# Patient Record
Sex: Female | Born: 1950 | Race: White | Hispanic: No | Marital: Married | Smoking: Never smoker
Health system: Southern US, Community
[De-identification: ages and names within clinical notes are randomized; demographics above are authoritative.]

## PROBLEM LIST (undated history)

## (undated) DIAGNOSIS — E785 Hyperlipidemia, unspecified: Secondary | ICD-10-CM

## (undated) DIAGNOSIS — F419 Anxiety disorder, unspecified: Secondary | ICD-10-CM

## (undated) DIAGNOSIS — S93409A Sprain of unspecified ligament of unspecified ankle, initial encounter: Secondary | ICD-10-CM

## (undated) DIAGNOSIS — F329 Major depressive disorder, single episode, unspecified: Secondary | ICD-10-CM

## (undated) DIAGNOSIS — S52509A Unspecified fracture of the lower end of unspecified radius, initial encounter for closed fracture: Secondary | ICD-10-CM

## (undated) DIAGNOSIS — R7303 Prediabetes: Secondary | ICD-10-CM

## (undated) DIAGNOSIS — G4733 Obstructive sleep apnea (adult) (pediatric): Secondary | ICD-10-CM

## (undated) DIAGNOSIS — Z923 Personal history of irradiation: Secondary | ICD-10-CM

## (undated) DIAGNOSIS — J189 Pneumonia, unspecified organism: Secondary | ICD-10-CM

## (undated) DIAGNOSIS — E119 Type 2 diabetes mellitus without complications: Secondary | ICD-10-CM

## (undated) DIAGNOSIS — Z1379 Encounter for other screening for genetic and chromosomal anomalies: Secondary | ICD-10-CM

## (undated) DIAGNOSIS — R51 Headache: Secondary | ICD-10-CM

## (undated) DIAGNOSIS — F32A Depression, unspecified: Secondary | ICD-10-CM

## (undated) DIAGNOSIS — Z972 Presence of dental prosthetic device (complete) (partial): Secondary | ICD-10-CM

## (undated) DIAGNOSIS — M1990 Unspecified osteoarthritis, unspecified site: Secondary | ICD-10-CM

## (undated) DIAGNOSIS — K8689 Other specified diseases of pancreas: Secondary | ICD-10-CM

## (undated) DIAGNOSIS — E538 Deficiency of other specified B group vitamins: Secondary | ICD-10-CM

## (undated) DIAGNOSIS — R5382 Chronic fatigue, unspecified: Secondary | ICD-10-CM

## (undated) DIAGNOSIS — G4730 Sleep apnea, unspecified: Secondary | ICD-10-CM

## (undated) DIAGNOSIS — E039 Hypothyroidism, unspecified: Secondary | ICD-10-CM

## (undated) DIAGNOSIS — K219 Gastro-esophageal reflux disease without esophagitis: Secondary | ICD-10-CM

## (undated) DIAGNOSIS — Z8719 Personal history of other diseases of the digestive system: Secondary | ICD-10-CM

## (undated) DIAGNOSIS — R519 Headache, unspecified: Secondary | ICD-10-CM

## (undated) DIAGNOSIS — F909 Attention-deficit hyperactivity disorder, unspecified type: Secondary | ICD-10-CM

## (undated) DIAGNOSIS — C50919 Malignant neoplasm of unspecified site of unspecified female breast: Secondary | ICD-10-CM

## (undated) DIAGNOSIS — Z79899 Other long term (current) drug therapy: Secondary | ICD-10-CM

## (undated) DIAGNOSIS — D649 Anemia, unspecified: Secondary | ICD-10-CM

## (undated) DIAGNOSIS — K529 Noninfective gastroenteritis and colitis, unspecified: Secondary | ICD-10-CM

## (undated) HISTORY — DX: Malignant neoplasm of unspecified site of unspecified female breast: C50.919

## (undated) HISTORY — PX: DILATION AND CURETTAGE OF UTERUS: SHX78

## (undated) HISTORY — PX: SINUS SURGERY WITH INSTATRAK: SHX5215

## (undated) HISTORY — PX: COLONOSCOPY: SHX174

## (undated) HISTORY — DX: Anemia, unspecified: D64.9

## (undated) HISTORY — DX: Encounter for other screening for genetic and chromosomal anomalies: Z13.79

## (undated) HISTORY — DX: Attention-deficit hyperactivity disorder, unspecified type: F90.9

## (undated) HISTORY — DX: Type 2 diabetes mellitus without complications: E11.9

---

## 1975-05-02 HISTORY — PX: APPENDECTOMY: SHX54

## 1981-05-01 HISTORY — PX: ABDOMINAL HYSTERECTOMY: SHX81

## 1989-05-01 HISTORY — PX: PATELLA FRACTURE SURGERY: SHX735

## 2004-04-04 ENCOUNTER — Emergency Department: Payer: Self-pay | Admitting: Emergency Medicine

## 2004-09-15 ENCOUNTER — Ambulatory Visit: Payer: Self-pay | Admitting: Family Medicine

## 2004-10-11 ENCOUNTER — Ambulatory Visit: Payer: Self-pay | Admitting: Otolaryngology

## 2005-06-06 ENCOUNTER — Other Ambulatory Visit: Payer: Self-pay

## 2005-06-07 ENCOUNTER — Inpatient Hospital Stay: Payer: Self-pay | Admitting: Internal Medicine

## 2005-06-09 ENCOUNTER — Other Ambulatory Visit: Payer: Self-pay

## 2005-06-27 ENCOUNTER — Encounter: Admission: RE | Admit: 2005-06-27 | Discharge: 2005-06-27 | Payer: Self-pay | Admitting: Neurology

## 2005-09-13 ENCOUNTER — Ambulatory Visit: Payer: Self-pay | Admitting: Family Medicine

## 2006-09-18 ENCOUNTER — Ambulatory Visit: Payer: Self-pay | Admitting: Family Medicine

## 2007-08-22 ENCOUNTER — Emergency Department: Payer: Self-pay | Admitting: Unknown Physician Specialty

## 2008-02-27 ENCOUNTER — Ambulatory Visit: Payer: Self-pay | Admitting: Family Medicine

## 2009-10-14 ENCOUNTER — Ambulatory Visit: Payer: Self-pay | Admitting: Family Medicine

## 2010-03-15 ENCOUNTER — Ambulatory Visit: Payer: Self-pay | Admitting: Gastroenterology

## 2011-01-07 ENCOUNTER — Emergency Department: Payer: Self-pay | Admitting: Emergency Medicine

## 2012-04-11 ENCOUNTER — Emergency Department: Payer: Self-pay | Admitting: Emergency Medicine

## 2013-10-06 DIAGNOSIS — R413 Other amnesia: Secondary | ICD-10-CM | POA: Insufficient documentation

## 2013-10-06 DIAGNOSIS — R269 Unspecified abnormalities of gait and mobility: Secondary | ICD-10-CM | POA: Insufficient documentation

## 2013-10-06 DIAGNOSIS — R4701 Aphasia: Secondary | ICD-10-CM | POA: Insufficient documentation

## 2013-10-20 ENCOUNTER — Ambulatory Visit: Payer: Self-pay | Admitting: Neurology

## 2014-04-06 ENCOUNTER — Encounter: Payer: Self-pay | Admitting: Neurology

## 2014-05-01 ENCOUNTER — Encounter: Payer: Self-pay | Admitting: Neurology

## 2014-06-01 ENCOUNTER — Encounter: Payer: Self-pay | Admitting: Neurology

## 2014-06-30 ENCOUNTER — Encounter
Admit: 2014-06-30 | Disposition: A | Discharge status: Home / Self Care | Payer: Self-pay | Attending: Neurology | Admitting: Neurology

## 2014-07-17 DIAGNOSIS — M25512 Pain in left shoulder: Secondary | ICD-10-CM | POA: Insufficient documentation

## 2014-08-25 DIAGNOSIS — E538 Deficiency of other specified B group vitamins: Secondary | ICD-10-CM | POA: Insufficient documentation

## 2014-08-26 DIAGNOSIS — M7502 Adhesive capsulitis of left shoulder: Secondary | ICD-10-CM | POA: Insufficient documentation

## 2014-08-26 DIAGNOSIS — M7581 Other shoulder lesions, right shoulder: Secondary | ICD-10-CM | POA: Insufficient documentation

## 2014-08-28 ENCOUNTER — Other Ambulatory Visit: Payer: Self-pay | Admitting: Surgery

## 2014-08-28 DIAGNOSIS — M7582 Other shoulder lesions, left shoulder: Principal | ICD-10-CM

## 2014-08-28 DIAGNOSIS — M75122 Complete rotator cuff tear or rupture of left shoulder, not specified as traumatic: Secondary | ICD-10-CM

## 2014-08-28 DIAGNOSIS — M7502 Adhesive capsulitis of left shoulder: Secondary | ICD-10-CM

## 2014-08-28 DIAGNOSIS — M778 Other enthesopathies, not elsewhere classified: Secondary | ICD-10-CM

## 2014-09-03 ENCOUNTER — Ambulatory Visit
Admission: RE | Admit: 2014-09-03 | Discharge: 2014-09-03 | Disposition: A | Discharge status: Home / Self Care | Payer: BLUE CROSS/BLUE SHIELD | Source: Ambulatory Visit | Attending: Surgery | Admitting: Surgery

## 2014-09-03 DIAGNOSIS — M778 Other enthesopathies, not elsewhere classified: Secondary | ICD-10-CM

## 2014-09-03 DIAGNOSIS — M7582 Other shoulder lesions, left shoulder: Secondary | ICD-10-CM | POA: Diagnosis present

## 2014-09-03 DIAGNOSIS — M75122 Complete rotator cuff tear or rupture of left shoulder, not specified as traumatic: Secondary | ICD-10-CM

## 2014-09-03 DIAGNOSIS — M7552 Bursitis of left shoulder: Secondary | ICD-10-CM | POA: Diagnosis not present

## 2014-09-03 DIAGNOSIS — M7502 Adhesive capsulitis of left shoulder: Secondary | ICD-10-CM

## 2015-01-01 ENCOUNTER — Other Ambulatory Visit: Payer: Self-pay | Admitting: Family Medicine

## 2015-01-01 DIAGNOSIS — Z780 Asymptomatic menopausal state: Secondary | ICD-10-CM

## 2015-01-05 ENCOUNTER — Other Ambulatory Visit: Payer: Self-pay | Admitting: Family Medicine

## 2015-01-05 DIAGNOSIS — Z1231 Encounter for screening mammogram for malignant neoplasm of breast: Secondary | ICD-10-CM

## 2015-01-13 ENCOUNTER — Ambulatory Visit
Admission: RE | Admit: 2015-01-13 | Discharge: 2015-01-13 | Disposition: A | Discharge status: Home / Self Care | Payer: BLUE CROSS/BLUE SHIELD | Source: Ambulatory Visit | Attending: Family Medicine | Admitting: Family Medicine

## 2015-01-13 DIAGNOSIS — Z13820 Encounter for screening for osteoporosis: Secondary | ICD-10-CM | POA: Diagnosis present

## 2015-01-13 DIAGNOSIS — M8580 Other specified disorders of bone density and structure, unspecified site: Secondary | ICD-10-CM | POA: Insufficient documentation

## 2015-01-13 DIAGNOSIS — Z1231 Encounter for screening mammogram for malignant neoplasm of breast: Secondary | ICD-10-CM | POA: Insufficient documentation

## 2015-01-13 DIAGNOSIS — Z780 Asymptomatic menopausal state: Secondary | ICD-10-CM | POA: Diagnosis present

## 2015-02-05 ENCOUNTER — Ambulatory Visit
Admission: RE | Admit: 2015-02-05 | Discharge: 2015-02-05 | Disposition: A | Discharge status: Home / Self Care | Payer: BLUE CROSS/BLUE SHIELD | Source: Ambulatory Visit | Attending: Gastroenterology | Admitting: Gastroenterology

## 2015-02-05 ENCOUNTER — Encounter: Payer: Self-pay | Admitting: Anesthesiology

## 2015-02-05 ENCOUNTER — Encounter
Admission: RE | Disposition: A | Discharge status: Home / Self Care | Payer: Self-pay | Source: Ambulatory Visit | Attending: Gastroenterology

## 2015-02-05 ENCOUNTER — Ambulatory Visit: Payer: BLUE CROSS/BLUE SHIELD | Admitting: Anesthesiology

## 2015-02-05 DIAGNOSIS — E785 Hyperlipidemia, unspecified: Secondary | ICD-10-CM | POA: Diagnosis not present

## 2015-02-05 DIAGNOSIS — Z803 Family history of malignant neoplasm of breast: Secondary | ICD-10-CM | POA: Diagnosis not present

## 2015-02-05 DIAGNOSIS — F329 Major depressive disorder, single episode, unspecified: Secondary | ICD-10-CM | POA: Insufficient documentation

## 2015-02-05 DIAGNOSIS — E538 Deficiency of other specified B group vitamins: Secondary | ICD-10-CM | POA: Diagnosis not present

## 2015-02-05 DIAGNOSIS — Z91041 Radiographic dye allergy status: Secondary | ICD-10-CM | POA: Insufficient documentation

## 2015-02-05 DIAGNOSIS — Z881 Allergy status to other antibiotic agents status: Secondary | ICD-10-CM | POA: Diagnosis not present

## 2015-02-05 DIAGNOSIS — K210 Gastro-esophageal reflux disease with esophagitis: Secondary | ICD-10-CM | POA: Insufficient documentation

## 2015-02-05 DIAGNOSIS — Z882 Allergy status to sulfonamides status: Secondary | ICD-10-CM | POA: Insufficient documentation

## 2015-02-05 DIAGNOSIS — G4730 Sleep apnea, unspecified: Secondary | ICD-10-CM | POA: Diagnosis not present

## 2015-02-05 DIAGNOSIS — K2970 Gastritis, unspecified, without bleeding: Secondary | ICD-10-CM | POA: Insufficient documentation

## 2015-02-05 DIAGNOSIS — Z90710 Acquired absence of both cervix and uterus: Secondary | ICD-10-CM | POA: Insufficient documentation

## 2015-02-05 DIAGNOSIS — Z885 Allergy status to narcotic agent status: Secondary | ICD-10-CM | POA: Diagnosis not present

## 2015-02-05 DIAGNOSIS — R1310 Dysphagia, unspecified: Secondary | ICD-10-CM | POA: Diagnosis present

## 2015-02-05 DIAGNOSIS — E039 Hypothyroidism, unspecified: Secondary | ICD-10-CM | POA: Insufficient documentation

## 2015-02-05 DIAGNOSIS — K449 Diaphragmatic hernia without obstruction or gangrene: Secondary | ICD-10-CM | POA: Diagnosis not present

## 2015-02-05 HISTORY — DX: Sleep apnea, unspecified: G47.30

## 2015-02-05 HISTORY — PX: SAVORY DILATION: SHX5439

## 2015-02-05 HISTORY — DX: Headache, unspecified: R51.9

## 2015-02-05 HISTORY — DX: Hyperlipidemia, unspecified: E78.5

## 2015-02-05 HISTORY — PX: ESOPHAGOGASTRODUODENOSCOPY (EGD) WITH PROPOFOL: SHX5813

## 2015-02-05 HISTORY — DX: Deficiency of other specified B group vitamins: E53.8

## 2015-02-05 HISTORY — DX: Depression, unspecified: F32.A

## 2015-02-05 HISTORY — DX: Gastro-esophageal reflux disease without esophagitis: K21.9

## 2015-02-05 HISTORY — DX: Major depressive disorder, single episode, unspecified: F32.9

## 2015-02-05 HISTORY — DX: Headache: R51

## 2015-02-05 HISTORY — DX: Hypothyroidism, unspecified: E03.9

## 2015-02-05 SURGERY — ESOPHAGOGASTRODUODENOSCOPY (EGD) WITH PROPOFOL
Anesthesia: General

## 2015-02-05 MED ORDER — MIDAZOLAM HCL 2 MG/2ML IJ SOLN
INTRAMUSCULAR | Status: DC | PRN
Start: 2015-02-05 — End: 2015-02-05
  Administered 2015-02-05: 1 mg via INTRAVENOUS

## 2015-02-05 MED ORDER — GLYCOPYRROLATE 0.2 MG/ML IJ SOLN
INTRAMUSCULAR | Status: DC | PRN
  Administered 2015-02-05: 0.1 mg via INTRAVENOUS

## 2015-02-05 MED ORDER — FENTANYL CITRATE (PF) 100 MCG/2ML IJ SOLN
INTRAMUSCULAR | Status: DC | PRN
  Administered 2015-02-05: 50 ug via INTRAVENOUS

## 2015-02-05 MED ORDER — SODIUM CHLORIDE 0.9 % IV SOLN
INTRAVENOUS | Status: DC
Start: 2015-02-05 — End: 2015-02-05
  Administered 2015-02-05: 11:00:00 via INTRAVENOUS

## 2015-02-05 MED ORDER — LIDOCAINE HCL (CARDIAC) 20 MG/ML IV SOLN
INTRAVENOUS | Status: DC | PRN
  Administered 2015-02-05: 50 mg via INTRAVENOUS

## 2015-02-05 MED ORDER — PROPOFOL 500 MG/50ML IV EMUL
INTRAVENOUS | Status: DC | PRN
  Administered 2015-02-05: 120 ug/kg/min via INTRAVENOUS

## 2015-02-05 MED ORDER — EPHEDRINE SULFATE 50 MG/ML IJ SOLN
INTRAMUSCULAR | Status: DC | PRN
  Administered 2015-02-05: 5 mg via INTRAVENOUS

## 2015-02-05 NOTE — Op Note (Signed)
[facility] Patient Name: April Leblanc Procedure Date: 02/05/2015 11:17 AM MRN: [id number] Account #: [id number] Date of Birth: Aug 15, 1950 Admit Type: Outpatient Age: 64 Room: [facility] ENDO ROOM 2 Gender: Female Note Status: Finalized Procedure:         Upper GI endoscopy Indications:       Dysphagia, Heartburn, Suspected esophageal reflux Patient Profile:   This is a 64 year old female. Providers:         Gerrit Heck. Rayann Heman, MD Referring MD:      Dr Celesta Gentile Medicines:         Propofol per Anesthesia Complications:     No immediate complications. Procedure:         Pre-Anesthesia Assessment:                    - Prior to the procedure, a History and Physical was                     performed, and patient medications, allergies and                     sensitivities were reviewed. The patient's tolerance of                     previous anesthesia was reviewed.                    After obtaining informed consent, the endoscope was passed                     under direct vision. Throughout the procedure, the                     patient's blood pressure, pulse, and oxygen saturations                     were monitored continuously. The Endoscope was introduced                     through the mouth, and advanced to the second part of                     duodenum. The upper GI endoscopy was accomplished without                     difficulty. The patient tolerated the procedure well. Findings:      A medium-sized hiatus hernia was present.      LA Grade A (one or more mucosal breaks less than 5 mm, not extending       between tops of 2 mucosal folds) esophagitis was found at the       gastroesophageal junction. Biopsies were taken with a cold forceps for       histology.      Localized mild mucosal abnormality characterized by discoloration and       granularity was found in the upper third of the esophagus. Biopsies were       taken with a cold  forceps for histology.      The stomach was normal.      The examined duodenum was normal. Impression:        - Medium-sized hiatus hernia.                    - LA Grade A reflux esophagitis. Biopsied.                    -  Discolored, granular mucosa in the esophagus. Biopsied.                    - Normal stomach.                    - Normal examined duodenum. Recommendation:    - Observe patient in GI recovery unit.                    - Resume regular diet.                    - Continue present medications.                    - Await pathology results.                    - Return to GI clinic.                    - Start zantac 150 mg at bedtime                    - Obtain esophageal manometry and 24 hour pH if surgical                     repair of hiatal hernia is being considered.                    - The findings and recommendations were discussed with the                     patient.                    - The findings and recommendations were discussed with the                     patient's family. Procedure Code(s): --- Professional ---                    [phone], Esophagogastroduodenoscopy, flexible, transoral;                     with biopsy, single or multiple CPT copyright 2014 American Medical Association. All rights reserved. The codes documented in this report are preliminary and upon coder review may  be revised to meet current compliance requirements. Mellody Life, MD 02/05/2015 11:37:14 AM This report has been signed electronically. Number of Addenda: 0 Note Initiated On: 02/05/2015 11:17 AM      [facility]

## 2015-02-05 NOTE — Anesthesia Postprocedure Evaluation (Signed)
  Anesthesia Post-op Note  Patient: April Leblanc  Procedure(s) Performed: Procedure(s): ESOPHAGOGASTRODUODENOSCOPY (EGD) WITH PROPOFOL (N/A) SAVORY DILATION (N/A)  Anesthesia type:General  Patient location: PACU  Post pain: Pain level controlled  Post assessment: Post-op Vital signs reviewed, Patient's Cardiovascular Status Stable, Respiratory Function Stable, Patent Airway and No signs of Nausea or vomiting  Post vital signs: Reviewed and stable  Last Vitals:  Filed Vitals:   02/05/15 1210  BP: 119/79  Pulse: 94  Temp:   Resp: 16    Level of consciousness: awake, alert  and patient cooperative  Complications: No apparent anesthesia complications

## 2015-02-05 NOTE — Anesthesia Procedure Notes (Signed)
Performed by: COOK-MARTIN, Lonell Stamos Pre-anesthesia Checklist: Patient identified, Emergency Drugs available, Suction available, Patient being monitored and Timeout performed Patient Re-evaluated:Patient Re-evaluated prior to inductionOxygen Delivery Method: Nasal cannula Preoxygenation: Pre-oxygenation with 100% oxygen Intubation Type: IV induction Airway Equipment and Method: Bite block Placement Confirmation: CO2 detector and positive ETCO2     

## 2015-02-05 NOTE — H&P (Signed)
  Primary Care Physician:  Chrisandra Carota, MD  Pre-Procedure History & Physical: HPI:  April Leblanc is a 64 y.o. female is here for an endoscopy.   Past Medical History  Diagnosis Date  . Hypothyroidism   . Sleep apnea   . GERD (gastroesophageal reflux disease)   . Headache   . Depression   . Vitamin B 12 deficiency   . Hyperlipidemia     Past Surgical History  Procedure Laterality Date  . Abdominal hysterectomy    . Colonoscopy    . Sinus surgery with instatrak    . Cesarean section      Prior to Admission medications   Medication Sig Start Date End Date Taking? Authorizing Provider  atorvastatin (LIPITOR) 40 MG tablet Take 40 mg by mouth daily.   Yes Historical Provider, MD  esomeprazole (NEXIUM) 20 MG capsule Take 20 mg by mouth daily at 12 noon.   Yes Historical Provider, MD  Eszopiclone 3 MG TABS Take 3 mg by mouth at bedtime. Take immediately before bedtime   Yes Historical Provider, MD  fluticasone (FLONASE) 50 MCG/ACT nasal spray Place 2 sprays into both nostrils daily.   Yes Historical Provider, MD  levothyroxine (SYNTHROID, LEVOTHROID) 137 MCG tablet Take 137 mcg by mouth daily before breakfast.   Yes Historical Provider, MD  montelukast (SINGULAIR) 10 MG tablet Take 10 mg by mouth at bedtime.   Yes Historical Provider, MD  oxybutynin (DITROPAN-XL) 10 MG 24 hr tablet Take 10 mg by mouth at bedtime.   Yes Historical Provider, MD  Vortioxetine HBr (TRINTELLIX) 20 MG TABS Take 20 mg by mouth daily.   Yes Historical Provider, MD    Allergies as of 01/06/2015  . (Not on File)    Family History  Problem Relation Age of Onset  . Breast cancer Maternal Aunt   . Breast cancer Cousin   . Breast cancer Cousin     Social History   Social History  . Marital Status: Married    Spouse Name: N/A  . Number of Children: N/A  . Years of Education: N/A   Occupational History  . Not on file.   Social History Main Topics  . Smoking status: Never Smoker   .  Smokeless tobacco: Not on file  . Alcohol Use: Not on file  . Drug Use: Not on file  . Sexual Activity: Not on file   Other Topics Concern  . Not on file   Social History Narrative     Physical Exam: BP 134/88 mmHg  Pulse 77  Temp(Src) 98.1 F (36.7 C) (Oral)  Resp 17  Ht 5\' 7"  (1.702 m)  Wt 79.833 kg (176 lb)  BMI 27.56 kg/m2  SpO2 99% General:   Alert,  pleasant and cooperative in NAD Head:  Normocephalic and atraumatic. Neck:  Supple; no masses or thyromegaly. Lungs:  Clear throughout to auscultation.    Heart:  Regular rate and rhythm. Abdomen:  Soft, nontender and nondistended. Normal bowel sounds, without guarding, and without rebound.   Neurologic:  Alert and  oriented x4;  grossly normal neurologically.  Impression/Plan: April Leblanc is here for an endoscopy to be performed for GERD, dysphagia  Risks, benefits, limitations, and alternatives regarding  endoscopy have been reviewed with the patient.  Questions have been answered.  All parties agreeable.   Josefine Class, MD  02/05/2015, 11:16 AM

## 2015-02-05 NOTE — Discharge Instructions (Signed)

## 2015-02-05 NOTE — Anesthesia Preprocedure Evaluation (Signed)
Anesthesia Evaluation  Patient identified by MRN, date of birth, ID band Patient awake    Reviewed: Allergy & Precautions, H&P , NPO status , Patient's Chart, lab work & pertinent test results  History of Anesthesia Complications Negative for: history of anesthetic complications  Airway Mallampati: III  TM Distance: >3 FB Neck ROM: limited    Dental  (+) Poor Dentition, Chipped, Missing, Partial Lower   Pulmonary neg shortness of breath, sleep apnea ,    Pulmonary exam normal breath sounds clear to auscultation       Cardiovascular Exercise Tolerance: Good (-) Past MI negative cardio ROS Normal cardiovascular exam Rhythm:regular Rate:Normal     Neuro/Psych  Headaches, PSYCHIATRIC DISORDERS Depression    GI/Hepatic Neg liver ROS, GERD  ,  Endo/Other  Hypothyroidism   Renal/GU negative Renal ROS  negative genitourinary   Musculoskeletal   Abdominal   Peds  Hematology negative hematology ROS (+)   Anesthesia Other Findings Past Medical History:   Hypothyroidism                                               Sleep apnea                                                  GERD (gastroesophageal reflux disease)                       Headache                                                     Depression                                                   Vitamin B 12 deficiency                                      Hyperlipidemia                                              BMI    Body Mass Index   27.55 kg/m 2      Reproductive/Obstetrics negative OB ROS                             Anesthesia Physical Anesthesia Plan  ASA: III  Anesthesia Plan: General   Post-op Pain Management:    Induction:   Airway Management Planned:   Additional Equipment:   Intra-op Plan:   Post-operative Plan:   Informed Consent: I have reviewed the patients History and Physical, chart, labs and  discussed the procedure including the risks, benefits and alternatives for the proposed anesthesia with  the patient or authorized representative who has indicated his/her understanding and acceptance.   Dental Advisory Given  Plan Discussed with: Anesthesiologist, CRNA and Surgeon  Anesthesia Plan Comments:         Anesthesia Quick Evaluation

## 2015-02-05 NOTE — Transfer of Care (Signed)
Immediate Anesthesia Transfer of Care Note  Patient: April Leblanc  Procedure(s) Performed: Procedure(s): ESOPHAGOGASTRODUODENOSCOPY (EGD) WITH PROPOFOL (N/A) SAVORY DILATION (N/A)  Patient Location: PACU  Anesthesia Type:General  Level of Consciousness: awake, alert , oriented and sedated  Airway & Oxygen Therapy: Patient Spontanous Breathing and Patient connected to nasal cannula oxygen  Post-op Assessment: Report given to RN and Post -op Vital signs reviewed and stable  Post vital signs: Reviewed and stable  Last Vitals:  Filed Vitals:   02/05/15 1139  BP: 101/56  Pulse:   Temp: 35.9 C  Resp: 13    Complications: No apparent anesthesia complications

## 2015-02-08 ENCOUNTER — Encounter: Payer: Self-pay | Admitting: Gastroenterology

## 2015-02-08 LAB — SURGICAL PATHOLOGY

## 2015-06-30 DIAGNOSIS — R531 Weakness: Secondary | ICD-10-CM | POA: Insufficient documentation

## 2016-02-05 DIAGNOSIS — N3946 Mixed incontinence: Secondary | ICD-10-CM | POA: Insufficient documentation

## 2016-05-01 DIAGNOSIS — C50919 Malignant neoplasm of unspecified site of unspecified female breast: Secondary | ICD-10-CM

## 2016-05-01 HISTORY — DX: Malignant neoplasm of unspecified site of unspecified female breast: C50.919

## 2017-02-06 ENCOUNTER — Other Ambulatory Visit: Payer: Self-pay | Admitting: Family Medicine

## 2017-02-06 DIAGNOSIS — Z1239 Encounter for other screening for malignant neoplasm of breast: Secondary | ICD-10-CM

## 2017-02-21 ENCOUNTER — Ambulatory Visit
Admission: RE | Admit: 2017-02-21 | Discharge: 2017-02-21 | Disposition: A | Discharge status: Home / Self Care | Payer: Medicare Other | Source: Ambulatory Visit | Attending: Family Medicine | Admitting: Family Medicine

## 2017-02-21 DIAGNOSIS — R928 Other abnormal and inconclusive findings on diagnostic imaging of breast: Secondary | ICD-10-CM | POA: Diagnosis not present

## 2017-02-21 DIAGNOSIS — Z1239 Encounter for other screening for malignant neoplasm of breast: Secondary | ICD-10-CM

## 2017-02-21 DIAGNOSIS — Z1231 Encounter for screening mammogram for malignant neoplasm of breast: Secondary | ICD-10-CM | POA: Insufficient documentation

## 2017-02-28 ENCOUNTER — Other Ambulatory Visit: Payer: Self-pay | Admitting: Family Medicine

## 2017-02-28 DIAGNOSIS — N6320 Unspecified lump in the left breast, unspecified quadrant: Secondary | ICD-10-CM

## 2017-02-28 DIAGNOSIS — N6489 Other specified disorders of breast: Secondary | ICD-10-CM

## 2017-02-28 DIAGNOSIS — R928 Other abnormal and inconclusive findings on diagnostic imaging of breast: Secondary | ICD-10-CM

## 2017-03-01 DIAGNOSIS — S52509A Unspecified fracture of the lower end of unspecified radius, initial encounter for closed fracture: Secondary | ICD-10-CM

## 2017-03-01 HISTORY — DX: Unspecified fracture of the lower end of unspecified radius, initial encounter for closed fracture: S52.509A

## 2017-03-14 ENCOUNTER — Ambulatory Visit
Admission: RE | Admit: 2017-03-14 | Discharge: 2017-03-14 | Disposition: A | Discharge status: Home / Self Care | Payer: Medicare Other | Source: Ambulatory Visit | Attending: Family Medicine | Admitting: Family Medicine

## 2017-03-14 DIAGNOSIS — N6489 Other specified disorders of breast: Secondary | ICD-10-CM | POA: Insufficient documentation

## 2017-03-14 DIAGNOSIS — R928 Other abnormal and inconclusive findings on diagnostic imaging of breast: Secondary | ICD-10-CM

## 2017-03-14 DIAGNOSIS — N6322 Unspecified lump in the left breast, upper inner quadrant: Secondary | ICD-10-CM | POA: Diagnosis not present

## 2017-03-14 DIAGNOSIS — N6320 Unspecified lump in the left breast, unspecified quadrant: Secondary | ICD-10-CM

## 2017-03-19 ENCOUNTER — Other Ambulatory Visit: Payer: Self-pay | Admitting: Family Medicine

## 2017-03-19 DIAGNOSIS — N6320 Unspecified lump in the left breast, unspecified quadrant: Secondary | ICD-10-CM

## 2017-03-19 DIAGNOSIS — R928 Other abnormal and inconclusive findings on diagnostic imaging of breast: Secondary | ICD-10-CM

## 2017-03-20 ENCOUNTER — Encounter: Payer: Self-pay | Admitting: Emergency Medicine

## 2017-03-20 ENCOUNTER — Other Ambulatory Visit: Payer: Self-pay

## 2017-03-20 DIAGNOSIS — Y93K1 Activity, walking an animal: Secondary | ICD-10-CM | POA: Insufficient documentation

## 2017-03-20 DIAGNOSIS — W010XXA Fall on same level from slipping, tripping and stumbling without subsequent striking against object, initial encounter: Secondary | ICD-10-CM | POA: Insufficient documentation

## 2017-03-20 DIAGNOSIS — E039 Hypothyroidism, unspecified: Secondary | ICD-10-CM | POA: Insufficient documentation

## 2017-03-20 DIAGNOSIS — Y929 Unspecified place or not applicable: Secondary | ICD-10-CM | POA: Insufficient documentation

## 2017-03-20 DIAGNOSIS — S99912A Unspecified injury of left ankle, initial encounter: Secondary | ICD-10-CM | POA: Insufficient documentation

## 2017-03-20 DIAGNOSIS — Y999 Unspecified external cause status: Secondary | ICD-10-CM | POA: Insufficient documentation

## 2017-03-20 DIAGNOSIS — Z79899 Other long term (current) drug therapy: Secondary | ICD-10-CM | POA: Insufficient documentation

## 2017-03-20 NOTE — ED Notes (Signed)
Pt tripped and fell earlier today and saw ortho for injured left wrist and left foot. All xrays were negative, has splint to wrist and boot to left foot but was not discharged home with pain meds. Pt here for persistent pain to left foot.

## 2017-03-20 NOTE — ED Triage Notes (Signed)
Pt to triage via w/c with no distress noted; pt reports falling today after walking dog; c/o pain left ankle and wrist; seen at urgent care today and xrays were normal but pain persists to left foot; ace wrap in place to left FA and boot in place to left foot

## 2017-03-21 ENCOUNTER — Emergency Department
Admission: EM | Admit: 2017-03-21 | Discharge: 2017-03-21 | Disposition: A | Discharge status: Home / Self Care | Payer: Medicare Other | Attending: Emergency Medicine | Admitting: Emergency Medicine

## 2017-03-21 DIAGNOSIS — S99912A Unspecified injury of left ankle, initial encounter: Secondary | ICD-10-CM

## 2017-03-21 MED ORDER — OXYCODONE-ACETAMINOPHEN 5-325 MG PO TABS: 1.0000 | ORAL_TABLET | ORAL | 0 refills | Status: DC | PRN

## 2017-03-21 MED ORDER — ONDANSETRON 4 MG PO TBDP
ORAL_TABLET | ORAL | Status: AC
  Filled 2017-03-21: qty 1

## 2017-03-21 MED ORDER — LIDOCAINE 5 % EX PTCH: 1.0000 | MEDICATED_PATCH | CUTANEOUS | 0 refills | Status: DC

## 2017-03-21 MED ORDER — OXYCODONE-ACETAMINOPHEN 5-325 MG PO TABS
1.0000 | ORAL_TABLET | Freq: Once | ORAL | Status: AC
Start: 2017-03-21 — End: 2017-03-21
  Administered 2017-03-21: 1 via ORAL
  Filled 2017-03-21: qty 1

## 2017-03-21 MED ORDER — ONDANSETRON 4 MG PO TBDP
4.0000 mg | ORAL_TABLET | Freq: Once | ORAL | Status: AC
  Administered 2017-03-21: 4 mg via ORAL

## 2017-03-21 NOTE — ED Provider Notes (Signed)
[facility] Emergency Department Provider Note    First MD Initiated Contact with Patient 03/21/17 0115     (approximate)  I have reviewed the triage vital signs and the nursing notes.   HISTORY  Chief Complaint Fall   HPI April Leblanc is a 66 y.o. female with the below list of chronic medical conditions presents to the emergency department with history of accidental fall today while walking her dog.  Patient states that [facility] where x-[facility] were performed of the left ankle and left arm with no fracture noted.  Patient states however that she has had continued and progressive pain of the left foot/ankle which prompted her visit to the emergency department tonight.  Patient states her current pain score is 9 out of 10.   Past Medical History:  Diagnosis Date  . Depression   . GERD (gastroesophageal reflux disease)   . Headache   . Hyperlipidemia   . Hypothyroidism   . Sleep apnea   . Vitamin B 12 deficiency     There are no active problems to display for this patient.   Past Surgical History:  Procedure Laterality Date  . ABDOMINAL HYSTERECTOMY    . CESAREAN SECTION    . COLONOSCOPY    . ESOPHAGOGASTRODUODENOSCOPY (EGD) WITH PROPOFOL N/A 02/05/2015   Procedure: ESOPHAGOGASTRODUODENOSCOPY (EGD) WITH PROPOFOL;  Surgeon: Josefine Class, MD;  Location: [facility] ENDOSCOPY;  Service: Endoscopy;  Laterality: N/A;  . SAVORY DILATION N/A 02/05/2015   Procedure: SAVORY DILATION;  Surgeon: Josefine Class, MD;  Location: [facility] ENDOSCOPY;  Service: Endoscopy;  Laterality: N/A;  . SINUS SURGERY WITH INSTATRAK      Prior to Admission medications   Medication Sig Start Date End Date Taking? Authorizing Provider  atorvastatin (LIPITOR) 40 MG tablet Take 40 mg by mouth daily.    [provider]  esomeprazole (NEXIUM) 20 MG capsule Take 20 mg by mouth daily at 12 noon.    [provider]  Eszopiclone 3 MG TABS Take  3 mg by mouth at bedtime. Take immediately before bedtime    [provider]  fluticasone (FLONASE) 50 MCG/ACT nasal spray Place 2 sprays into both nostrils daily.    [provider]  levothyroxine (SYNTHROID, LEVOTHROID) 137 MCG tablet Take 137 mcg by mouth daily before breakfast.    [provider]  montelukast (SINGULAIR) 10 MG tablet Take 10 mg by mouth at bedtime.    [provider]  oxybutynin (DITROPAN-XL) 10 MG 24 hr tablet Take 10 mg by mouth at bedtime.    [provider]  Vortioxetine HBr (TRINTELLIX) 20 MG TABS Take 20 mg by mouth daily.    [provider]    Allergies Amoxicillin; Erythromycin; Iodinated diagnostic agents; Morphine and related; and Sulfa antibiotics  Family History  Problem Relation Age of Onset  . Breast cancer Maternal Aunt   . Breast cancer Cousin   . Breast cancer Cousin     Social History Social History   Tobacco Use  . Smoking status: Never Smoker  . Smokeless tobacco: Never Used  Substance Use Topics  . Alcohol use: Not on file  . Drug use: Not on file    Review of Systems Constitutional: No fever/chills Eyes: No visual changes. ENT: No sore throat. Cardiovascular: Denies chest pain. Respiratory: Denies shortness of breath. Gastrointestinal: No abdominal pain.  No nausea, no vomiting.  No diarrhea.  No constipation. Genitourinary: Negative for dysuria. Musculoskeletal: Negative  for neck pain.  Negative for back pain. Integumentary: Negative for rash. Neurological: Negative for headaches, focal weakness or numbness.  ____________________________________________   PHYSICAL EXAM:  VITAL SIGNS: ED Triage Vitals [03/20/17 2218]  Enc Vitals Group     BP (!) 97/38     Pulse Rate 88     Resp 18     Temp 98.5 F (36.9 C)     Temp Source Oral     SpO2 97 %     Weight 72.6 kg (160 lb)     Height 1.702 m (5\' 7" )     Head Circumference      Peak Flow      Pain Score 8     Pain  Loc      Pain Edu?      Excl. in [place]?     Constitutional: Alert and oriented. Well appearing and in no acute distress. Eyes: Conjunctivae are normal.  Head: Atraumatic. Mouth/Throat: Mucous membranes are moist. Neck: No stridor.  Musculoskeletal: Left ankle pain with passive and active range of motion diffusely.  Pain to very gentle palpation lateral and medial malleoli Neurologic:  Normal speech and language. No gross focal neurologic deficits are appreciated.  Skin:  Skin is warm, dry and intact. No rash noted. Psychiatric: Mood and affect are normal. Speech and behavior are normal.      Procedures   ____________________________________________   INITIAL IMPRESSION / ASSESSMENT AND PLAN / ED COURSE  As part of my medical decision making, I reviewed the following data within the electronic MEDICAL RECORD [id number] year old female presenting with above-stated history and physical exam of left ankle pain status post fall.  Patient stated she did not want x-rays to be.  Patient given Percocet for pain and ice pack applied.  We will prescribed short course of Percocet and Lidoderm patch.  I advised the patient and I would not be able to tell if there is a fracture there as I do not have the capability of seen the x-rays from [facility] or so. ____________________________________________  FINAL CLINICAL IMPRESSION(S) / ED DIAGNOSES  Final diagnoses:  Left ankle injury, initial encounter     MEDICATIONS GIVEN DURING THIS VISIT:  Medications  ondansetron (ZOFRAN-ODT) 4 MG disintegrating tablet (not administered)  oxyCODONE-acetaminophen (PERCOCET/ROXICET) 5-325 MG per tablet 1 tablet (1 tablet Oral Given 03/21/17 0133)  ondansetron (ZOFRAN-ODT) disintegrating tablet 4 mg (4 mg Oral Given 03/21/17 0133)     ED Discharge Orders    None       Note:  This document was prepared using Dragon voice recognition software and may include unintentional dictation errors.    Gregor Hams, MD 03/21/17 [phone]

## 2017-03-27 ENCOUNTER — Ambulatory Visit
Admission: RE | Admit: 2017-03-27 | Discharge: 2017-03-27 | Disposition: A | Discharge status: Home / Self Care | Payer: Medicare Other | Source: Ambulatory Visit | Attending: Family Medicine | Admitting: Family Medicine

## 2017-03-27 DIAGNOSIS — C50912 Malignant neoplasm of unspecified site of left female breast: Secondary | ICD-10-CM | POA: Insufficient documentation

## 2017-03-27 DIAGNOSIS — N6320 Unspecified lump in the left breast, unspecified quadrant: Secondary | ICD-10-CM | POA: Diagnosis present

## 2017-03-27 DIAGNOSIS — R928 Other abnormal and inconclusive findings on diagnostic imaging of breast: Secondary | ICD-10-CM | POA: Insufficient documentation

## 2017-03-27 DIAGNOSIS — N6321 Unspecified lump in the left breast, upper outer quadrant: Secondary | ICD-10-CM | POA: Insufficient documentation

## 2017-03-27 HISTORY — PX: BREAST BIOPSY: SHX20

## 2017-03-29 ENCOUNTER — Other Ambulatory Visit: Payer: Self-pay | Admitting: Pathology

## 2017-03-30 ENCOUNTER — Encounter: Payer: Self-pay | Admitting: *Deleted

## 2017-03-30 NOTE — Progress Notes (Signed)
  Oncology Nurse Navigator Documentation  Navigator Location: [facility] (03/30/17 1100)   )Navigator Encounter Type: Introductory phone call (03/30/17 1100)   Abnormal Finding Date: 03/14/17 (03/30/17 1100) Confirmed Diagnosis Date: 03/29/17 (03/30/17 1100)                 Treatment Phase: Pre-Tx/Tx Discussion (03/30/17 1100) Barriers/Navigation Needs: Coordination of Care (03/30/17 1100)   Interventions: Coordination of Care (03/30/17 1100)   Coordination of Care: Appts (03/30/17 1100)                  Time Spent with Patient: 45 (03/30/17 1100)   Called patient to establish navigation services.  She is newly diagnosed with invasive breast cancer.  I have scheduled her to see Dr. Tamala Julian for surgical consultation on 04/04/17 @ 3:30.  Dianna in scheduling will call and schedule the patient for medical oncology consult since she states she has many medical appointments already.  She  currently has guardianship of 2 of her grandchildren ages 52 and 35.  Will take educational literature to Dr. Darliss Ridgel office for her to pick up.  She is to call if she has any questions or needs.

## 2017-03-31 DIAGNOSIS — S93409A Sprain of unspecified ligament of unspecified ankle, initial encounter: Secondary | ICD-10-CM

## 2017-03-31 DIAGNOSIS — R7303 Prediabetes: Secondary | ICD-10-CM

## 2017-03-31 HISTORY — DX: Sprain of unspecified ligament of unspecified ankle, initial encounter: S93.409A

## 2017-03-31 HISTORY — DX: Prediabetes: R73.03

## 2017-04-02 ENCOUNTER — Encounter: Payer: Self-pay | Admitting: Oncology

## 2017-04-02 ENCOUNTER — Inpatient Hospital Stay: Payer: Medicare Other | Attending: Oncology | Admitting: Oncology

## 2017-04-02 VITALS — BP 144/86 | HR 85 | Temp 97.2°F | Resp 18 | Ht 67.0 in | Wt 173.5 lb

## 2017-04-02 DIAGNOSIS — K219 Gastro-esophageal reflux disease without esophagitis: Secondary | ICD-10-CM | POA: Diagnosis not present

## 2017-04-02 DIAGNOSIS — M8588 Other specified disorders of bone density and structure, other site: Secondary | ICD-10-CM | POA: Insufficient documentation

## 2017-04-02 DIAGNOSIS — Z79899 Other long term (current) drug therapy: Secondary | ICD-10-CM

## 2017-04-02 DIAGNOSIS — C50412 Malignant neoplasm of upper-outer quadrant of left female breast: Secondary | ICD-10-CM | POA: Diagnosis present

## 2017-04-02 DIAGNOSIS — Z170 Estrogen receptor positive status [ER+]: Secondary | ICD-10-CM | POA: Diagnosis not present

## 2017-04-02 DIAGNOSIS — N6489 Other specified disorders of breast: Secondary | ICD-10-CM | POA: Diagnosis not present

## 2017-04-02 DIAGNOSIS — E785 Hyperlipidemia, unspecified: Secondary | ICD-10-CM | POA: Diagnosis not present

## 2017-04-02 DIAGNOSIS — Z880 Allergy status to penicillin: Secondary | ICD-10-CM | POA: Diagnosis not present

## 2017-04-02 DIAGNOSIS — R7303 Prediabetes: Secondary | ICD-10-CM | POA: Insufficient documentation

## 2017-04-02 DIAGNOSIS — Z803 Family history of malignant neoplasm of breast: Secondary | ICD-10-CM | POA: Diagnosis not present

## 2017-04-02 DIAGNOSIS — Z780 Asymptomatic menopausal state: Secondary | ICD-10-CM | POA: Insufficient documentation

## 2017-04-02 DIAGNOSIS — E039 Hypothyroidism, unspecified: Secondary | ICD-10-CM

## 2017-04-02 DIAGNOSIS — E538 Deficiency of other specified B group vitamins: Secondary | ICD-10-CM

## 2017-04-02 DIAGNOSIS — Z79811 Long term (current) use of aromatase inhibitors: Secondary | ICD-10-CM | POA: Insufficient documentation

## 2017-04-02 DIAGNOSIS — G4730 Sleep apnea, unspecified: Secondary | ICD-10-CM

## 2017-04-02 DIAGNOSIS — Z90710 Acquired absence of both cervix and uterus: Secondary | ICD-10-CM | POA: Diagnosis not present

## 2017-04-02 DIAGNOSIS — Z7189 Other specified counseling: Secondary | ICD-10-CM | POA: Insufficient documentation

## 2017-04-02 NOTE — Progress Notes (Signed)
Hematology/Oncology Consult note [facility] Telephone:([phone] [phone]  Patient Care Team: Clarisse Gouge, MD as PCP - General (Family Medicine)   Name of the patient: April Leblanc  [id number]  [birth year]    Reason for referral- breast cancer   Referring physician- Dr. Kirkland Hun  Date of visit: 04/02/17   History of presenting illness- Patient is a 66 year old female who underwent bilateral screening mammogram on 02/21/2017.  Mammogram showed possible asymmetry in the right breast and possible mass and distortion in the left breast.  Diagnostic bilateral mammogram showed suspicious mass in the left breast at 10 o'clock position 6 x 4 x 5 mm in size.  No evidence of left axillary adenopathy.  Asymmetry seen in the right breast resolved on Thoma centesis images and consistent with overlapping fibroglandular tissue.  Patient underwent ultrasound-guided core biopsy of the left breast lesion which showed invasive mammary carcinoma, grade 1.  Greater than 90% ER positive, 1% PR positive and HER-2 equivocal by IHC.  FISH testing for HER-2 is currently pending  She is G2 P2 L2.  Remote use of hormone contraception.  Menarche at the age of 55.  She had hysterectomy in her 7s.  No prior abnormal breast mammograms or breast biopsies.  Family history significant for prostate cancer in her brother.  Breast cancer in maternal aunt and 2 maternal first cousins.  She does not know if they have been tested for breast cancer   ECOG PS- 1  Pain scale- 0   Review of systems- Review of Systems  Constitutional: Negative for chills, fever, malaise/fatigue and weight loss.  HENT: Negative for congestion, ear discharge and nosebleeds.   Eyes: Negative for blurred vision.  Respiratory: Negative for cough, hemoptysis, sputum production, shortness of breath and wheezing.   Cardiovascular: Negative for chest pain, palpitations, orthopnea and claudication.    Gastrointestinal: Negative for abdominal pain, blood in stool, constipation, diarrhea, heartburn, melena, nausea and vomiting.  Genitourinary: Negative for dysuria, flank pain, frequency, hematuria and urgency.  Musculoskeletal: Negative for back pain, joint pain and myalgias.  Skin: Negative for rash.  Neurological: Negative for dizziness, tingling, focal weakness, seizures, weakness and headaches.  Endo/Heme/Allergies: Does not bruise/bleed easily.  Psychiatric/Behavioral: Negative for depression and suicidal ideas. The patient does not have insomnia.     Allergies  Allergen Reactions  . Amoxicillin   . Erythromycin   . Iodinated Diagnostic Agents   . Morphine And Related   . Sulfa Antibiotics     There are no active problems to display for this patient.    Past Medical History:  Diagnosis Date  . Depression   . GERD (gastroesophageal reflux disease)   . Headache   . Hyperlipidemia   . Hypothyroidism   . Sleep apnea   . Vitamin B 12 deficiency      Past Surgical History:  Procedure Laterality Date  . ABDOMINAL HYSTERECTOMY    . CESAREAN SECTION    . COLONOSCOPY    . ESOPHAGOGASTRODUODENOSCOPY (EGD) WITH PROPOFOL N/A 02/05/2015   Procedure: ESOPHAGOGASTRODUODENOSCOPY (EGD) WITH PROPOFOL;  Surgeon: Josefine Class, MD;  Location: [facility] ENDOSCOPY;  Service: Endoscopy;  Laterality: N/A;  . SAVORY DILATION N/A 02/05/2015   Procedure: SAVORY DILATION;  Surgeon: Josefine Class, MD;  Location: [facility] ENDOSCOPY;  Service: Endoscopy;  Laterality: N/A;  . SINUS SURGERY WITH INSTATRAK      Social History   Socioeconomic History  . Marital status: Married    Spouse name: Not on file  .  Number of children: Not on file  . Years of education: Not on file  . Highest education level: Not on file  Social Needs  . Financial resource strain: Not on file  . Food insecurity - worry: Not on file  . Food insecurity - inability: Not on file  . Transportation needs - medical:  Not on file  . Transportation needs - non-medical: Not on file  Occupational History  . Not on file  Tobacco Use  . Smoking status: Never Smoker  . Smokeless tobacco: Never Used  Substance and Sexual Activity  . Alcohol use: Not on file  . Drug use: Not on file  . Sexual activity: Not on file  Other Topics Concern  . Not on file  Social History Narrative  . Not on file     Family History  Problem Relation Age of Onset  . Breast cancer Maternal Aunt   . Breast cancer Cousin   . Breast cancer Cousin      Current Outpatient Medications:  .  atorvastatin (LIPITOR) 40 MG tablet, Take 40 mg by mouth daily., Disp: , Rfl:  .  esomeprazole (NEXIUM) 20 MG capsule, Take 20 mg by mouth daily at 12 noon., Disp: , Rfl:  .  Eszopiclone 3 MG TABS, Take 3 mg by mouth at bedtime. Take immediately before bedtime, Disp: , Rfl:  .  fluticasone (FLONASE) 50 MCG/ACT nasal spray, Place 2 sprays into both nostrils daily., Disp: , Rfl:  .  levothyroxine (SYNTHROID, LEVOTHROID) 137 MCG tablet, Take 137 mcg by mouth daily before breakfast., Disp: , Rfl:  .  lidocaine (LIDODERM) 5 %, Place 1 patch onto the skin daily. Remove & Discard patch within 12 hours or as directed by MD, Disp: 30 patch, Rfl: 0 .  montelukast (SINGULAIR) 10 MG tablet, Take 10 mg by mouth at bedtime., Disp: , Rfl:  .  oxybutynin (DITROPAN-XL) 10 MG 24 hr tablet, Take 10 mg by mouth at bedtime., Disp: , Rfl:  .  oxyCODONE-acetaminophen (ROXICET) 5-325 MG tablet, Take 1 tablet by mouth every 4 (four) hours as needed for severe pain., Disp: 20 tablet, Rfl: 0 .  Vortioxetine HBr (TRINTELLIX) 20 MG TABS, Take 20 mg by mouth daily., Disp: , Rfl:    Physical exam:  Vitals:   04/02/17 1435  BP: (!) 144/86  Pulse: 85  Resp: 18  Temp: (!) 97.2 F (36.2 C)  TempSrc: Tympanic  Weight: 173 lb 8 oz (78.7 kg)  Height: _0  (1.702 m)   Physical Exam  Constitutional: She is oriented to person, place, and time and well-developed,  well-nourished, and in no distress.  HENT:  Head: Normocephalic and atraumatic.  Eyes: EOM are normal. Pupils are equal, round, and reactive to light.  Neck: Normal range of motion.  Cardiovascular: Normal rate, regular rhythm and normal heart sounds.  Pulmonary/Chest: Effort normal and breath sounds normal.  Abdominal: Soft. Bowel sounds are normal.  Neurological: She is alert and oriented to person, place, and time.  Skin: Skin is warm and dry.  There is some induration and bruising seen at the site of left breast biopsy.  No palpable bilateral axillary adenopathy.  No palpable mass in the right breast.    No flowsheet data found. No flowsheet data found.  No images are attached to the encounter.  US Breast Ltd Uni Left Inc Axilla  Result Date: 03/14/2017 CLINICAL DATA:  66 year old female presenting for recall from screening in for a right breast asymmetry and a possible  left breast mass with distortion. EXAM: 2D DIGITAL DIAGNOSTIC BILATERAL MAMMOGRAM WITH CAD AND ADJUNCT TOMO LEFT BREAST ULTRASOUND COMPARISON:  Previous exam(s). ACR Breast Density Category c: The breast tissue is heterogeneously dense, which may obscure small masses. FINDINGS: The asymmetry seen in the right breast resolves on the tomosynthesis images and is consistent with overlapping fibroglandular tissue. In the upper inner far posterior depth of the left breast there is a tiny spiculated mass which does persist on today's images. Mammographic images were processed with CAD. Ultrasound targeted to the left breast at 10 o'clock, 6 cm from the nipple demonstrates a small area of shadowing measuring approximately 6 x 4 x 5 mm. No blood flow was identified within the mass. No evidence of left axillary lymphadenopathy. IMPRESSION: 1.  There is a suspicious mass in the left breast at 10 o'clock. 2.  No evidence of left axillary lymphadenopathy. 3. The asymmetry in the right breast resolves, consistent with overlapping  fibroglandular tissue. RECOMMENDATION: Ultrasound-guided biopsy is recommended for the left breast mass at 10 o'clock. I have discussed the findings and recommendations with the patient. Results were also provided in writing at the conclusion of the visit. If applicable, a reminder letter will be sent to the patient regarding the next appointment. BI-RADS CATEGORY  5: Highly suggestive of malignancy. Electronically Signed   By: Ammie Ferrier M.D.   On: 03/14/2017 15:41   Mm Diag Breast Tomo Bilateral  Result Date: 03/14/2017 CLINICAL DATA:  66 year old female presenting for recall from screening in for a right breast asymmetry and a possible left breast mass with distortion. EXAM: 2D DIGITAL DIAGNOSTIC BILATERAL MAMMOGRAM WITH CAD AND ADJUNCT TOMO LEFT BREAST ULTRASOUND COMPARISON:  Previous exam(s). ACR Breast Density Category c: The breast tissue is heterogeneously dense, which may obscure small masses. FINDINGS: The asymmetry seen in the right breast resolves on the tomosynthesis images and is consistent with overlapping fibroglandular tissue. In the upper inner far posterior depth of the left breast there is a tiny spiculated mass which does persist on today's images. Mammographic images were processed with CAD. Ultrasound targeted to the left breast at 10 o'clock, 6 cm from the nipple demonstrates a small area of shadowing measuring approximately 6 x 4 x 5 mm. No blood flow was identified within the mass. No evidence of left axillary lymphadenopathy. IMPRESSION: 1.  There is a suspicious mass in the left breast at 10 o'clock. 2.  No evidence of left axillary lymphadenopathy. 3. The asymmetry in the right breast resolves, consistent with overlapping fibroglandular tissue. RECOMMENDATION: Ultrasound-guided biopsy is recommended for the left breast mass at 10 o'clock. I have discussed the findings and recommendations with the patient. Results were also provided in writing at the conclusion of the visit.  If applicable, a reminder letter will be sent to the patient regarding the next appointment. BI-RADS CATEGORY  5: Highly suggestive of malignancy. Electronically Signed   By: Ammie Ferrier M.D.   On: 03/14/2017 15:41   Mm Clip Placement Left  Result Date: 03/27/2017 CLINICAL DATA:  Ultrasound-guided biopsy was performed of a left breast mass in the 10 o'clock position, posterior third, overlying the pectoralis muscle. EXAM: DIAGNOSTIC LEFT MAMMOGRAM POST ULTRASOUND BIOPSY COMPARISON:  Previous exam(s). FINDINGS: Mammographic images were obtained following ultrasound guided biopsy of a 6 mm spiculated left breast mass. A coil shaped biopsy clip is satisfactorily positioned, and seen to best advantage in the CC projection due to the posterior depth of the mass. IMPRESSION: Satisfactory position of coil shaped  biopsy clip in the left breast. Final Assessment: Post Procedure Mammograms for Marker Placement Electronically Signed   By: Curlene Dolphin M.D.   On: 03/27/2017 10:04   [place] Lt Breast Bx W Loc Dev 1st Lesion Img Bx Spec US Guide  Addendum Date: 03/29/2017   ADDENDUM REPORT: 03/29/2017 10:12 ADDENDUM: The pathology revealed invasive mammary carcinoma. This is found to be concordant with imaging findings. I discussed the results with patient over the phone and answered her questions. The patient states she is doing well post biopsy with pain at the biopsy site. The patient prefers Dr. Tamala Julian for surgical consultation. Recommend surgical consultation. Nurse navigator Sullivan will make the appointment for the patient and contact the patient. Electronically Signed   By: Abelardo Diesel M.D.   On: 03/29/2017 10:12   Result Date: 03/29/2017 CLINICAL DATA:  Ultrasound-guided biopsy was recommended of a small spiculated 0.6 cm mass overlying the pectoralis muscle in the 10 o'clock position left breast 6 cm from the nipple. EXAM: ULTRASOUND GUIDED LEFT BREAST CORE NEEDLE BIOPSY COMPARISON:  Previous exam(s).  FINDINGS: I met with the patient and we discussed the procedure of ultrasound-guided biopsy, including benefits and alternatives. We discussed the high likelihood of a successful procedure. We discussed the risks of the procedure, including infection, bleeding, tissue injury, clip migration, and inadequate sampling. Informed written consent was given. The usual time-out protocol was performed immediately prior to the procedure. Lesion quadrant: Upper inner quadrant Using sterile technique and 1% Lidocaine as local anesthetic, under direct ultrasound visualization, a 14 gauge spring-loaded device was used to perform biopsy of a 0.6 cm hypoechoic spiculated mass using a lateral to medial approach. At the conclusion of the procedure a coil shaped tissue marker clip was deployed into the biopsy cavity. Follow up 2 view mammogram was performed and dictated separately. IMPRESSION: Ultrasound guided biopsy of the left breast. No apparent complications. Electronically Signed: By: Curlene Dolphin M.D. On: 03/27/2017 10:06    Assessment and plan- Patient is a 66 y.o. female with newly diagnosed invasive mammary of the left breast stage I a cT1b cN0 cM0 ER greater than 90% positive, PR 1% positive and HER-2/neu equivocal on IHC FISH testing pending  Given that patient has a relatively small size of primary tumor with no suspicious left axillary adenopathy I would recommend that patient should proceed with upfront surgery regardless of HER-2 FISH testing results.  I will see the patient back after surgery and discuss adjuvant treatment options.    If the size of her tumor on final pathology is greater than 5 mm in size (and negative lymph nodes on final path) Oncotype testing would be indicated.  I discussed what Oncotype testing is and how the results are interpreted.  If the patient's Oncotype score is greater than or equal to 26 adjuvant chemotherapy would be indicated.  If her score is less than or equal to 25 adjuvant  chemotherapy would not be indicated.  Given that the grade of her tumor was grade 1 on initial biopsy, I would expect her HER-2 FISH testing to be negative.  Chemotherapy will likely be indicated if she has HER-2 positive breast cancer but again I would recommend that in the adjuvant not neoadjuvant setting. Treatment will be given with a curative intent.  Given that her tumor is ER positive adjuvant hormone therapy would be indicated.  I would discuss this in greater detail when I see her after surgery.  Patient will also need radiation post lumpectomy and  I will make the referral during next visit. I will see her tentively in 2 weeks time  Given her family history of breast cancer and prostate cancer we will touch base with genetic counseling to see if she would qualify for genetic testing   Thank you for this kind referral and the opportunity to participate in the care of this patient   Visit Diagnosis 1. Malignant neoplasm of upper-outer quadrant of left breast in female, estrogen receptor positive ([place])   2. Goals of care, counseling/discussion     Dr. Randa Evens, MD, MPH [facility] Pager- [phone] 04/02/2017

## 2017-04-02 NOTE — Progress Notes (Signed)
Pt new for breast cancer. Pt with no pain but still bruised

## 2017-04-03 ENCOUNTER — Encounter: Payer: Self-pay | Admitting: Oncology

## 2017-04-04 LAB — SURGICAL PATHOLOGY

## 2017-04-05 ENCOUNTER — Other Ambulatory Visit: Payer: Self-pay | Admitting: *Deleted

## 2017-04-05 ENCOUNTER — Encounter: Payer: Self-pay | Admitting: Diagnostic Radiology

## 2017-04-05 ENCOUNTER — Telehealth: Payer: Self-pay | Admitting: Genetic Counselor

## 2017-04-05 NOTE — Telephone Encounter (Signed)
April Leblanc was referred for genetic counseling by Dr. Janese Banks due to a personal and family history of cancer. I spoke with April Leblanc and she chose to schedule this telegenetics visit to be done by phone on Friday 04/06/17 at 2:30pm.   Steele Berg, [place], [facility] Genetic Counselor Phone: [phone]

## 2017-04-06 ENCOUNTER — Other Ambulatory Visit: Payer: Self-pay | Admitting: Surgery

## 2017-04-06 ENCOUNTER — Encounter: Payer: Self-pay | Admitting: Genetic Counselor

## 2017-04-06 ENCOUNTER — Telehealth: Payer: Self-pay | Admitting: Genetic Counselor

## 2017-04-06 ENCOUNTER — Other Ambulatory Visit: Payer: Self-pay

## 2017-04-06 ENCOUNTER — Telehealth: Payer: Self-pay | Admitting: *Deleted

## 2017-04-06 ENCOUNTER — Encounter
Admission: RE | Admit: 2017-04-06 | Discharge: 2017-04-06 | Disposition: A | Discharge status: Home / Self Care | Payer: Medicare Other | Source: Ambulatory Visit | Attending: Surgery | Admitting: Surgery

## 2017-04-06 DIAGNOSIS — C50212 Malignant neoplasm of upper-inner quadrant of left female breast: Secondary | ICD-10-CM

## 2017-04-06 DIAGNOSIS — Z01812 Encounter for preprocedural laboratory examination: Secondary | ICD-10-CM | POA: Diagnosis present

## 2017-04-06 DIAGNOSIS — Z170 Estrogen receptor positive status [ER+]: Principal | ICD-10-CM

## 2017-04-06 DIAGNOSIS — Z01810 Encounter for preprocedural cardiovascular examination: Secondary | ICD-10-CM | POA: Diagnosis present

## 2017-04-06 DIAGNOSIS — I10 Essential (primary) hypertension: Secondary | ICD-10-CM | POA: Diagnosis not present

## 2017-04-06 HISTORY — DX: Unspecified fracture of the lower end of unspecified radius, initial encounter for closed fracture: S52.509A

## 2017-04-06 HISTORY — DX: Unspecified osteoarthritis, unspecified site: M19.90

## 2017-04-06 HISTORY — DX: Sprain of unspecified ligament of unspecified ankle, initial encounter: S93.409A

## 2017-04-06 HISTORY — DX: Prediabetes: R73.03

## 2017-04-06 NOTE — Telephone Encounter (Signed)
-----   Message from Steele Berg, Counselor sent at 04/06/2017  9:59 AM EST ----- Regarding: Need blood draw coordination help Hi Judeen Hammans and Vita Barley,  Ms. Frane just had the genetic counseling session and needs to come to the [place] lab for a blood draw for genetic testing. Can you help coordinate it? It needs to be in [place] as [place] and Merrily Pew know how to coordinate the shipment.  She also mentioned she needs to reschedule her 12/17 appt with Dr. Janese Banks because her surgery date has been set to 12/14 and the appt with Dr. Janese Banks needs to be 2 weeks after that. She said it's just as easy for her to come to [place] than it is to [place].  Let me know if you need any other info and please let me know the date of the lab appt so I can keep track of the testing.  Thank you so much.  ----- Message ----- From: Luella Cook, RN Sent: 04/04/2017   4:53 PM To: Steele Berg, Counselor  Another possible genetic referral ..  Dr. Janese Banks not sure for testing on this patient if she would qualify She has stage I breast cancer  Family hx- brother -prostate cancer Maternal aunt with breast cancer 2 cousins on mat. Side with breast cancer Son- kidney cancer Brother-lung cancer  Let us know Thanks Judeen Hammans and Dr. Janese Banks

## 2017-04-06 NOTE — Telephone Encounter (Signed)
           Cancer Genetics            Telegenetics Initial Visit    Patient Name: April Leblanc Patient DOB: 06/23/1950 Patient Age: 66 y.o. Phone Call Date: 04/06/2017  Referring Provider: Archana Rao, MD  Reason for Visit: Evaluate for hereditary susceptibility to cancer    Assessment and Plan:  . April Leblanc's history is not suggestive of a hereditary predisposition to cancer. However, she meets NCCN criteria for genetic testing due to her personal history of breast cancer in combination with her family history of breast cancer in a maternal aunt and 2 maternal first cousins.  . Testing is recommended to determine whether she has a pathogenic mutation that will impact her screening and risk-reduction for cancer. A negative result will be reassuring.  . April Leblanc wished to pursue genetic testing and will schedule a lab visit for a blood sample. Analysis will include the 83 genes on [facility]'s Multi-Cancer panel (ALK, APC, ATM, AXIN2, BAP1, BARD1, BLM, BMPR1A, BRCA1, BRCA2, BRIP1, CASR, CDC73, CDH1, CDK4, CDKN1B, CDKN1C, CDKN2A, CEBPA, CHEK2, CTNNA1, DICER1, DIS3L2, EGFR, EPCAM, FH, FLCN, GATA2, GPC3, GREM1, HOXB13, HRAS, KIT, MAX, MEN1, MET, MITF, MLH1, MSH2, MSH3, MSH6, MUTYH, NBN, NF1, NF2, NTHL1, PALB2, PDGFRA, PHOX2B, PMS2, POLD1, POLE, POT1, PRKAR1A, PTCH1, PTEN, RAD50, RAD51C, RAD51D, RB1, RECQL4, RET, RUNX1, SDHA, SDHAF2, SDHB, SDHC, SDHD, SMAD4, SMARCA4, SMARCB1, SMARCE1, STK11, SUFU, TERC, TERT, TMEM127, TP53, TSC1, TSC2, VHL, WRN, WT1).   . Results should be available in approximately 2-3 weeks, at which point we will contact her and address implications for her as well as address genetic testing for at-risk family members, if needed.     Dr. Finnegan was available for questions concerning this case. Total time spent by counseling by phone was approximately 35 minutes.   _____________________________________________________________________   History of Present  Illness: April Leblanc, a 66 y.o. female, was referred for genetic counseling to discuss the possibility of a hereditary predisposition to cancer and discuss whether genetic testing is warranted. This was a telegenetics visit via phone.  April Leblanc was diagnosed with breast cancer at the age of 66. She is planning to proceed with a lumpectomy.   She had a hysterectomy in her 40s, but her ovaries remain in tact.   Past Medical History:  Diagnosis Date  . Anemia   . Breast cancer (HCC) 2018  . Depression   . GERD (gastroesophageal reflux disease)   . Headache   . Hyperlipidemia   . Hypothyroidism   . Sleep apnea   . Vitamin B 12 deficiency     Past Surgical History:  Procedure Laterality Date  . ABDOMINAL HYSTERECTOMY    . APPENDECTOMY     when she had hystercetomy  . CESAREAN SECTION    . COLONOSCOPY    . ESOPHAGOGASTRODUODENOSCOPY (EGD) WITH PROPOFOL N/A 02/05/2015   Procedure: ESOPHAGOGASTRODUODENOSCOPY (EGD) WITH PROPOFOL;  Surgeon: Matthew Gordon Rein, MD;  Location: [facility] ENDOSCOPY;  Service: Endoscopy;  Laterality: N/A;  . SAVORY DILATION N/A 02/05/2015   Procedure: SAVORY DILATION;  Surgeon: Matthew Gordon Rein, MD;  Location: [facility] ENDOSCOPY;  Service: Endoscopy;  Laterality: N/A;  . SINUS SURGERY WITH INSTATRAK      Family History: Significant diagnoses include the following:  Family History  Problem Relation Age of Onset  . Breast cancer Maternal Aunt 70       deceased 80s  . Breast cancer Cousin 55         daughter of mat aunt with breast cancer  . Breast cancer Cousin 34       daughter of mat aunt with breast cancer  . Deep vein thrombosis Mother   . Heart disease Mother   . Stroke Mother   . Heart disease Father   . Parkinson's disease Father   . Heart disease Sister   . Stroke Sister   . Skin cancer Brother   . Heart disease Brother   . Alzheimer's disease Brother   . Lung cancer Paternal Uncle        deceased 24s  . Heart disease Brother   .  Alzheimer's disease Brother   . Heart disease Brother   . Alzheimer's disease Brother   . Cancer Brother        unclear primary; currently 63s  . Cirrhosis Sister   . Alzheimer's disease Sister   . Kidney cancer Son 34       nephrectomy @ [facility]    Additionally, April Leblanc has a son (age 10) and a daughter (age 59s). She has a total of 4 brothers and 2 sisters. Her mother died in her 53s. She had 4 sisters and a brother. Her father died at 57. He had 2 sisters and 3 brothers. She has no information about her grandparents.  April Leblanc ancestry is Caucasian - NOS. There is no known Jewish ancestry and no consanguinity.  Discussion: We reviewed the characteristics, features and inheritance patterns of hereditary cancer syndromes. We discussed her risk of harboring a mutation in the context of her personal and family history. We discussed the process of genetic testing, insurance coverage and implications of results: positive, negative and variant of unknown significance (VUS).   April Leblanc questions were answered to her satisfaction today and she is welcome to call with any additional questions or concerns. Thank you for the referral and allowing [place] to share in the care of your patient.    Steele Berg, MS, [place] Certified Genetic Counselor phone: [phone]

## 2017-04-06 NOTE — Telephone Encounter (Signed)
I called the patient and she states that Dr. Janese Banks wanted to see her back after 2 weeks from surgery.  Her current appt is for 12/17 and she is having surgery 12/14.  I have put in request to change her appt to 12/28 at 2:15. Patient is agreeable. Also I was calling to see when she would like to come and get genetic test drawn.  She would like to wait til 12/28 appt to see Janese Banks. I have entered a note in comments that we will see pt and after we see her we will send her down to lab for the genetic test.

## 2017-04-06 NOTE — Patient Instructions (Addendum)
Your procedure is scheduled on: Friday, December 14th, 2018  Report to [facility] AM   Remember: Instructions that are not followed completely may result in serious medical risk, up to and including death, or upon the discretion of your surgeon and anesthesiologist your surgery may need to be rescheduled.     _X__ 1. Do not eat food after midnight the night before your procedure.                 No gum chewing or hard candies. You may drink clear liquids up to 2 hours                 before you are scheduled to arrive for your surgery- DO not drink clear                 liquids within 2 hours of the start of your surgery.                 Clear Liquids include:  water, apple juice without pulp, clear carbohydrate                 drink such as Clearfast of Gartorade, Black Coffee or Tea (Do not add                 anything to coffee or tea).     _X__ 2.  No Alcohol for 24 hours before or after surgery.   _X__ 3.  Do Not Smoke or use e-cigarettes For 24 Hours Prior to Your Surgery.                 Do not use any chewable tobacco products for at least 6 hours prior to                 surgery.  ____  4.  Bring all medications with you on the day of surgery if instructed.   __X__  5.  Notify your doctor if there is any change in your medical condition      (cold, fever, infections).     Do not wear jewelry, make-up, hairpins, clips or nail polish. Do not wear lotions, powders, or perfumes. You may wear deodorant. Do not shave 48 hours prior to surgery. Men may shave face and neck. Do not bring valuables to the hospital.    [facility] is not responsible for any belongings or valuables.  Contacts, dentures or bridgework may not be worn into surgery. Leave your suitcase in the car. After surgery it may be brought to your room. For patients admitted to the hospital, discharge time is determined by your treatment team.   Patients discharged the  day of surgery will not be allowed to drive home.   Please read over the following fact sheets that you were given:   [place]   ____ Take these medicines the morning of surgery with A SIP OF WATER:    1. [place]  2. FLONASE, IF YOU NEED IT  3. ATIVAN  4. SYNTHROID  5. SINGULAIR, IF YOU NEED IT  6. TRINTELLIX             7.  ENABLEX  ____ Fleet Enema (as directed)   __X__ Use CHG Soap as directed  ____ Use inhalers on the day of surgery  ____ Stop metformin 2 days prior to surgery  ____ Take 1/2 of usual insulin dose the night before surgery. No insulin the morning          of surgery.   ____ Stop Coumadin/Plavix/ ASPIRIN AS OF TODAY 04/06/2017  ____ Stop Anti-inflammatories AS OF TODAY 04/06/2017 .  THIS INCLUDES IBUPROFEN / MOTRIN / ADVIL / ALEVE   ____ Stop supplements until after surgery.    ____ Bring C-Pap to the hospital.    TAKE TYLENOL FOR ANY PAIN OR HEADACHES PRIOR TO SURGERY.  BRING A SPORTS BRA THAT OPENS IN THE FRONT.  CONTINUE TO TAKE ALL OTHER MEDICATIONS AS USUAL.  DO NOT TAKE ADDERALL ON THE MORNING OF SURGERY

## 2017-04-12 ENCOUNTER — Encounter
Admission: RE | Admit: 2017-04-12 | Discharge: 2017-04-12 | Disposition: A | Discharge status: Home / Self Care | Payer: Medicare Other | Source: Ambulatory Visit | Attending: Surgery | Admitting: Surgery

## 2017-04-12 DIAGNOSIS — Z882 Allergy status to sulfonamides status: Secondary | ICD-10-CM | POA: Diagnosis not present

## 2017-04-12 DIAGNOSIS — Z90710 Acquired absence of both cervix and uterus: Secondary | ICD-10-CM | POA: Diagnosis not present

## 2017-04-12 DIAGNOSIS — Z8042 Family history of malignant neoplasm of prostate: Secondary | ICD-10-CM | POA: Diagnosis not present

## 2017-04-12 DIAGNOSIS — G4730 Sleep apnea, unspecified: Secondary | ICD-10-CM | POA: Diagnosis not present

## 2017-04-12 DIAGNOSIS — K219 Gastro-esophageal reflux disease without esophagitis: Secondary | ICD-10-CM | POA: Diagnosis not present

## 2017-04-12 DIAGNOSIS — C50912 Malignant neoplasm of unspecified site of left female breast: Secondary | ICD-10-CM | POA: Diagnosis present

## 2017-04-12 DIAGNOSIS — E785 Hyperlipidemia, unspecified: Secondary | ICD-10-CM | POA: Diagnosis not present

## 2017-04-12 DIAGNOSIS — E039 Hypothyroidism, unspecified: Secondary | ICD-10-CM | POA: Diagnosis not present

## 2017-04-12 DIAGNOSIS — Z823 Family history of stroke: Secondary | ICD-10-CM | POA: Diagnosis not present

## 2017-04-12 DIAGNOSIS — Z91041 Radiographic dye allergy status: Secondary | ICD-10-CM | POA: Diagnosis not present

## 2017-04-12 DIAGNOSIS — Z885 Allergy status to narcotic agent status: Secondary | ICD-10-CM | POA: Diagnosis not present

## 2017-04-12 DIAGNOSIS — Z8249 Family history of ischemic heart disease and other diseases of the circulatory system: Secondary | ICD-10-CM | POA: Diagnosis not present

## 2017-04-12 DIAGNOSIS — Z881 Allergy status to other antibiotic agents status: Secondary | ICD-10-CM | POA: Diagnosis not present

## 2017-04-12 DIAGNOSIS — Z803 Family history of malignant neoplasm of breast: Secondary | ICD-10-CM | POA: Diagnosis not present

## 2017-04-12 DIAGNOSIS — G43909 Migraine, unspecified, not intractable, without status migrainosus: Secondary | ICD-10-CM | POA: Diagnosis not present

## 2017-04-12 DIAGNOSIS — F329 Major depressive disorder, single episode, unspecified: Secondary | ICD-10-CM | POA: Diagnosis not present

## 2017-04-12 DIAGNOSIS — Z79899 Other long term (current) drug therapy: Secondary | ICD-10-CM | POA: Diagnosis not present

## 2017-04-12 DIAGNOSIS — K449 Diaphragmatic hernia without obstruction or gangrene: Secondary | ICD-10-CM | POA: Diagnosis not present

## 2017-04-12 DIAGNOSIS — Z170 Estrogen receptor positive status [ER+]: Secondary | ICD-10-CM | POA: Diagnosis not present

## 2017-04-12 DIAGNOSIS — C50212 Malignant neoplasm of upper-inner quadrant of left female breast: Secondary | ICD-10-CM | POA: Diagnosis not present

## 2017-04-12 DIAGNOSIS — Z7951 Long term (current) use of inhaled steroids: Secondary | ICD-10-CM | POA: Diagnosis not present

## 2017-04-12 DIAGNOSIS — R7303 Prediabetes: Secondary | ICD-10-CM | POA: Diagnosis not present

## 2017-04-12 DIAGNOSIS — Z79890 Hormone replacement therapy (postmenopausal): Secondary | ICD-10-CM | POA: Diagnosis not present

## 2017-04-12 DIAGNOSIS — Z9889 Other specified postprocedural states: Secondary | ICD-10-CM | POA: Diagnosis not present

## 2017-04-12 DIAGNOSIS — E538 Deficiency of other specified B group vitamins: Secondary | ICD-10-CM | POA: Diagnosis not present

## 2017-04-12 LAB — CBC
HEMATOCRIT: 41.2 % (ref 35.0–47.0)
Hemoglobin: 13.8 g/dL (ref 12.0–16.0)
MCH: 28.8 pg (ref 26.0–34.0)
MCHC: 33.5 g/dL (ref 32.0–36.0)
MCV: 86 fL (ref 80.0–100.0)
PLATELETS: 239 10*3/uL (ref 150–440)
RBC: 4.79 MIL/uL (ref 3.80–5.20)
RDW: 13.8 % (ref 11.5–14.5)
WBC: 9.4 10*3/uL (ref 3.6–11.0)

## 2017-04-12 LAB — BASIC METABOLIC PANEL
Anion gap: 10 (ref 5–15)
BUN: 19 mg/dL (ref 6–20)
CHLORIDE: 102 mmol/L (ref 101–111)
CO2: 25 mmol/L (ref 22–32)
CREATININE: 0.65 mg/dL (ref 0.44–1.00)
Calcium: 9.3 mg/dL (ref 8.9–10.3)
GFR calc Af Amer: >60 mL/min (ref >60)
GLUCOSE: 98 mg/dL (ref 65–99)
Potassium: 4 mmol/L (ref 3.5–5.1)
SODIUM: 137 mmol/L (ref 135–145)

## 2017-04-13 ENCOUNTER — Encounter
Admission: RE | Admit: 2017-04-13 | Discharge: 2017-04-13 | Disposition: A | Discharge status: Home / Self Care | Payer: Medicare Other | Source: Ambulatory Visit | Attending: Surgery | Admitting: Surgery

## 2017-04-13 ENCOUNTER — Ambulatory Visit: Payer: Medicare Other | Admitting: Anesthesiology

## 2017-04-13 ENCOUNTER — Encounter
Admission: RE | Disposition: A | Discharge status: Home / Self Care | Payer: Self-pay | Source: Ambulatory Visit | Attending: Surgery

## 2017-04-13 ENCOUNTER — Ambulatory Visit
Admission: RE | Admit: 2017-04-13 | Discharge: 2017-04-13 | Disposition: A | Discharge status: Home / Self Care | Payer: Medicare Other | Source: Ambulatory Visit | Attending: Surgery | Admitting: Surgery

## 2017-04-13 ENCOUNTER — Encounter: Payer: Self-pay | Admitting: *Deleted

## 2017-04-13 ENCOUNTER — Other Ambulatory Visit: Payer: Self-pay

## 2017-04-13 DIAGNOSIS — E538 Deficiency of other specified B group vitamins: Secondary | ICD-10-CM | POA: Insufficient documentation

## 2017-04-13 DIAGNOSIS — Z9889 Other specified postprocedural states: Secondary | ICD-10-CM | POA: Insufficient documentation

## 2017-04-13 DIAGNOSIS — R7303 Prediabetes: Secondary | ICD-10-CM | POA: Insufficient documentation

## 2017-04-13 DIAGNOSIS — Z803 Family history of malignant neoplasm of breast: Secondary | ICD-10-CM | POA: Insufficient documentation

## 2017-04-13 DIAGNOSIS — E785 Hyperlipidemia, unspecified: Secondary | ICD-10-CM | POA: Insufficient documentation

## 2017-04-13 DIAGNOSIS — K219 Gastro-esophageal reflux disease without esophagitis: Secondary | ICD-10-CM | POA: Insufficient documentation

## 2017-04-13 DIAGNOSIS — G43909 Migraine, unspecified, not intractable, without status migrainosus: Secondary | ICD-10-CM | POA: Insufficient documentation

## 2017-04-13 DIAGNOSIS — Z8042 Family history of malignant neoplasm of prostate: Secondary | ICD-10-CM | POA: Insufficient documentation

## 2017-04-13 DIAGNOSIS — F329 Major depressive disorder, single episode, unspecified: Secondary | ICD-10-CM | POA: Insufficient documentation

## 2017-04-13 DIAGNOSIS — Z8249 Family history of ischemic heart disease and other diseases of the circulatory system: Secondary | ICD-10-CM | POA: Insufficient documentation

## 2017-04-13 DIAGNOSIS — E039 Hypothyroidism, unspecified: Secondary | ICD-10-CM | POA: Insufficient documentation

## 2017-04-13 DIAGNOSIS — G4730 Sleep apnea, unspecified: Secondary | ICD-10-CM | POA: Insufficient documentation

## 2017-04-13 DIAGNOSIS — Z882 Allergy status to sulfonamides status: Secondary | ICD-10-CM | POA: Insufficient documentation

## 2017-04-13 DIAGNOSIS — C50212 Malignant neoplasm of upper-inner quadrant of left female breast: Secondary | ICD-10-CM

## 2017-04-13 DIAGNOSIS — Z841 Family history of disorders of kidney and ureter: Secondary | ICD-10-CM | POA: Insufficient documentation

## 2017-04-13 DIAGNOSIS — Z885 Allergy status to narcotic agent status: Secondary | ICD-10-CM | POA: Insufficient documentation

## 2017-04-13 DIAGNOSIS — K449 Diaphragmatic hernia without obstruction or gangrene: Secondary | ICD-10-CM | POA: Insufficient documentation

## 2017-04-13 DIAGNOSIS — Z79899 Other long term (current) drug therapy: Secondary | ICD-10-CM | POA: Insufficient documentation

## 2017-04-13 DIAGNOSIS — Z91041 Radiographic dye allergy status: Secondary | ICD-10-CM | POA: Insufficient documentation

## 2017-04-13 DIAGNOSIS — Z170 Estrogen receptor positive status [ER+]: Secondary | ICD-10-CM | POA: Insufficient documentation

## 2017-04-13 DIAGNOSIS — Z820 Family history of epilepsy and other diseases of the nervous system: Secondary | ICD-10-CM | POA: Insufficient documentation

## 2017-04-13 DIAGNOSIS — Z7951 Long term (current) use of inhaled steroids: Secondary | ICD-10-CM | POA: Insufficient documentation

## 2017-04-13 DIAGNOSIS — Z90710 Acquired absence of both cervix and uterus: Secondary | ICD-10-CM | POA: Insufficient documentation

## 2017-04-13 DIAGNOSIS — Z79890 Hormone replacement therapy (postmenopausal): Secondary | ICD-10-CM | POA: Insufficient documentation

## 2017-04-13 DIAGNOSIS — Z881 Allergy status to other antibiotic agents status: Secondary | ICD-10-CM | POA: Insufficient documentation

## 2017-04-13 DIAGNOSIS — Z823 Family history of stroke: Secondary | ICD-10-CM | POA: Insufficient documentation

## 2017-04-13 HISTORY — PX: BREAST LUMPECTOMY: SHX2

## 2017-04-13 HISTORY — PX: PARTIAL MASTECTOMY WITH NEEDLE LOCALIZATION: SHX6008

## 2017-04-13 HISTORY — PX: SENTINEL NODE BIOPSY: SHX6608

## 2017-04-13 LAB — GLUCOSE, CAPILLARY: Glucose-Capillary: 93 mg/dL (ref 65–99)

## 2017-04-13 SURGERY — PARTIAL MASTECTOMY WITH NEEDLE LOCALIZATION
Anesthesia: General | Laterality: Left | Wound class: Clean

## 2017-04-13 MED ORDER — LACTATED RINGERS IV SOLN
INTRAVENOUS | Status: DC | PRN
  Administered 2017-04-13: 13:00:00 via INTRAVENOUS

## 2017-04-13 MED ORDER — TECHNETIUM TC 99M SULFUR COLLOID FILTERED
0.7730 | Freq: Once | INTRAVENOUS | Status: AC | PRN
  Administered 2017-04-13: 0.773 via INTRADERMAL

## 2017-04-13 MED ORDER — PROPOFOL 10 MG/ML IV BOLUS
INTRAVENOUS | Status: DC | PRN
  Administered 2017-04-13: 150 mg via INTRAVENOUS

## 2017-04-13 MED ORDER — MIDAZOLAM HCL 2 MG/2ML IJ SOLN
INTRAMUSCULAR | Status: AC
  Filled 2017-04-13: qty 2

## 2017-04-13 MED ORDER — PHENYLEPHRINE HCL 10 MG/ML IJ SOLN
INTRAMUSCULAR | Status: DC | PRN
  Administered 2017-04-13: 100 ug via INTRAVENOUS

## 2017-04-13 MED ORDER — FENTANYL CITRATE (PF) 100 MCG/2ML IJ SOLN
INTRAMUSCULAR | Status: AC
  Administered 2017-04-13: 25 ug via INTRAVENOUS
  Filled 2017-04-13: qty 2

## 2017-04-13 MED ORDER — FENTANYL CITRATE (PF) 250 MCG/5ML IJ SOLN
INTRAMUSCULAR | Status: AC
  Filled 2017-04-13: qty 5

## 2017-04-13 MED ORDER — BUPIVACAINE-EPINEPHRINE 0.5% -1:200000 IJ SOLN
INTRAMUSCULAR | Status: DC | PRN
  Administered 2017-04-13: 25 mL

## 2017-04-13 MED ORDER — MIDAZOLAM HCL 2 MG/2ML IJ SOLN
INTRAMUSCULAR | Status: DC | PRN
  Administered 2017-04-13: 2 mg via INTRAVENOUS

## 2017-04-13 MED ORDER — HYDROMORPHONE HCL 1 MG/ML IJ SOLN
INTRAMUSCULAR | Status: AC
  Filled 2017-04-13: qty 1

## 2017-04-13 MED ORDER — FENTANYL CITRATE (PF) 100 MCG/2ML IJ SOLN
25.0000 ug | INTRAMUSCULAR | Status: DC | PRN
  Administered 2017-04-13 (×4): 25 ug via INTRAVENOUS

## 2017-04-13 MED ORDER — MEPERIDINE HCL 50 MG/ML IJ SOLN: 6.2500 mg | INTRAMUSCULAR | Status: DC | PRN

## 2017-04-13 MED ORDER — PROPOFOL 10 MG/ML IV BOLUS
INTRAVENOUS | Status: AC
  Filled 2017-04-13: qty 20

## 2017-04-13 MED ORDER — LIDOCAINE HCL (CARDIAC) 20 MG/ML IV SOLN
INTRAVENOUS | Status: DC | PRN
  Administered 2017-04-13: 80 mg via INTRAVENOUS

## 2017-04-13 MED ORDER — HYDROMORPHONE HCL 1 MG/ML IJ SOLN
INTRAMUSCULAR | Status: DC | PRN
  Administered 2017-04-13: .2 mg via INTRAVENOUS

## 2017-04-13 MED ORDER — ONDANSETRON HCL 4 MG/2ML IJ SOLN
INTRAMUSCULAR | Status: DC | PRN
  Administered 2017-04-13: 4 mg via INTRAVENOUS

## 2017-04-13 MED ORDER — DEXAMETHASONE SODIUM PHOSPHATE 10 MG/ML IJ SOLN
INTRAMUSCULAR | Status: DC | PRN
  Administered 2017-04-13: 5 mg via INTRAVENOUS

## 2017-04-13 MED ORDER — OXYCODONE HCL 5 MG PO TABS: 5.0000 mg | ORAL_TABLET | Freq: Once | ORAL | Status: DC | PRN

## 2017-04-13 MED ORDER — PROMETHAZINE HCL 25 MG/ML IJ SOLN: 6.2500 mg | INTRAMUSCULAR | Status: DC | PRN

## 2017-04-13 MED ORDER — FENTANYL CITRATE (PF) 100 MCG/2ML IJ SOLN
INTRAMUSCULAR | Status: DC | PRN
  Administered 2017-04-13: 50 ug via INTRAVENOUS

## 2017-04-13 MED ORDER — BUPIVACAINE-EPINEPHRINE (PF) 0.5% -1:200000 IJ SOLN
INTRAMUSCULAR | Status: AC
  Filled 2017-04-13: qty 30

## 2017-04-13 MED ORDER — ONDANSETRON HCL 4 MG/2ML IJ SOLN
INTRAMUSCULAR | Status: AC
  Filled 2017-04-13: qty 2

## 2017-04-13 MED ORDER — OXYCODONE HCL 5 MG/5ML PO SOLN: 5.0000 mg | Freq: Once | ORAL | Status: DC | PRN

## 2017-04-13 MED ORDER — HYDROCODONE-ACETAMINOPHEN 5-325 MG PO TABS: 1.0000 | ORAL_TABLET | Freq: Four times a day (QID) | ORAL | 0 refills | Status: DC | PRN

## 2017-04-13 MED ORDER — SODIUM CHLORIDE 0.9 % IV SOLN
INTRAVENOUS | Status: DC
  Administered 2017-04-13: 10:00:00 via INTRAVENOUS

## 2017-04-13 SURGICAL SUPPLY — 34 items
BLADE SURG 15 STRL LF DISP TIS (BLADE) ×1 IMPLANT
BLADE SURG 15 STRL SS (BLADE) ×2
CANISTER SUCT 1200ML W/VALVE (MISCELLANEOUS) ×3 IMPLANT
CHLORAPREP W/TINT 26ML (MISCELLANEOUS) ×3 IMPLANT
CNTNR SPEC 2.5X3XGRAD LEK (MISCELLANEOUS) ×2
CONT SPEC 4OZ STER OR WHT (MISCELLANEOUS) ×4
CONTAINER SPEC 2.5X3XGRAD LEK (MISCELLANEOUS) ×2 IMPLANT
DERMABOND ADVANCED (GAUZE/BANDAGES/DRESSINGS) ×2
DERMABOND ADVANCED .7 DNX12 (GAUZE/BANDAGES/DRESSINGS) ×1 IMPLANT
DEVICE DUBIN SPECIMEN MAMMOGRA (MISCELLANEOUS) ×3 IMPLANT
DRAPE LAPAROTOMY 77X122 PED (DRAPES) IMPLANT
ELECT REM PT RETURN 9FT ADLT (ELECTROSURGICAL) ×3
ELECTRODE REM PT RTRN 9FT ADLT (ELECTROSURGICAL) ×1 IMPLANT
GLOVE BIO SURGEON STRL SZ7.5 (GLOVE) ×3 IMPLANT
GOWN STRL REUS W/ TWL LRG LVL3 (GOWN DISPOSABLE) ×2 IMPLANT
GOWN STRL REUS W/TWL LRG LVL3 (GOWN DISPOSABLE) ×4
KIT RM TURNOVER STRD PROC AR (KITS) ×3 IMPLANT
LABEL OR SOLS (LABEL) IMPLANT
MARGIN MAP 10MM (MISCELLANEOUS) ×3 IMPLANT
NDL SAFETY ECLIPSE 18X1.5 (NEEDLE) IMPLANT
NEEDLE HYPO 18GX1.5 SHARP (NEEDLE)
NEEDLE HYPO 22GX1.5 SAFETY (NEEDLE) ×3 IMPLANT
NEEDLE HYPO 25X1 1.5 SAFETY (NEEDLE) IMPLANT
PACK BASIN MINOR ARMC (MISCELLANEOUS) ×3 IMPLANT
SLEVE PROBE SENORX GAMMA FIND (MISCELLANEOUS) ×3 IMPLANT
SUT CHROMIC 3 0 SH 27 (SUTURE) ×3 IMPLANT
SUT CHROMIC 4 0 RB 1X27 (SUTURE) ×3 IMPLANT
SUT ETHILON 3-0 FS-10 30 BLK (SUTURE) ×3
SUT MNCRL 4-0 (SUTURE) ×2
SUT MNCRL 4-0 27XMFL (SUTURE) ×1
SUTURE EHLN 3-0 FS-10 30 BLK (SUTURE) ×1 IMPLANT
SUTURE MNCRL 4-0 27XMF (SUTURE) ×1 IMPLANT
SYR 10ML LL (SYRINGE) ×3 IMPLANT
WATER STERILE IRR 1000ML POUR (IV SOLUTION) ×3 IMPLANT

## 2017-04-13 NOTE — Anesthesia Preprocedure Evaluation (Signed)
Anesthesia Evaluation  Patient identified by MRN, date of birth, ID band Patient awake    Reviewed: Allergy & Precautions, NPO status , Patient's Chart, lab work & pertinent test results  History of Anesthesia Complications Negative for: history of anesthetic complications  Airway Mallampati: II  TM Distance: >3 FB Neck ROM: Full    Dental no notable dental hx.    Pulmonary neg COPD,    breath sounds clear to auscultation- rhonchi (-) wheezing      Cardiovascular Exercise Tolerance: Good (-) hypertension(-) CAD, (-) Past MI and (-) Cardiac Stents  Rhythm:Regular Rate:Normal - Systolic murmurs and - Diastolic murmurs    Neuro/Psych  Headaches, PSYCHIATRIC DISORDERS Depression    GI/Hepatic Neg liver ROS, GERD  ,  Endo/Other  neg diabetesHypothyroidism   Renal/GU negative Renal ROS     Musculoskeletal  (+) Arthritis ,   Abdominal (+) - obese,   Peds  Hematology  (+) anemia ,   Anesthesia Other Findings Past Medical History: No date: Anemia 03/2017: Ankle sprain No date: Arthritis 2018: Breast cancer ([place]) 03/2017: Breast cancer ([place]) No date: Depression 03/2017: Fracture of distal end of radius     Comment:  left No date: GERD (gastroesophageal reflux disease) No date: Headache No date: Hyperlipidemia No date: Hypothyroidism 03/2017: Pre-diabetes     Comment:  follows healthy diabetic diet No date: Sleep apnea     Comment:  has not used cpap in a long time No date: Vitamin B 12 deficiency   Reproductive/Obstetrics                             Anesthesia Physical Anesthesia Plan  ASA: II  Anesthesia Plan: General   Post-op Pain Management:    Induction: Intravenous  PONV Risk Score and Plan: 2 and Dexamethasone and Ondansetron  Airway Management Planned: LMA  Additional Equipment:   Intra-op Plan:   Post-operative Plan:   Informed Consent: I have reviewed the  patients History and Physical, chart, labs and discussed the procedure including the risks, benefits and alternatives for the proposed anesthesia with the patient or authorized representative who has indicated his/her understanding and acceptance.   Dental advisory given  Plan Discussed with: CRNA and Anesthesiologist  Anesthesia Plan Comments:         Anesthesia Quick Evaluation

## 2017-04-13 NOTE — Discharge Instructions (Addendum)
° ° ° ° ° °  AMBULATORY SURGERY  DISCHARGE INSTRUCTIONS   1) The drugs that you were given will stay in your system until tomorrow so for the next 24 hours you should not:  A) Drive an automobile B) Make any legal decisions C) Drink any alcoholic beverage   2) You may resume regular meals tomorrow.  Today it is better to start with liquids and gradually work up to solid foods.  You may eat anything you prefer, but it is better to start with liquids, then soup and crackers, and gradually work up to solid foods.   3) Please notify your doctor immediately if you have any unusual bleeding, trouble breathing, redness and pain at the surgery site, drainage, fever, or pain not relieved by medication.    4) Additional Instructions:        Please contact your physician with any problems or Same Day Surgery at [phone], Monday through Friday 6 am to 4 pm, or [place] at [facility] number at [phone].Take Tylenol or Norco if needed for pain.  Should not drive or do anything dangerous when taking Norco.  May shower and blot dry.  Wear bra as desired for comfort and support.    AMBULATORY SURGERY  DISCHARGE INSTRUCTIONS   5) The drugs that you were given will stay in your system until tomorrow so for the next 24 hours you should not:  D) Drive an automobile E) Make any legal decisions F) Drink any alcoholic beverage   6) You may resume regular meals tomorrow.  Today it is better to start with liquids and gradually work up to solid foods.  You may eat anything you prefer, but it is better to start with liquids, then soup and crackers, and gradually work up to solid foods.   7) Please notify your doctor immediately if you have any unusual bleeding, trouble breathing, redness and pain at the surgery site, drainage, fever, or pain not relieved by medication.    8) Additional Instructions:        Please contact your physician with any problems or Same Day  Surgery at [phone], Monday through Friday 6 am to 4 pm, or [place] at [facility] number at [phone].

## 2017-04-13 NOTE — Anesthesia Post-op Follow-up Note (Signed)
Anesthesia QCDR form completed.        

## 2017-04-13 NOTE — Op Note (Signed)
OPERATIVE REPORT  PREOPERATIVE  DIAGNOSIS: .  Left breast cancer  POSTOPERATIVE DIAGNOSIS: .  Left breast cancer  PROCEDURE: .  Left partial mastectomy with axillary sentinel lymph node biopsy  ANESTHESIA:  General  SURGEON: Rochel Brome  MD   INDICATIONS: .  She had recent mammogram findings of density in the upper inner quadrant of the left breast.  Ultrasound demonstrated a 6 mm mass.  Ultrasound-guided core biopsy demonstrated invasive mammary carcinoma.  Surgery was recommended for definitive treatment.  She had preoperative injection of radioactive technetium sulfur colloid.  She also had preoperative insertion of a Kopan's wire.  Mammogram images were reviewed.  With the patient on the operating table in the supine position under general anesthesia the left arm was placed on the lateral arm support.  The dressing was removed from the left breast.  The Kopan's wire was cut 2 cm from its entry point.  The breast was prepared with ChloraPrep and draped in a sterile manner  A curvilinear incision was made in the upper inner quadrant of the left breast 6 cm from the nipple carried down through subcutaneous tissues.  The Kopan's wire was encountered.  There was some firmness palpable at the site of the wire.  A mass of tissue surrounding the wire was dissected free from surrounding tissues..  During the course of dissection margin maps were sutured to the specimen marking the cranial caudal medial lateral superficial and deep margins.  After removing the specimen the wound was inspected and several small bleeding points were cauterized.  Hemostasis was subsequently intact.  The specimen was submitted for specimen mammogram which depicted the location of the biopsy marker in the specimen.  The specimen was carried to pathology.  The pathologist called and reported that the margins appeared to be satisfactory.  Attention was turned to the axilla which was probed with a gamma counter demonstrating  the location of radioactivity.  An incision was made in the inferior aspect of the axilla and dissected down through subcutaneous tissues.  Electrocautery was used for hemostasis.  A lymph node was found deep within the axilla adjacent to the rib cage which was dissected free from surrounding tissues.  One bleeding point was suture ligated with 3-0 chromic.  The lymph node was removed.  The ex vivo counts per second were in the range of 2400-2800.  The background count was minimal.  There was no remaining visible or palpable mass within the axilla.  Hemostasis was intact.  The partial mastectomy wound was further inspected and infiltrated subcuticular tissues with 0.5% Sensorcaine with epinephrine.  Also deeper tissues surrounding cautery artifact were infiltrated as well.  Hemostasis was intact.  Subcutaneous tissues were approximated with interrupted 3-0 chromic.  The skin was closed with running 4-0 Monocryl subcuticular suture.  The axillary wound was inspected and determined hemostasis was intact.  Subcuticular tissues were infiltrated with 0.5% Sensorcaine with epinephrine.  The wound was closed with interrupted 3-0 chromic subcutaneous sutures and running 4-0 Monocryl subcuticular suture.  Both wounds are treated with Dermabond  The patient tolerated the procedure satisfactorily and was prepared for transfer to the recovery room.  Darden Amber MD

## 2017-04-13 NOTE — Transfer of Care (Signed)
Immediate Anesthesia Transfer of Care Note  Patient: April Leblanc  Procedure(s) Performed: PARTIAL MASTECTOMY WITH NEEDLE LOCALIZATION (Left ) SENTINEL NODE BIOPSY (Left )  Patient Location: PACU  Anesthesia Type:General  Level of Consciousness: sedated  Airway & Oxygen Therapy: Patient Spontanous Breathing and Patient connected to face mask oxygen  Post-op Assessment: Report given to RN and Post -op Vital signs reviewed and stable  Post vital signs: Reviewed and stable  Last Vitals:  Vitals:   04/13/17 0959 04/13/17 1451  BP: (!) 139/94 (!) 145/82  Pulse: 70 86  Resp: 16 14  Temp: (!) 36 C (!) 36.3 C  SpO2: 97% 97%    Last Pain:  Vitals:   04/13/17 0959  TempSrc: Temporal  PainSc: 0-No pain         Complications: No apparent anesthesia complications

## 2017-04-13 NOTE — H&P (Signed)
  She comes in today for a left partial mastectomy with axillary sentinel lymph node biopsy.  She had recent findings of infiltrating mammary carcinoma of the upper outer quadrant of the left breast.  She has had x-ray needle localization.  I have reviewed the mammogram images.  She also had injection of radioactive technetium sulfur colloid.  She reports no change in overall condition since the recent office exam.  The left side was marked YES.  I discussed the plan for surgery

## 2017-04-13 NOTE — Anesthesia Procedure Notes (Signed)
Procedure Name: LMA Insertion Date/Time: 04/13/2017 12:55 PM Performed by: Justus Memory, CRNA Pre-anesthesia Checklist: Patient identified, Patient being monitored, Timeout performed, Emergency Drugs available and Suction available Patient Re-evaluated:Patient Re-evaluated prior to induction Oxygen Delivery Method: Circle system utilized Preoxygenation: Pre-oxygenation with 100% oxygen Induction Type: IV induction Ventilation: Mask ventilation without difficulty LMA: LMA inserted LMA Size: 4.5 Tube type: Oral Number of attempts: 1 Placement Confirmation: positive ETCO2 and breath sounds checked- equal and bilateral Tube secured with: Tape Dental Injury: Teeth and Oropharynx as per pre-operative assessment

## 2017-04-16 ENCOUNTER — Encounter: Payer: Self-pay | Admitting: Surgery

## 2017-04-16 ENCOUNTER — Ambulatory Visit: Payer: Medicare Other | Admitting: Oncology

## 2017-04-16 NOTE — Anesthesia Postprocedure Evaluation (Signed)
Anesthesia Post Note  Patient: April Leblanc  Procedure(s) Performed: PARTIAL MASTECTOMY WITH NEEDLE LOCALIZATION (Left ) SENTINEL NODE BIOPSY (Left )  Patient location during evaluation: PACU Anesthesia Type: General Level of consciousness: awake and alert Pain management: pain level controlled Vital Signs Assessment: post-procedure vital signs reviewed and stable Respiratory status: spontaneous breathing, nonlabored ventilation, respiratory function stable and patient connected to nasal cannula oxygen Cardiovascular status: blood pressure returned to baseline and stable Postop Assessment: no apparent nausea or vomiting Anesthetic complications: no     Last Vitals:  Vitals:   04/13/17 1555 04/13/17 1632  BP: (!) 158/90 128/78  Pulse: 84 80  Resp: 16 16  Temp: (!) 36.4 C   SpO2: 97% 97%    Last Pain:  Vitals:   04/13/17 1632  TempSrc:   PainSc: 1                  Molli Barrows

## 2017-04-17 LAB — SURGICAL PATHOLOGY

## 2017-04-27 ENCOUNTER — Encounter: Payer: Self-pay | Admitting: Oncology

## 2017-04-27 ENCOUNTER — Inpatient Hospital Stay (HOSPITAL_BASED_OUTPATIENT_CLINIC_OR_DEPARTMENT_OTHER): Payer: Medicare Other | Admitting: Oncology

## 2017-04-27 ENCOUNTER — Encounter: Payer: Self-pay | Admitting: *Deleted

## 2017-04-27 ENCOUNTER — Inpatient Hospital Stay: Payer: Medicare Other

## 2017-04-27 VITALS — BP 114/73 | HR 89 | Temp 98.3°F | Resp 16 | Wt 175.0 lb

## 2017-04-27 DIAGNOSIS — E538 Deficiency of other specified B group vitamins: Secondary | ICD-10-CM | POA: Diagnosis not present

## 2017-04-27 DIAGNOSIS — K219 Gastro-esophageal reflux disease without esophagitis: Secondary | ICD-10-CM

## 2017-04-27 DIAGNOSIS — R7303 Prediabetes: Secondary | ICD-10-CM

## 2017-04-27 DIAGNOSIS — Z170 Estrogen receptor positive status [ER+]: Secondary | ICD-10-CM

## 2017-04-27 DIAGNOSIS — G4730 Sleep apnea, unspecified: Secondary | ICD-10-CM

## 2017-04-27 DIAGNOSIS — Z803 Family history of malignant neoplasm of breast: Secondary | ICD-10-CM

## 2017-04-27 DIAGNOSIS — Z79811 Long term (current) use of aromatase inhibitors: Secondary | ICD-10-CM

## 2017-04-27 DIAGNOSIS — E785 Hyperlipidemia, unspecified: Secondary | ICD-10-CM | POA: Diagnosis not present

## 2017-04-27 DIAGNOSIS — Z7189 Other specified counseling: Secondary | ICD-10-CM

## 2017-04-27 DIAGNOSIS — E039 Hypothyroidism, unspecified: Secondary | ICD-10-CM | POA: Diagnosis not present

## 2017-04-27 DIAGNOSIS — Z780 Asymptomatic menopausal state: Secondary | ICD-10-CM

## 2017-04-27 DIAGNOSIS — N6489 Other specified disorders of breast: Secondary | ICD-10-CM

## 2017-04-27 DIAGNOSIS — M8588 Other specified disorders of bone density and structure, other site: Secondary | ICD-10-CM

## 2017-04-27 DIAGNOSIS — C50412 Malignant neoplasm of upper-outer quadrant of left female breast: Secondary | ICD-10-CM

## 2017-04-27 DIAGNOSIS — Z90710 Acquired absence of both cervix and uterus: Secondary | ICD-10-CM

## 2017-04-27 DIAGNOSIS — Z880 Allergy status to penicillin: Secondary | ICD-10-CM

## 2017-04-27 DIAGNOSIS — Z79899 Other long term (current) drug therapy: Secondary | ICD-10-CM

## 2017-04-27 NOTE — Progress Notes (Signed)
  Oncology Nurse Navigator Documentation  Navigator Location: [facility] (04/27/17 1500)   )                        Treatment Phase: Pre-Tx/Tx Discussion (04/27/17 1500) Barriers/Navigation Needs: No Needs (04/27/17 1500)                          Time Spent with Patient: 45 (04/27/17 1500)   Met patient and her husband during her post surgery follow up with Dr. Janese Banks.  Treatment plan for radiation and antihormonal therapy.  Patient is to call if she has any questions or needs.

## 2017-04-27 NOTE — Progress Notes (Signed)
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  Mammogram showed possible asymmetry in the right breast and possible mass and distortion in the left breast.  Diagnostic bilateral mammogram showed suspicious mass in the left breast at 10 o'clock position 6 x 4 x 5 mm in size.  No evidence of left axillary adenopathy.  Asymmetry seen in the right breast resolved on Tomosyntesis images and consistent with overlapping fibroglandular tissue.  Patient underwent ultrasound-guided core biopsy of the left breast lesion which showed invasive mammary carcinoma, grade 1.  Greater than 90% ER positive, 1% PR positive and HER-2 equivocal by IHC.  FISH testing for HER-2 is currently pending  She is G2 P2 L2.  Remote use of hormone contraception.  Menarche at the age of 27.  She had hysterectomy in her 75s.  No prior abnormal breast mammograms or breast biopsies.  Family history significant for prostate cancer in her brother.  Breast cancer in maternal aunt and 2 maternal first cousins.  She does not know if they have been tested for breast cancer  Patient underwent lumpectomy with sentinel lymph node biopsy on 04/13/2017.  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  Margins were negative for invasive and in situ carcinoma.  0 out of 1 sentinel lymph node  positive for malignancy.  ER greater than 90% positive PR 1% positive HER-2 FISH negative.  Margins were 2 mm for invasive carcinoma and DCIS 0.5 mm  Bone density scan in September 2016 showed osteopenia with a T score of -2.1 at the AP spine.  10-year probability of a major osteoporotic fracture was 16% and major hip fracture was 2% back then   Interval history- patient is doing well since her surgery. Denies any aches of pains anywhere  ECOG PS- 0 Pain scale- 0  Review of systems- Review of Systems  Constitutional: Negative for chills, fever, malaise/fatigue and weight loss.  HENT: Negative for congestion, ear discharge and nosebleeds.   Eyes: Negative for blurred vision.  Respiratory: Negative for cough, hemoptysis, sputum production, shortness of breath and wheezing.   Cardiovascular: Negative for chest pain, palpitations, orthopnea and claudication.  Gastrointestinal: Negative for abdominal pain, blood in stool, constipation, diarrhea, heartburn, melena, nausea and vomiting.  Genitourinary: Negative for dysuria, flank pain, frequency, hematuria and urgency.  Musculoskeletal: Negative for back pain, joint pain and myalgias.  Skin: Negative for rash.  Neurological: Negative for dizziness, tingling, focal weakness, seizures, weakness and headaches.  Endo/Heme/Allergies: Does not bruise/bleed easily.  Psychiatric/Behavioral: Negative for depression and suicidal ideas. The patient does not have insomnia.       Allergies  Allergen Reactions  . Amoxicillin Hives, Itching and Other (See Comments)    Has patient had a PCN reaction causing immediate rash, facial/tongue/throat swelling, SOB or lightheadedness with hypotension: No Has patient had a PCN reaction causing severe rash involving mucus membranes or skin necrosis: No  Has patient had a PCN reaction that required hospitalization: No Has patient had a PCN reaction occurring within the last 10 years: No If all of the above answers are  "NO", then may proceed with Cephalosporin use.   . Erythromycin Hives and Itching  . Sulfa Antibiotics Hives and Itching  . Iodinated Diagnostic Agents Rash    Red rash and itching. Topical iodine not a problem.  . Morphine Nausea And Vomiting and Other (See Comments)    Pretty severe vomiting. Can take hydrocodone and oxycodone       Past Medical History:  Diagnosis Date  . Anemia   . Ankle sprain 03/2017  . Arthritis   . Breast cancer ([place]) 2018  . Breast cancer ([place]) 03/2017  . Depression   . Fracture of distal end of radius 03/2017   left  . GERD (gastroesophageal reflux disease)   . Headache   . Hyperlipidemia   . Hypothyroidism   . Pre-diabetes 03/2017   follows healthy diabetic diet  . Sleep apnea    has not used cpap in a long time  . Vitamin B 12 deficiency      Past Surgical History:  Procedure Laterality Date  . ABDOMINAL HYSTERECTOMY  1983  . APPENDECTOMY  1977   when she had c-section  . BREAST BIOPSY Left    INVASIVE MAMMARY CARCINOMA Grade 1  . BREAST LUMPECTOMY Left 04/13/2017   INVASIVE MAMMARY CARCINOMA   . CESAREAN SECTION    . COLONOSCOPY    . ESOPHAGOGASTRODUODENOSCOPY (EGD) WITH PROPOFOL N/A 02/05/2015   Procedure: ESOPHAGOGASTRODUODENOSCOPY (EGD) WITH PROPOFOL;  Surgeon: Josefine Class, MD;  Location[facility]COPY;  Service: Endoscopy;  Laterality: N/A;  . PARTIAL MASTECTOMY WITH NEEDLE LOCALIZATION Left 04/13/2017   Procedure: PARTIAL MASTECTOMY WITH NEEDLE LOCALIZATION;  Surgeon: Leonie Green, MD;  Location: [facility] ORS;  Service: General;  Laterality: Left;  . PATELLA FRACTURE SURGERY Left 1991   screws in place  . SAVORY DILATION N/A 02/05/2015   Procedure: SAVORY DILATION;  Surgeon: Josefine Class, MD;  Location: [facility] ENDOSCOPY;  Service: Endoscopy;  Laterality: N/A;  . SENTINEL NODE BIOPSY Left 04/13/2017   Procedure: SENTINEL NODE BIOPSY;  Surgeon: Leonie Green, MD;  Location: [facility] ORS;  Service: General;   Laterality: Left;  . SINUS SURGERY WITH INSTATRAK      Social History   Socioeconomic History  . Marital status: Married    Spouse name: Not on file  . Number of children: Not on file  . Years of education: Not on file  . Highest education level: Not on file  Social Needs  . Financial resource strain: Not on file  . Food insecurity - worry: Not on file  . Food insecurity - inability: Not on file  . Transportation needs - medical: Not on file  . Transportation needs - non-medical: Not on file  Occupational History  . Not on file  Tobacco Use  . Smoking status: Never Smoker  . Smokeless tobacco: Never Used  Substance and Sexual Activity  . Alcohol use: No    Frequency: Never  . Drug use: No  . Sexual activity: Not on file  Other Topics Concern  . Not on file  Social History Narrative  . Not on file    Family History  Problem Relation Age of Onset  . Breast cancer Maternal Aunt 70       deceased 84s  . Breast cancer Cousin 10       daughter  of mat aunt with breast cancer  . Breast cancer Cousin 69       daughter of mat aunt with breast cancer  . Deep vein thrombosis Mother   . Heart disease Mother   . Stroke Mother   . Heart disease Father   . Parkinson's disease Father   . Heart disease Sister   . Stroke Sister   . Skin cancer Brother   . Heart disease Brother   . Alzheimer's disease Brother   . Lung cancer Paternal Uncle        deceased 35s  . Heart disease Brother   . Alzheimer's disease Brother   . Heart disease Brother   . Alzheimer's disease Brother   . Cancer Brother        unclear primary; currently 82s  . Cirrhosis Sister   . Alzheimer's disease Sister   . Kidney cancer Son 49       nephrectomy @ [facility]     Current Outpatient Medications:  .  acetaminophen (TYLENOL) 500 MG tablet, Take 1,000 mg by mouth every 6 (six) hours as needed for moderate pain or headache., Disp: , Rfl:  .  amphetamine-dextroamphetamine (ADDERALL XR) 10 MG 24 hr capsule,  Take 10 mg by mouth daily., Disp: , Rfl:  .  atorvastatin (LIPITOR) 40 MG tablet, Take 40 mg by mouth daily. , Disp: , Rfl:  .  darifenacin (ENABLEX) 15 MG 24 hr tablet, Take 15 mg by mouth daily., Disp: , Rfl:  .  esomeprazole (NEXIUM) 40 MG capsule, Take 40 mg by mouth daily. , Disp: , Rfl:  .  fluticasone (FLONASE) 50 MCG/ACT nasal spray, Place 2 sprays into both nostrils daily as needed for allergies. , Disp: , Rfl:  .  HYDROcodone-acetaminophen (NORCO) 5-325 MG tablet, Take 1-2 tablets by mouth every 6 (six) hours as needed for moderate pain., Disp: 12 tablet, Rfl: 0 .  levothyroxine (SYNTHROID, LEVOTHROID) 150 MCG tablet, Take 150 mcg by mouth daily before breakfast., Disp: , Rfl:  .  LORazepam (ATIVAN) 0.5 MG tablet, Take 0.5 mg by mouth 2 (two) times daily. May take up to three times a day if needed., Disp: , Rfl:  .  montelukast (SINGULAIR) 10 MG tablet, Take 10 mg by mouth daily. , Disp: , Rfl:  .  ranitidine (ZANTAC) 300 MG capsule, Take 300 mg by mouth at bedtime. , Disp: , Rfl:  .  traZODone (DESYREL) 100 MG tablet, Take 100 mg by mouth at bedtime., Disp: , Rfl:  .  vitamin B-12 (CYANOCOBALAMIN) 1000 MCG tablet, Take 1,000 mcg by mouth daily., Disp: , Rfl:  .  Vortioxetine HBr (TRINTELLIX) 20 MG TABS, Take 20 mg by mouth daily., Disp: , Rfl:   Physical exam:  Vitals:   04/27/17 1443  BP: 114/73  Pulse: 89  Resp: 16  Temp: 98.3 F (36.8 C)  TempSrc: Tympanic  Weight: 175 lb (79.4 kg)   Physical Exam  Constitutional: She is oriented to person, place, and time and well-developed, well-nourished, and in no distress.  HENT:  Head: Normocephalic and atraumatic.  Eyes: EOM are normal. Pupils are equal, round, and reactive to light.  Neck: Normal range of motion.  Cardiovascular: Normal rate, regular rhythm and normal heart sounds.  Pulmonary/Chest: Effort normal and breath sounds normal.  Abdominal: Soft. Bowel sounds are normal.  Neurological: She is alert and oriented to  person, place, and time.  Skin: Skin is warm and dry.   Breast exam was performed in seated and  lying down position. Patient is status post left lumpectomy with a well-healed surgical scar. No evidence of any palpable masses. No evidence of axillary adenopathy. No evidence of any palpable masses or lumps in the right breast. No evidence of right axillary adenopathy   CMP Latest Ref Rng & Units 04/12/2017  Glucose 65 - 99 mg/dL 98  BUN 6 - 20 mg/dL 19  Creatinine 0.44 - 1.00 mg/dL 0.65  Sodium 135 - 145 mmol/L 137  Potassium 3.5 - 5.1 mmol/L 4.0  Chloride 101 - 111 mmol/L 102  CO2 22 - 32 mmol/L 25  Calcium 8.9 - 10.3 mg/dL 9.3   CBC Latest Ref Rng & Units 04/12/2017  WBC 3.6 - 11.0 K/uL 9.4  Hemoglobin 12.0 - 16.0 g/dL 13.8  Hematocrit 35.0 - 47.0 % 41.2  Platelets 150 - 440 K/uL 239    No images are attached to the encounter.  Nm Sentinel Node Injection  Result Date: 04/13/2017 CLINICAL DATA:  Left breast cancer. EXAM: NUCLEAR MEDICINE BREAST LYMPHOSCINTIGRAPHY TECHNIQUE: Intradermal injection of radiopharmaceutical was performed at the 12 o'clock, 3 o'clock, 6 o'clock, and 9 o'clock positions around the left nipple. The patient was then sent to the operating room where the sentinel node(s) were identified and removed by the surgeon. RADIOPHARMACEUTICALS:  Total of 1 mCi Millipore-filtered Technetium-22msulfur colloid, injected in four aliquots of 0.25 mCi each. IMPRESSION: Uncomplicated intradermal injection of a total of 1 mCi Technetium-982mulfur colloid for purposes of sentinel node identification. Electronically Signed   By: GlAletta Edouard.D.   On: 04/13/2017 11:25   Mm Breast Surgical Specimen  Result Date: 04/13/2017 CLINICAL DATA:  Patient is post needle localization and subsequent surgical excision of biopsy-proven malignancy over the inner upper left breast. EXAM: SPECIMEN RADIOGRAPH OF THE LEFT BREAST COMPARISON:  Previous exam(s). FINDINGS: Status post excision of  the left breast. The wire tip and biopsy marker clip are present and are marked for pathology. Findings were communicated to the OR at the time dictation. IMPRESSION: Specimen radiograph of the left breast. Electronically Signed   By: DaMarin Olp.D.   On: 04/13/2017 13:56     Assessment and plan- Patient is a [age].o. female with pathologically prognostic stage Ia invasive mammary carcinoma of the right breast p T1b p N0 c M0 ER PR positive HER-2/neu negative status post lumpectomy  I discussed the results of the biopsy with the patient in detail.  Her margins were adequate for invasive carcinoma but close for DCIS as they were less than 2 mm.  I will touch base with Dr. SmTamala Juliano see if he would like to do a reexcision surgery for her DCIS.  If no reexcision surgery is planned she will need radiation boost at the close margin for DCIS  Based on TAILOR X trial patient does not meet criteria for Oncotype testing as she had a grade 1 tumor that was 6 mm and tailor Rx trial included only tumors more than 1 cm of grade 1.  I therefore do not think that patient needs adjuvant chemotherapy.  Given that her tumor was ER positive there would be a role for hormone therapy for at least 5 years.    Given that patient is postmenopausal I would recommend aromatase inhibitors over tamoxifen.  Discussed risks and benefits of AI including all but not limited to fatigue, hot flashes, arthralgias, hypercholesterolemia and worsening bone health.  Patient understands and agrees to proceed.  We will obtain a baseline bone density scan  at this time since last bone density scan in 2016 did show significant osteopenia.    Based on recent NEJM trial published a few days ago by Teddy Spike al which showed significant fracture risk reduction in women >66 yrs of age with osetopenia, I would favor using zometa 5 mg Q18 months for 6 years.  Discussed risks and benefits of Zometa including all but not limited to hypocalcemia and risk of  osteonecrosis of the jaw.  We will obtain dental clearance from Dr. Ronita Hipps who is her dentist and will be seeing her soon next month.  Patient understands and agrees to proceed  Treatment will be given with curative intent.  She continues to take hormone therapy either along with radiation or wait for radiation to be completed  I will refer her to radiation oncology for consideration of adjuvant radiation  I will see her back in 3 weeks time after her bone density scan is completed to discuss the results of the test.  Written information about Arimidex has been given to the patient  Cancer Staging Malignant neoplasm of upper-outer quadrant of left breast in female, estrogen receptor positive ([place]) Staging form: Breast, AJCC 8th Edition - Clinical stage from 04/02/2017: Stage IA (cT1b, cN0, cM0, G1, ER: Positive, PR: Positive, HER2: Equivocal) - Signed by Sindy Guadeloupe, MD on 04/02/2017 - Pathologic stage from 04/27/2017: Stage IA (pT1b, pN0, cM0, G1, ER: Positive, PR: Positive, HER2: Negative) - Signed by Sindy Guadeloupe, MD on 04/27/2017     Visit Diagnosis 1. Malignant neoplasm of upper-outer quadrant of left breast in female, estrogen receptor positive ([place])   2. Goals of care, counseling/discussion   3. Osteopenia of lumbar spine      Dr. Randa Evens, MD, MPH [facility] at [facility] Pager- [phone] 04/27/2017 11:47 AM

## 2017-04-30 ENCOUNTER — Telehealth: Payer: Self-pay | Admitting: *Deleted

## 2017-04-30 NOTE — Telephone Encounter (Signed)
Tried to call patient to make her aware of her  Bone Density Study. However, I was unable to leave a message due to  Patient vmail was not setup to leave messages. Will try to call back and also a  Letter will be mail out today.

## 2017-05-04 ENCOUNTER — Encounter: Payer: Self-pay | Admitting: Genetic Counselor

## 2017-05-04 ENCOUNTER — Telehealth: Payer: Self-pay | Admitting: Genetic Counselor

## 2017-05-04 DIAGNOSIS — Z1379 Encounter for other screening for genetic and chromosomal anomalies: Secondary | ICD-10-CM

## 2017-05-04 HISTORY — DX: Encounter for other screening for genetic and chromosomal anomalies: Z13.79

## 2017-05-04 NOTE — Telephone Encounter (Signed)
Cancer Genetics             Telegenetics Results Disclosure   Patient Name: April Leblanc Patient DOB: [birth year] Patient Age: 67 y.o. Phone Call Date: 05/04/2017  Referring Provider: Randa Evens, MD    April Leblanc genetic test results are now available. Please see the Genetics telephone note from 04/06/2017 for a detailed discussion of her personal and family histories and the recommendations provided.  Genetic Testing: At the time of April Leblanc's telegenetics visit, she decided to pursue genetic testing of multiple genes associated with hereditary susceptibility to cancer. Testing included sequencing and deletion/duplication analysis. Testing did not reveal any pathogenic mutation in any of these genes.  A copy of the genetic test report will be scanned into Epic under the Media tab.  The genes analyzed were the 83 genes on [facility]'s Multi-Cancer panel (ALK, APC, ATM, AXIN2, BAP1, BARD1, BLM, BMPR1A, BRCA1, BRCA2, BRIP1, CASR, CDC73, CDH1, CDK4, CDKN1B, CDKN1C, CDKN2A, CEBPA, CHEK2, CTNNA1, DICER1, DIS3L2, EGFR, EPCAM, FH, FLCN, GATA2, GPC3, GREM1, HOXB13, HRAS, KIT, MAX, MEN1, MET, MITF, MLH1, MSH2, MSH3, MSH6, MUTYH, NBN, NF1, NF2, NTHL1, PALB2, PDGFRA, PHOX2B, PMS2, POLD1, POLE, POT1, PRKAR1A, PTCH1, PTEN, RAD50, RAD51C, RAD51D, RB1, RECQL4, RET, RUNX1, SDHA, SDHAF2, SDHB, SDHC, SDHD, SMAD4, SMARCA4, SMARCB1, SMARCE1, STK11, SUFU, TERC, TERT, TMEM127, TP53, TSC1, TSC2, VHL, WRN, WT1).  Since the current test is not perfect, it is possible that there may be a gene mutation that current testing cannot detect, but that chance is small. It is possible that a different genetic factor, which has not yet been discovered or is not on this panel, is responsible for the cancer diagnoses in the family. Again, the likelihood of this is low. No additional testing is recommended at this time for April Leblanc.  Cancer Screening: These results suggest that April Leblanc's cancer was  most likely not due to an inherited predisposition. Most cancers happen by chance and this test, along with details of her family history, suggests that her cancer falls into this category. We discussed continuing to follow the cancer screening guidelines provided by her physician.   Family Members: Family members are at some increased risk of developing cancer, over the general population risk, simply due to the family history. Family members are recommended to speak with their own providers about appropriate cancer screenings.  Any relative who had cancer at a young age or had a particularly rare cancer may also wish to pursue genetic testing. This would be beneficial for her son who reportedly had kidney cancer at age 32. Genetic counselors can be located in other cities, by visiting the website of the [employer] ([employer]ist[employer]se) and Field seismologist for a Dietitian by zip code.   Lastly, cancer genetics is a rapidly advancing field and it is possible that new genetic tests will be appropriate for April Leblanc in the future. We encourage April Leblanc to remain in contact with Genetics on an annual basis so her personal and family histories can be updated.    Steele Berg, MS, [place] Certified Genetic Counselor phone: [phone]

## 2017-05-07 ENCOUNTER — Encounter: Payer: Self-pay | Admitting: Oncology

## 2017-05-09 ENCOUNTER — Other Ambulatory Visit: Payer: Self-pay

## 2017-05-09 ENCOUNTER — Encounter: Payer: Self-pay | Admitting: Radiation Oncology

## 2017-05-09 ENCOUNTER — Ambulatory Visit
Admission: RE | Admit: 2017-05-09 | Discharge: 2017-05-09 | Disposition: A | Discharge status: Home / Self Care | Payer: Medicare Other | Source: Ambulatory Visit | Attending: Radiation Oncology | Admitting: Radiation Oncology

## 2017-05-09 VITALS — BP 129/80 | HR 81 | Temp 97.6°F | Resp 20 | Wt 175.4 lb

## 2017-05-09 DIAGNOSIS — M129 Arthropathy, unspecified: Secondary | ICD-10-CM | POA: Insufficient documentation

## 2017-05-09 DIAGNOSIS — R51 Headache: Secondary | ICD-10-CM | POA: Insufficient documentation

## 2017-05-09 DIAGNOSIS — G4730 Sleep apnea, unspecified: Secondary | ICD-10-CM | POA: Insufficient documentation

## 2017-05-09 DIAGNOSIS — Z8781 Personal history of (healed) traumatic fracture: Secondary | ICD-10-CM | POA: Insufficient documentation

## 2017-05-09 DIAGNOSIS — Z170 Estrogen receptor positive status [ER+]: Secondary | ICD-10-CM | POA: Insufficient documentation

## 2017-05-09 DIAGNOSIS — F329 Major depressive disorder, single episode, unspecified: Secondary | ICD-10-CM | POA: Insufficient documentation

## 2017-05-09 DIAGNOSIS — Z9012 Acquired absence of left breast and nipple: Secondary | ICD-10-CM | POA: Insufficient documentation

## 2017-05-09 DIAGNOSIS — Z79899 Other long term (current) drug therapy: Secondary | ICD-10-CM | POA: Insufficient documentation

## 2017-05-09 DIAGNOSIS — Z8051 Family history of malignant neoplasm of kidney: Secondary | ICD-10-CM | POA: Insufficient documentation

## 2017-05-09 DIAGNOSIS — E785 Hyperlipidemia, unspecified: Secondary | ICD-10-CM | POA: Insufficient documentation

## 2017-05-09 DIAGNOSIS — Z510 Encounter for antineoplastic radiation therapy: Secondary | ICD-10-CM | POA: Insufficient documentation

## 2017-05-09 DIAGNOSIS — Z9049 Acquired absence of other specified parts of digestive tract: Secondary | ICD-10-CM | POA: Insufficient documentation

## 2017-05-09 DIAGNOSIS — K219 Gastro-esophageal reflux disease without esophagitis: Secondary | ICD-10-CM | POA: Insufficient documentation

## 2017-05-09 DIAGNOSIS — E559 Vitamin D deficiency, unspecified: Secondary | ICD-10-CM | POA: Insufficient documentation

## 2017-05-09 DIAGNOSIS — C50412 Malignant neoplasm of upper-outer quadrant of left female breast: Secondary | ICD-10-CM

## 2017-05-09 DIAGNOSIS — Z801 Family history of malignant neoplasm of trachea, bronchus and lung: Secondary | ICD-10-CM | POA: Insufficient documentation

## 2017-05-09 DIAGNOSIS — Z803 Family history of malignant neoplasm of breast: Secondary | ICD-10-CM | POA: Insufficient documentation

## 2017-05-09 DIAGNOSIS — E039 Hypothyroidism, unspecified: Secondary | ICD-10-CM | POA: Insufficient documentation

## 2017-05-09 NOTE — Consult Note (Signed)
NEW PATIENT EVALUATION  Name: April Leblanc  MRN: [id number]  Date:   05/09/2017     DOB: [birth year]   This 67 y.o. 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  REFERRING PHYSICIAN: Clarisse Gouge, MD  CHIEF COMPLAINT:  Chief Complaint  Patient presents with  . Breast Cancer    Pt is here for initial consultation of breast cancer    DIAGNOSIS: The encounter diagnosis was Malignant neoplasm of upper-outer quadrant of left breast in female, estrogen receptor positive ([place]).   PREVIOUS INVESTIGATIONS:  Mammogram and ultrasound reviewed Pathology reports reviewed Clinical notes reviewed  HPI: Patient is a 67 year old female who presented with an abnormal mammogram back in October 2018 showing asymmetry in the right breast and possible mild distortion the left breast. Bilateral diagnostic mammograms showed a suspicious lesion at the 10:00 position of the left breast 6 x 4 mm in size. No evidence of adenopathy was seen. The asymmetry the right breast had resolved. She underwent ultrasound-guided biopsy showing invasive mammary carcinoma grade 1. Tumor was ER positive PR borderline HER-2/neu negative by fish. 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 One sentinel lymph node was negative for metastatic disease. She's done well postoperatively. She's been seen by medical oncology and as discussed the risks and benefits of antiestrogen therapy. She is now referred to radiation oncology for consideration of treatment. Systemic chemotherapy is not indicated. Patient is seen today and doing well she specifically denies breast tenderness cough or bone pain.  PLANNED TREATMENT  REGIMEN: Left breast whole breast radiation  PAST MEDICAL HISTORY:  has a past medical history of Anemia, Ankle sprain (03/2017), Arthritis, Breast cancer ([place]) (2018), Breast cancer ([place]) (03/2017), Depression, Fracture of distal end of radius (03/2017), Genetic testing (05/04/2017), GERD (gastroesophageal reflux disease), Headache, Hyperlipidemia, Hypothyroidism, Pre-diabetes (03/2017), Sleep apnea, and Vitamin B 12 deficiency.    PAST SURGICAL HISTORY:  Past Surgical History:  Procedure Laterality Date  . ABDOMINAL HYSTERECTOMY  1983  . APPENDECTOMY  1977   when she had c-section  . BREAST BIOPSY Left    INVASIVE MAMMARY CARCINOMA Grade 1  . BREAST LUMPECTOMY Left 04/13/2017   INVASIVE MAMMARY CARCINOMA   . CESAREAN SECTION    . COLONOSCOPY    . ESOPHAGOGASTRODUODENOSCOPY (EGD) WITH PROPOFOL N/A 02/05/2015   Procedure: ESOPHAGOGASTRODUODENOSCOPY (EGD) WITH PROPOFOL;  Surgeon: Josefine Class, MD;  Location: [facility] ENDOSCOPY;  Service: Endoscopy;  Laterality: N/A;  . PARTIAL MASTECTOMY WITH NEEDLE LOCALIZATION Left 04/13/2017   Procedure: PARTIAL MASTECTOMY WITH NEEDLE LOCALIZATION;  Surgeon: Leonie Green, MD;  Location: [facility] ORS;  Service: General;  Laterality: Left;  . PATELLA FRACTURE SURGERY Left 1991   screws in place  . SAVORY DILATION N/A 02/05/2015   Procedure: SAVORY DILATION;  Surgeon: Josefine Class, MD;  Location: [facility] ENDOSCOPY;  Service: Endoscopy;  Laterality: N/A;  . SENTINEL NODE BIOPSY Left 04/13/2017   Procedure: SENTINEL NODE BIOPSY;  Surgeon: Leonie Green, MD;  Location: [facility] ORS;  Service: General;  Laterality: Left;  . SINUS SURGERY WITH INSTATRAK      FAMILY HISTORY: family history includes Alzheimer's disease in her brother, brother, brother, and sister; Breast cancer (age of onset:  59) in her cousin; Breast cancer (age of onset: 70) in her cousin; Breast cancer (age of onset: 64) in her maternal aunt; Cancer in her brother; Cirrhosis in  her sister; Deep vein thrombosis in her mother; Heart disease in her brother, brother, brother, father, mother, and sister; Kidney cancer (age of onset: 27) in her son; Lung cancer in her paternal uncle; Parkinson's disease in her father; Skin cancer in her brother; Stroke in her mother and sister.  SOCIAL HISTORY:  reports that  has never smoked. she has never used smokeless tobacco. She reports that she does not drink alcohol or use drugs.  ALLERGIES: Amoxicillin; Erythromycin; Sulfa antibiotics; Iodinated diagnostic agents; and Morphine  MEDICATIONS:  Current Outpatient Medications  Medication Sig Dispense Refill  . amphetamine-dextroamphetamine (ADDERALL XR) 10 MG 24 hr capsule Take 10 mg by mouth daily.    Marland Kitchen atorvastatin (LIPITOR) 40 MG tablet Take 40 mg by mouth daily.     Marland Kitchen darifenacin (ENABLEX) 15 MG 24 hr tablet Take 15 mg by mouth daily.    Marland Kitchen esomeprazole (NEXIUM) 40 MG capsule Take 40 mg by mouth daily.     . Estradiol 10 MCG TABS vaginal tablet Place vaginally.    . fluticasone (FLONASE) 50 MCG/ACT nasal spray Place 2 sprays into both nostrils daily as needed for allergies.     Marland Kitchen levothyroxine (SYNTHROID, LEVOTHROID) 150 MCG tablet Take 150 mcg by mouth daily before breakfast.    . LORazepam (ATIVAN) 0.5 MG tablet Take 0.5 mg by mouth 2 (two) times daily. May take up to three times a day if needed.    . montelukast (SINGULAIR) 10 MG tablet Take 10 mg by mouth daily.     . ranitidine (ZANTAC) 300 MG capsule Take 300 mg by mouth at bedtime.     . traZODone (DESYREL) 100 MG tablet Take 100 mg by mouth at bedtime.    . vitamin B-12 (CYANOCOBALAMIN) 1000 MCG tablet Take 1,000 mcg by mouth daily.    . Vitamin D, Ergocalciferol, (DRISDOL) 50000 units CAPS capsule Take 50,000 Units by mouth every 7 (seven) days.    . Vortioxetine HBr (TRINTELLIX) 20 MG TABS Take 20 mg by mouth daily.    Marland Kitchen acetaminophen (TYLENOL) 500 MG tablet Take 1,000 mg by mouth every 6 (six) hours as needed for  moderate pain or headache.     No current facility-administered medications for this encounter.     ECOG PERFORMANCE STATUS:  0 - Asymptomatic  REVIEW OF SYSTEMS:  Patient denies any weight loss, fatigue, weakness, fever, chills or night sweats. Patient denies any loss of vision, blurred vision. Patient denies any ringing  of the ears or hearing loss. No irregular heartbeat. Patient denies heart murmur or history of fainting. Patient denies any chest pain or pain radiating to her upper extremities. Patient denies any shortness of breath, difficulty breathing at night, cough or hemoptysis. Patient denies any swelling in the lower legs. Patient denies any nausea vomiting, vomiting of blood, or coffee ground material in the vomitus. Patient denies any stomach pain. Patient states has had normal bowel movements no significant constipation or diarrhea. Patient denies any dysuria, hematuria or significant nocturia. Patient denies any problems walking, swelling in the joints or loss of balance. Patient denies any skin changes, loss of hair or loss of weight. Patient denies any excessive worrying or anxiety or significant depression. Patient denies any problems with insomnia. Patient denies excessive thirst, polyuria, polydipsia. Patient denies any swollen glands, patient denies easy bruising  or easy bleeding. Patient denies any recent infections, allergies or URI. Patient "s visual fields have not changed significantly in recent time.    PHYSICAL EXAM: BP 129/80   Pulse 81   Temp 97.6 F (36.4 C)   Resp 20   Wt 175 lb 6 oz (79.5 kg)   BMI 27.06 kg/m  Left breast is wide local excision scar which is healing well. No dominant mass or nodularity is noted in either breast in 2 positions examined. No axillary or supraclavicular adenopathy is appreciated. Well-developed well-nourished patient in NAD. HEENT reveals PERLA, EOMI, discs not visualized.  Oral cavity is clear. No oral mucosal lesions are  identified. Neck is clear without evidence of cervical or supraclavicular adenopathy. Lungs are clear to A&P. Cardiac examination is essentially unremarkable with regular rate and rhythm without murmur rub or thrill. Abdomen is benign with no organomegaly or masses noted. Motor sensory and DTR levels are equal and symmetric in the upper and lower extremities. Cranial nerves II through XII are grossly intact. Proprioception is intact. No peripheral adenopathy or edema is identified. No motor or sensory levels are noted. Crude visual fields are within normal range.  LABORATORY DATA: Pathology reports reviewed    RADIOLOGY RESULTS: Mammograms and ultrasound reviewed   IMPRESSION: Stage I (T1 1 N0 M0) grade 1 invasive mammary carcinoma of the left breast status post wide local excision and sentinel node biopsy ER/PR positive PR borderline HER-2/neu not overexpressed in 67 year old female  PLAN: At this time I have recommended whole breast radiation. 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 Do not believe she needs reexcision although I would opt to treat her whole breast to 5040 cGy in 28 fractions based on the large breast volume. I would also boost her scar another 1600 cGy using electron beam based on the close DCIS margin. Risks and benefits of treatment including skin reaction fatigue alteration of blood counts possible inclusion of superficial lung all were discussed in detail with the patient. She seems to comprehend my treatment plan well. I have personally ordered and scheduled CT simulation for next week. Patient also will be candidate for antiestrogen therapy after completion of radiation.  I would like to take this opportunity to thank you for allowing me to participate in the care of your patient.Noreene Filbert, MD

## 2017-05-16 ENCOUNTER — Ambulatory Visit
Admission: RE | Admit: 2017-05-16 | Discharge: 2017-05-16 | Disposition: A | Discharge status: Home / Self Care | Payer: Medicare Other | Source: Ambulatory Visit | Attending: Radiation Oncology | Admitting: Radiation Oncology

## 2017-05-16 ENCOUNTER — Inpatient Hospital Stay: Admission: RE | Admit: 2017-05-16 | Payer: Medicare Other | Source: Ambulatory Visit

## 2017-05-16 DIAGNOSIS — Z170 Estrogen receptor positive status [ER+]: Secondary | ICD-10-CM | POA: Diagnosis not present

## 2017-05-16 DIAGNOSIS — K219 Gastro-esophageal reflux disease without esophagitis: Secondary | ICD-10-CM | POA: Diagnosis not present

## 2017-05-16 DIAGNOSIS — E039 Hypothyroidism, unspecified: Secondary | ICD-10-CM | POA: Diagnosis not present

## 2017-05-16 DIAGNOSIS — Z8051 Family history of malignant neoplasm of kidney: Secondary | ICD-10-CM | POA: Diagnosis not present

## 2017-05-16 DIAGNOSIS — C50412 Malignant neoplasm of upper-outer quadrant of left female breast: Secondary | ICD-10-CM | POA: Diagnosis not present

## 2017-05-16 DIAGNOSIS — Z79899 Other long term (current) drug therapy: Secondary | ICD-10-CM | POA: Diagnosis not present

## 2017-05-16 DIAGNOSIS — Z9049 Acquired absence of other specified parts of digestive tract: Secondary | ICD-10-CM | POA: Diagnosis not present

## 2017-05-16 DIAGNOSIS — Z803 Family history of malignant neoplasm of breast: Secondary | ICD-10-CM | POA: Diagnosis not present

## 2017-05-16 DIAGNOSIS — E785 Hyperlipidemia, unspecified: Secondary | ICD-10-CM | POA: Diagnosis not present

## 2017-05-16 DIAGNOSIS — F329 Major depressive disorder, single episode, unspecified: Secondary | ICD-10-CM | POA: Diagnosis not present

## 2017-05-16 DIAGNOSIS — Z9012 Acquired absence of left breast and nipple: Secondary | ICD-10-CM | POA: Diagnosis not present

## 2017-05-16 DIAGNOSIS — Z8781 Personal history of (healed) traumatic fracture: Secondary | ICD-10-CM | POA: Diagnosis not present

## 2017-05-16 DIAGNOSIS — Z510 Encounter for antineoplastic radiation therapy: Secondary | ICD-10-CM | POA: Diagnosis present

## 2017-05-16 DIAGNOSIS — E559 Vitamin D deficiency, unspecified: Secondary | ICD-10-CM | POA: Diagnosis not present

## 2017-05-16 DIAGNOSIS — Z801 Family history of malignant neoplasm of trachea, bronchus and lung: Secondary | ICD-10-CM | POA: Diagnosis not present

## 2017-05-16 DIAGNOSIS — M129 Arthropathy, unspecified: Secondary | ICD-10-CM | POA: Diagnosis not present

## 2017-05-16 DIAGNOSIS — R51 Headache: Secondary | ICD-10-CM | POA: Diagnosis not present

## 2017-05-16 DIAGNOSIS — G4730 Sleep apnea, unspecified: Secondary | ICD-10-CM | POA: Diagnosis not present

## 2017-05-17 ENCOUNTER — Ambulatory Visit
Admission: RE | Admit: 2017-05-17 | Discharge: 2017-05-17 | Disposition: A | Discharge status: Home / Self Care | Payer: Medicare Other | Source: Ambulatory Visit | Attending: Oncology | Admitting: Oncology

## 2017-05-17 DIAGNOSIS — Z170 Estrogen receptor positive status [ER+]: Secondary | ICD-10-CM | POA: Insufficient documentation

## 2017-05-17 DIAGNOSIS — M8589 Other specified disorders of bone density and structure, multiple sites: Secondary | ICD-10-CM | POA: Insufficient documentation

## 2017-05-17 DIAGNOSIS — M8588 Other specified disorders of bone density and structure, other site: Secondary | ICD-10-CM | POA: Diagnosis present

## 2017-05-17 DIAGNOSIS — C50412 Malignant neoplasm of upper-outer quadrant of left female breast: Secondary | ICD-10-CM | POA: Diagnosis not present

## 2017-05-18 ENCOUNTER — Inpatient Hospital Stay: Payer: Medicare Other | Attending: Oncology | Admitting: Oncology

## 2017-05-18 ENCOUNTER — Other Ambulatory Visit: Payer: Self-pay | Admitting: *Deleted

## 2017-05-18 VITALS — BP 117/80 | HR 85 | Temp 98.5°F | Resp 18 | Wt 173.0 lb

## 2017-05-18 DIAGNOSIS — Z808 Family history of malignant neoplasm of other organs or systems: Secondary | ICD-10-CM

## 2017-05-18 DIAGNOSIS — Z801 Family history of malignant neoplasm of trachea, bronchus and lung: Secondary | ICD-10-CM | POA: Diagnosis not present

## 2017-05-18 DIAGNOSIS — C50412 Malignant neoplasm of upper-outer quadrant of left female breast: Secondary | ICD-10-CM | POA: Diagnosis present

## 2017-05-18 DIAGNOSIS — Z8042 Family history of malignant neoplasm of prostate: Secondary | ICD-10-CM | POA: Diagnosis not present

## 2017-05-18 DIAGNOSIS — Z79899 Other long term (current) drug therapy: Secondary | ICD-10-CM | POA: Diagnosis not present

## 2017-05-18 DIAGNOSIS — Z170 Estrogen receptor positive status [ER+]: Secondary | ICD-10-CM | POA: Diagnosis not present

## 2017-05-18 DIAGNOSIS — E039 Hypothyroidism, unspecified: Secondary | ICD-10-CM

## 2017-05-18 DIAGNOSIS — Z79811 Long term (current) use of aromatase inhibitors: Secondary | ICD-10-CM | POA: Diagnosis not present

## 2017-05-18 DIAGNOSIS — Z90710 Acquired absence of both cervix and uterus: Secondary | ICD-10-CM | POA: Insufficient documentation

## 2017-05-18 DIAGNOSIS — G4730 Sleep apnea, unspecified: Secondary | ICD-10-CM | POA: Diagnosis not present

## 2017-05-18 DIAGNOSIS — R7303 Prediabetes: Secondary | ICD-10-CM | POA: Diagnosis not present

## 2017-05-18 DIAGNOSIS — M8580 Other specified disorders of bone density and structure, unspecified site: Secondary | ICD-10-CM | POA: Insufficient documentation

## 2017-05-18 DIAGNOSIS — E538 Deficiency of other specified B group vitamins: Secondary | ICD-10-CM | POA: Diagnosis not present

## 2017-05-18 DIAGNOSIS — E785 Hyperlipidemia, unspecified: Secondary | ICD-10-CM

## 2017-05-18 DIAGNOSIS — Z803 Family history of malignant neoplasm of breast: Secondary | ICD-10-CM | POA: Diagnosis not present

## 2017-05-18 DIAGNOSIS — M1990 Unspecified osteoarthritis, unspecified site: Secondary | ICD-10-CM | POA: Insufficient documentation

## 2017-05-18 DIAGNOSIS — K219 Gastro-esophageal reflux disease without esophagitis: Secondary | ICD-10-CM | POA: Insufficient documentation

## 2017-05-18 DIAGNOSIS — C50212 Malignant neoplasm of upper-inner quadrant of left female breast: Secondary | ICD-10-CM

## 2017-05-18 NOTE — Progress Notes (Signed)
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 Mammogram showed possible asymmetry in the right breast and possible mass and distortion in the left breast. Diagnostic bilateral mammogram showed suspicious mass in the left breast at 10 o'clock position 6 x 4 x 5 mm in size. No evidence of left axillary adenopathy. Asymmetry seen in the right breast resolved on Tomosyntesis images and consistent with overlapping fibroglandular tissue.  Patient underwent ultrasound-guided core biopsy of the left breast lesion which showed invasive mammary carcinoma, grade 1. Greater than 90% ER positive, 1% PR positive and HER-2 equivocal by IHC. FISH testing for HER-2 is currently pending  She is G2 P2 L2. Remote use of hormone contraception. Menarche at the age of 53. She had hysterectomy in her 36s. No prior abnormal breast mammograms or breast biopsies. Family history significant for prostate cancer in her brother. Breast cancer in maternal aunt and 2 maternal first cousins. She does not know if they have been tested for breast cancer  Patient underwent lumpectomy with sentinel lymph node biopsy on 04/13/2017.  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  Margins were negative for invasive and in situ carcinoma.  0 out of 1 sentinel lymph node positive for  malignancy.  ER greater than 90% positive PR 1% positive HER-2 FISH negative.  Margins were 2 mm for invasive carcinoma and DCIS 0.5 mm  Bone density scan in September 2016 showed osteopenia with a T score of -2.1 at the AP spine.  10-year probability of a major osteoporotic fracture was 16% and major hip fracture was 2% back then  oncotype testing was not recommended for a 27m grade 1 tumor and hence she did not require adjuvant chemotherapy. She has seen Rad Onc and will be starting adjuvant RT soon   Interval history- patient will be starting her radiation next week. Doing well. Denies any complaints  ECOG PS- 0 Pain scale- 0   Review of systems- Review of Systems  Constitutional: Negative for chills, fever, malaise/fatigue and weight loss.  HENT: Negative for congestion, ear discharge and nosebleeds.   Eyes: Negative for blurred vision.  Respiratory: Negative for cough, hemoptysis, sputum production, shortness of breath and wheezing.   Cardiovascular: Negative for chest pain, palpitations, orthopnea and claudication.  Gastrointestinal: Negative for abdominal pain, blood in stool, constipation, diarrhea, heartburn, melena, nausea and vomiting.  Genitourinary: Negative for dysuria, flank pain, frequency, hematuria and urgency.  Musculoskeletal: Negative for back pain, joint pain and myalgias.  Skin: Negative for rash.  Neurological: Negative for dizziness, tingling, focal weakness, seizures, weakness and headaches.  Endo/Heme/Allergies: Does not bruise/bleed easily.  Psychiatric/Behavioral: Negative for depression and suicidal ideas. The patient does not have insomnia.      Allergies  Allergen Reactions  . Amoxicillin Hives, Itching and Other (See Comments)    Has patient had a PCN reaction causing immediate rash, facial/tongue/throat swelling, SOB or lightheadedness with hypotension: No  Has patient had a PCN reaction causing severe rash involving mucus membranes or skin necrosis:  No Has patient had a PCN reaction that required hospitalization: No Has patient had a PCN reaction occurring within the last 10 years: No If all of the above answers are "NO", then may proceed with Cephalosporin use.   . Erythromycin Hives and Itching  . Sulfa Antibiotics Hives and Itching  . Iodinated Diagnostic Agents Rash    Red rash and itching. Topical iodine not a problem.  . Morphine Nausea And Vomiting and Other (See Comments)    Pretty severe vomiting. Can take hydrocodone and oxycodone       Past Medical History:  Diagnosis Date  . Anemia   . Ankle sprain 03/2017  . Arthritis   . Breast cancer ([place]) 2018  . Breast cancer ([place]) 03/2017  . Depression   . Fracture of distal end of radius 03/2017   left  . Genetic testing 05/04/2017   Multi-Cancer panel (83 genes) @ [facility] - No pathogenic mutations detected  . GERD (gastroesophageal reflux disease)   . Headache   . Hyperlipidemia   . Hypothyroidism   . Pre-diabetes 03/2017   follows healthy diabetic diet  . Sleep apnea    has not used cpap in a long time  . Vitamin B 12 deficiency      Past Surgical History:  Procedure Laterality Date  . ABDOMINAL HYSTERECTOMY  1983  . APPENDECTOMY  1977   when she had c-section  . BREAST BIOPSY Left    INVASIVE MAMMARY CARCINOMA Grade 1  . BREAST LUMPECTOMY Left 04/13/2017   INVASIVE MAMMARY CARCINOMA   . CESAREAN SECTION    . COLONOSCOPY    . ESOPHAGOGASTRODUODENOSCOPY (EGD) WITH PROPOFOL N/A 02/05/2015   Procedure: ESOPHAGOGASTRODUODENOSCOPY (EGD) WITH PROPOFOL;  Surgeon: Josefine Class, MD;  Location[facility] END[facility]COPY;  Service: Endoscopy;  Laterality: N/A;  . PARTIAL MASTECTOMY WITH NEEDLE LOCALIZATION Left 04/13/2017   Procedure: PARTIAL MASTECTOMY WITH NEEDLE LOCALIZATION;  Surgeon: Leonie Green, MD;  Location: [facility] ORS;  Service: General;  Laterality: Left;  . PATELLA FRACTURE SURGERY Left 1991   screws in place  . SAVORY DILATION N/A 02/05/2015    Procedure: SAVORY DILATION;  Surgeon: Josefine Class, MD;  Location: [facility] ENDOSCOPY;  Service: Endoscopy;  Laterality: N/A;  . SENTINEL NODE BIOPSY Left 04/13/2017   Procedure: SENTINEL NODE BIOPSY;  Surgeon: Leonie Green, MD;  Location: [facility] ORS;  Service: General;  Laterality: Left;  . SINUS SURGERY WITH INSTATRAK      Social History   Socioeconomic History  . Marital status: Married    Spouse name: Not on file  . Number of children: Not on file  . Years of education: Not on file  . Highest education level: Not on file  Social Needs  . Financial resource strain: Not on file  . Food insecurity - worry: Not on file  . Food insecurity - inability: Not on file  . Transportation needs - medical: Not on file  . Transportation needs - non-medical: Not on file  Occupational History  . Not on file  Tobacco Use  . Smoking status: Never Smoker  . Smokeless tobacco: Never Used  Substance and Sexual Activity  . Alcohol use: No    Frequency: Never  . Drug use: No  . Sexual activity: Not on file  Other Topics Concern  . Not on file  Social History Narrative  . Not on file    Family History  Problem Relation Age of Onset  . Breast cancer Maternal Aunt 70       deceased 54s  . Breast cancer Cousin 38       daughter of mat aunt with breast cancer  . Breast cancer Cousin 51       daughter of mat aunt with breast cancer  . Deep vein thrombosis Mother   . Heart disease Mother   . Stroke Mother   . Heart disease Father   . Parkinson's disease Father   . Heart disease Sister   . Stroke Sister   . Skin cancer Brother   . Heart disease Brother   . Alzheimer's disease Brother   . Lung cancer Paternal Uncle        deceased 69s  . Heart disease Brother   . Alzheimer's disease Brother   . Heart disease Brother   . Alzheimer's disease Brother   . Cancer Brother        unclear primary; currently 38s  . Cirrhosis Sister   . Alzheimer's disease Sister   . Kidney cancer  Son 89       nephrectomy @ [facility]     Current Outpatient Medications:  .  acetaminophen (TYLENOL) 500 MG tablet, Take 1,000 mg by mouth every 6 (six) hours as needed for moderate pain or headache., Disp: , Rfl:  .  amphetamine-dextroamphetamine (ADDERALL XR) 10 MG 24 hr capsule, Take 10 mg by mouth daily., Disp: , Rfl:  .  atorvastatin (LIPITOR) 40 MG tablet, Take 40 mg by mouth daily. , Disp: , Rfl:  .  darifenacin (ENABLEX) 15 MG 24 hr tablet, Take 15 mg by mouth daily., Disp: , Rfl:  .  esomeprazole (NEXIUM) 40 MG capsule, Take 40 mg by mouth daily. , Disp: , Rfl:  .  Estradiol 10 MCG TABS vaginal tablet, Place vaginally., Disp: , Rfl:  .  fluticasone (FLONASE) 50 MCG/ACT nasal spray, Place 2 sprays into both nostrils daily as needed for allergies. , Disp: , Rfl:  .  levothyroxine (SYNTHROID, LEVOTHROID) 150 MCG tablet, Take 150 mcg by mouth daily before breakfast., Disp: , Rfl:  .  LORazepam (ATIVAN) 0.5 MG tablet, Take 0.5 mg by mouth 2 (two) times daily. May take up to three times a day if needed., Disp: , Rfl:  .  montelukast (SINGULAIR) 10 MG tablet, Take 10 mg by mouth daily. , Disp: , Rfl:  .  ranitidine (ZANTAC) 300 MG capsule, Take 300 mg by mouth at bedtime. , Disp: , Rfl:  .  traZODone (DESYREL) 100 MG tablet, Take 100 mg by mouth at bedtime., Disp: , Rfl:  .  vitamin B-12 (CYANOCOBALAMIN) 1000 MCG tablet, Take 1,000 mcg by mouth daily., Disp: , Rfl:  .  Vitamin D, Ergocalciferol, (DRISDOL) 50000 units CAPS capsule, Take 50,000 Units by mouth every 7 (seven) days., Disp: , Rfl:  .  Vortioxetine HBr (TRINTELLIX) 20 MG TABS, Take 20 mg by mouth daily., Disp: , Rfl:   Physical exam:  Vitals:   05/21/17 0800  BP: 117/80  Pulse: 85  Resp: 18  Temp: 98.5 F (36.9 C)  TempSrc: Tympanic  Weight: 173 lb (78.5 kg)   Physical Exam  Constitutional: She is oriented to person, place, and time and well-developed, well-nourished, and in no distress.  HENT:  Head: Normocephalic and  atraumatic.  Eyes: EOM are normal. Pupils are equal, round, and reactive to light.  Neck: Normal range of motion.  Cardiovascular: Normal rate, regular rhythm and normal heart  sounds.  Pulmonary/Chest: Effort normal and breath sounds normal.  Abdominal: Soft. Bowel sounds are normal.  Musculoskeletal:  Splint in place over left forearm  Neurological: She is alert and oriented to person, place, and time.  Skin: Skin is warm and dry.     CMP Latest Ref Rng & Units 04/12/2017  Glucose 65 - 99 mg/dL 98  BUN 6 - 20 mg/dL 19  Creatinine 0.44 - 1.00 mg/dL 0.65  Sodium 135 - 145 mmol/L 137  Potassium 3.5 - 5.1 mmol/L 4.0  Chloride 101 - 111 mmol/L 102  CO2 22 - 32 mmol/L 25  Calcium 8.9 - 10.3 mg/dL 9.3   CBC Latest Ref Rng & Units 04/12/2017  WBC 3.6 - 11.0 K/uL 9.4  Hemoglobin 12.0 - 16.0 g/dL 13.8  Hematocrit 35.0 - 47.0 % 41.2  Platelets 150 - 440 K/uL 239    No images are attached to the encounter.  Dg Bone Density  Result Date: 05/17/2017 EXAM: DUAL X-RAY ABSORPTIOMETRY (DXA) FOR BONE MINERAL DENSITY IMPRESSION: Dear Dr Janese Banks, Your patient Aleyna Cueva completed a BMD test on 05/17/2017 using the [place] (analysis version: 14.10) manufactured by [employer]. The following summarizes the results of our evaluation. PATIENT BIOGRAPHICAL: Name: Charlotte, Fidalgo Patient ID: [id number] Birth Date: [birth year] Height: 67.5 in. Gender: Female Exam Date: 05/17/2017 Weight: 175.4 lbs. Indications: Advanced Age, Breast CA, Caucasian, History of Breast Cancer, History of Osteoporosis, Hypothyroid, Hysterectomy, Postmenopausal, History of Fracture (Adult) Fractures: Clavicle, Left knee, Left wrist Treatments: Flonase, Singulair, Synthroid, Vitamin D ASSESSMENT: The BMD measured at Femur Neck Left is 0.776 g/cm2 with a T-score of -1.9. This patient is considered osteopenic according to [facility]) criteria. Site Region Measured Measured WHO Young Adult BMD Date        Age      Classification T-score AP Spine L1-L4 05/17/2017 66.3 Osteopenia -1.8 0.968 g/cm2 DualFemur Neck Left 05/17/2017 66.3 Osteopenia -1.9 0.776 g/cm2 World Health Organization [facility]) criteria for post-menopausal, Caucasian Women: Normal:       T-score at or above -1 SD Osteopenia:   T-score between -1 and -2.5 SD Osteoporosis: T-score at or below -2.5 SD RECOMMENDATIONS: [place] recommends that FDA-approved medical therapies be considered in postmenopausal women and men age 5 or older with a: 1. Hip or vertebral (clinical or morphometric) fracture. 2. T-score of < -2.5 at the spine or hip. 3. Ten-year fracture probability by FRAX of 3% or greater for hip fracture or 20% or greater for major osteoporotic fracture. All treatment decisions require clinical judgment and consideration of individual patient factors, including patient preferences, co-morbidities, previous drug use, risk factors not captured in the FRAX model (e.g. falls, vitamin D deficiency, increased bone turnover, interval significant decline in bone density) and possible under - or over-estimation of fracture risk by FRAX. All patients should ensure an adequate intake of dietary calcium (1200 mg/d) and vitamin D (800 IU daily) unless contraindicated. FOLLOW-UP: People with diagnosed cases of osteoporosis or at high risk for fracture should have regular bone mineral density tests. For patients eligible for Medicare, routine testing is allowed once every 2 years. The testing frequency can be increased to one year for patients who have rapidly progressing disease, those who are receiving or discontinuing medical therapy to restore bone mass, or have additional risk factors. I have reviewed this report, and agree with the above findings. [facility] Dear Dr Janese Banks, Your patient Lenon Ahmadi completed a FRAX assessment on 05/17/2017 using  the [employer] DXA System (analysis version: 14.10) manufactured by [employer]. The following summarizes the results of our evaluation. PATIENT BIOGRAPHICAL: Name: Karyl, Sharrar Patient ID: [id number] Birth Date: 09-29-1950 Height:    67.5 in. Gender:     Female    Age:        2.3       Weight:    175.4 lbs. Ethnicity:  White                            Exam Date: 05/17/2017 FRAX* RESULTS:  (version: 3.5) 10-year Probability of Fracture1 Major Osteoporotic Fracture2 Hip Fracture 17.2% 2.6% Population: [place] (Caucasian) Risk Factors: History of Fracture (Adult) Based on Femur (Left) Neck BMD 1 -The 10-year probability of fracture may be lower than reported if the patient has received treatment. 2 -Major Osteoporotic Fracture: Clinical Spine, Forearm, Hip or Shoulder *FRAX is a Materials engineer of the [employer] for Metabolic Bone Disease, a [place] (WHO) [facility]. ASSESSMENT: The probability of a major osteoporotic fracture is 17.2% within the next ten years. The probability of a hip fracture is 2.6 %within the next ten years. . Electronically Signed   By: Marijo Conception, M.D.   On: 05/17/2017 14:02     Assessment and plan- Patient is a 67 y.o. female with pathologically prognostic stage Ia invasive mammary carcinoma of the right breast p T1b p N0 c M0 ER PR positive HER-2/neu negative status post lumpectomy  With regards to patient's breast cancer she does not require adjuvant chemotherapy.  She is already met with radiation oncology and will be starting her adjuvant radiation soon.    Given that patient's tumor iIs ER PR positive-I would recommend adjuvant hormone therapy with Arimidex 1 mg p.o. daily for 5 years upon completion of radiation treatment.  Discussed risks and benefits of Arimidex including all but not limited to fatigue, hot flashes, arthralgias, hypercholesterolemia, worsening bone health.  Written information about Arimidex has been given to the patient.  We will prescribe the Arimidex when she  is closer to completing her radiation treatment.  She is to also take calcium 1200 mg along with 800 international units of vitamin D.  Patient does have osteopenia and plan is discussed below   I discussed the results of her bone density scan which revealed that her 10-year probability of a major osteoporotic risk fracture was up to 17.6% from a prior 16% and that of major hip fracture was increased to 2.5% as compared to 2% from prior.  Given that patient has significant osteopenia I would recommend doing Zometa 5 mg every 18 months for 6 years as per NEJM trial published by Teddy Spike al. I will wait to start this treatment until her next visit and after she obtains dental clearance from Dr. Ronita Hipps given the risk of osteonecrosis of the jaw.  I will tentatively see the patient in 3 months time after she has been on Arimidex for about 6 weeks to assess tolerance.  I will check a CBC and CMP at that time     Visit Diagnosis 1. Malignant neoplasm of upper-outer quadrant of left breast in female, estrogen receptor positive ([place])   2. Osteopenia, unspecified location      Dr. Randa Evens, MD, MPH [facility] Pager- [phone] 05/18/2017 4:19 PM

## 2017-05-21 ENCOUNTER — Encounter: Payer: Self-pay | Admitting: Oncology

## 2017-05-22 DIAGNOSIS — Z510 Encounter for antineoplastic radiation therapy: Secondary | ICD-10-CM | POA: Diagnosis not present

## 2017-05-23 ENCOUNTER — Ambulatory Visit
Admission: RE | Admit: 2017-05-23 | Discharge: 2017-05-23 | Disposition: A | Discharge status: Home / Self Care | Payer: Medicare Other | Source: Ambulatory Visit | Attending: Radiation Oncology | Admitting: Radiation Oncology

## 2017-05-23 DIAGNOSIS — Z510 Encounter for antineoplastic radiation therapy: Secondary | ICD-10-CM | POA: Diagnosis not present

## 2017-05-24 ENCOUNTER — Ambulatory Visit
Admission: RE | Admit: 2017-05-24 | Discharge: 2017-05-24 | Disposition: A | Discharge status: Home / Self Care | Payer: Medicare Other | Source: Ambulatory Visit | Attending: Radiation Oncology | Admitting: Radiation Oncology

## 2017-05-24 DIAGNOSIS — Z510 Encounter for antineoplastic radiation therapy: Secondary | ICD-10-CM | POA: Diagnosis not present

## 2017-05-25 ENCOUNTER — Ambulatory Visit
Admission: RE | Admit: 2017-05-25 | Discharge: 2017-05-25 | Disposition: A | Discharge status: Home / Self Care | Payer: Medicare Other | Source: Ambulatory Visit | Attending: Radiation Oncology | Admitting: Radiation Oncology

## 2017-05-25 DIAGNOSIS — Z510 Encounter for antineoplastic radiation therapy: Secondary | ICD-10-CM | POA: Diagnosis not present

## 2017-05-28 ENCOUNTER — Ambulatory Visit
Admission: RE | Admit: 2017-05-28 | Discharge: 2017-05-28 | Disposition: A | Discharge status: Home / Self Care | Payer: Medicare Other | Source: Ambulatory Visit | Attending: Radiation Oncology | Admitting: Radiation Oncology

## 2017-05-28 DIAGNOSIS — Z510 Encounter for antineoplastic radiation therapy: Secondary | ICD-10-CM | POA: Diagnosis not present

## 2017-05-29 ENCOUNTER — Ambulatory Visit
Admission: RE | Admit: 2017-05-29 | Discharge: 2017-05-29 | Disposition: A | Discharge status: Home / Self Care | Payer: Medicare Other | Source: Ambulatory Visit | Attending: Radiation Oncology | Admitting: Radiation Oncology

## 2017-05-29 DIAGNOSIS — Z510 Encounter for antineoplastic radiation therapy: Secondary | ICD-10-CM | POA: Diagnosis not present

## 2017-05-30 ENCOUNTER — Ambulatory Visit
Admission: RE | Admit: 2017-05-30 | Discharge: 2017-05-30 | Disposition: A | Discharge status: Home / Self Care | Payer: Medicare Other | Source: Ambulatory Visit | Attending: Radiation Oncology | Admitting: Radiation Oncology

## 2017-05-30 DIAGNOSIS — Z510 Encounter for antineoplastic radiation therapy: Secondary | ICD-10-CM | POA: Diagnosis not present

## 2017-05-31 ENCOUNTER — Ambulatory Visit: Payer: Medicare Other

## 2017-06-01 ENCOUNTER — Ambulatory Visit
Admission: RE | Admit: 2017-06-01 | Discharge: 2017-06-01 | Disposition: A | Discharge status: Home / Self Care | Payer: Medicare Other | Source: Ambulatory Visit | Attending: Radiation Oncology | Admitting: Radiation Oncology

## 2017-06-01 DIAGNOSIS — Z510 Encounter for antineoplastic radiation therapy: Secondary | ICD-10-CM | POA: Diagnosis not present

## 2017-06-04 ENCOUNTER — Ambulatory Visit
Admission: RE | Admit: 2017-06-04 | Discharge: 2017-06-04 | Disposition: A | Discharge status: Home / Self Care | Payer: Medicare Other | Source: Ambulatory Visit | Attending: Radiation Oncology | Admitting: Radiation Oncology

## 2017-06-04 DIAGNOSIS — Z510 Encounter for antineoplastic radiation therapy: Secondary | ICD-10-CM | POA: Diagnosis not present

## 2017-06-05 ENCOUNTER — Ambulatory Visit: Payer: Medicare Other

## 2017-06-06 ENCOUNTER — Ambulatory Visit
Admission: RE | Admit: 2017-06-06 | Discharge: 2017-06-06 | Disposition: A | Discharge status: Home / Self Care | Payer: Medicare Other | Source: Ambulatory Visit | Attending: Radiation Oncology | Admitting: Radiation Oncology

## 2017-06-06 DIAGNOSIS — Z510 Encounter for antineoplastic radiation therapy: Secondary | ICD-10-CM | POA: Diagnosis not present

## 2017-06-07 ENCOUNTER — Inpatient Hospital Stay: Payer: Medicare Other | Attending: Radiation Oncology

## 2017-06-07 ENCOUNTER — Ambulatory Visit: Payer: Medicare Other

## 2017-06-07 ENCOUNTER — Ambulatory Visit
Admission: RE | Admit: 2017-06-07 | Discharge: 2017-06-07 | Disposition: A | Discharge status: Home / Self Care | Payer: Medicare Other | Source: Ambulatory Visit | Attending: Radiation Oncology | Admitting: Radiation Oncology

## 2017-06-07 DIAGNOSIS — C50212 Malignant neoplasm of upper-inner quadrant of left female breast: Secondary | ICD-10-CM

## 2017-06-07 DIAGNOSIS — Z79811 Long term (current) use of aromatase inhibitors: Secondary | ICD-10-CM | POA: Diagnosis not present

## 2017-06-07 DIAGNOSIS — M8580 Other specified disorders of bone density and structure, unspecified site: Secondary | ICD-10-CM | POA: Diagnosis not present

## 2017-06-07 DIAGNOSIS — Z170 Estrogen receptor positive status [ER+]: Secondary | ICD-10-CM | POA: Insufficient documentation

## 2017-06-07 DIAGNOSIS — C50412 Malignant neoplasm of upper-outer quadrant of left female breast: Secondary | ICD-10-CM | POA: Insufficient documentation

## 2017-06-07 DIAGNOSIS — Z510 Encounter for antineoplastic radiation therapy: Secondary | ICD-10-CM | POA: Diagnosis not present

## 2017-06-07 LAB — CBC
HCT: 42.2 % (ref 35.0–47.0)
HEMOGLOBIN: 13.9 g/dL (ref 12.0–16.0)
MCH: 28.6 pg (ref 26.0–34.0)
MCHC: 33 g/dL (ref 32.0–36.0)
MCV: 86.6 fL (ref 80.0–100.0)
Platelets: 253 10*3/uL (ref 150–440)
RBC: 4.87 MIL/uL (ref 3.80–5.20)
RDW: 13.5 % (ref 11.5–14.5)
WBC: 8 10*3/uL (ref 3.6–11.0)

## 2017-06-08 ENCOUNTER — Ambulatory Visit: Payer: Medicare Other

## 2017-06-11 ENCOUNTER — Ambulatory Visit: Payer: Medicare Other

## 2017-06-11 ENCOUNTER — Ambulatory Visit
Admission: RE | Admit: 2017-06-11 | Discharge: 2017-06-11 | Disposition: A | Discharge status: Home / Self Care | Payer: Medicare Other | Source: Ambulatory Visit | Attending: Radiation Oncology | Admitting: Radiation Oncology

## 2017-06-11 DIAGNOSIS — Z510 Encounter for antineoplastic radiation therapy: Secondary | ICD-10-CM | POA: Diagnosis not present

## 2017-06-12 ENCOUNTER — Ambulatory Visit
Admission: RE | Admit: 2017-06-12 | Discharge: 2017-06-12 | Disposition: A | Discharge status: Home / Self Care | Payer: Medicare Other | Source: Ambulatory Visit | Attending: Radiation Oncology | Admitting: Radiation Oncology

## 2017-06-12 DIAGNOSIS — Z510 Encounter for antineoplastic radiation therapy: Secondary | ICD-10-CM | POA: Diagnosis not present

## 2017-06-13 ENCOUNTER — Ambulatory Visit
Admission: RE | Admit: 2017-06-13 | Discharge: 2017-06-13 | Disposition: A | Discharge status: Home / Self Care | Payer: Medicare Other | Source: Ambulatory Visit | Attending: Radiation Oncology | Admitting: Radiation Oncology

## 2017-06-13 DIAGNOSIS — Z510 Encounter for antineoplastic radiation therapy: Secondary | ICD-10-CM | POA: Diagnosis not present

## 2017-06-14 ENCOUNTER — Ambulatory Visit: Payer: Medicare Other

## 2017-06-14 ENCOUNTER — Ambulatory Visit
Admission: RE | Admit: 2017-06-14 | Discharge: 2017-06-14 | Disposition: A | Discharge status: Home / Self Care | Payer: Medicare Other | Source: Ambulatory Visit | Attending: Radiation Oncology | Admitting: Radiation Oncology

## 2017-06-14 DIAGNOSIS — Z510 Encounter for antineoplastic radiation therapy: Secondary | ICD-10-CM | POA: Diagnosis not present

## 2017-06-15 ENCOUNTER — Ambulatory Visit: Payer: Medicare Other

## 2017-06-18 ENCOUNTER — Ambulatory Visit
Admission: RE | Admit: 2017-06-18 | Discharge: 2017-06-18 | Disposition: A | Discharge status: Home / Self Care | Payer: Medicare Other | Source: Ambulatory Visit | Attending: Radiation Oncology | Admitting: Radiation Oncology

## 2017-06-18 DIAGNOSIS — Z510 Encounter for antineoplastic radiation therapy: Secondary | ICD-10-CM | POA: Diagnosis not present

## 2017-06-19 ENCOUNTER — Ambulatory Visit
Admission: RE | Admit: 2017-06-19 | Discharge: 2017-06-19 | Disposition: A | Discharge status: Home / Self Care | Payer: Medicare Other | Source: Ambulatory Visit | Attending: Radiation Oncology | Admitting: Radiation Oncology

## 2017-06-19 DIAGNOSIS — Z510 Encounter for antineoplastic radiation therapy: Secondary | ICD-10-CM | POA: Diagnosis not present

## 2017-06-20 ENCOUNTER — Ambulatory Visit
Admission: RE | Admit: 2017-06-20 | Discharge: 2017-06-20 | Disposition: A | Discharge status: Home / Self Care | Payer: Medicare Other | Source: Ambulatory Visit | Attending: Radiation Oncology | Admitting: Radiation Oncology

## 2017-06-20 DIAGNOSIS — Z510 Encounter for antineoplastic radiation therapy: Secondary | ICD-10-CM | POA: Diagnosis not present

## 2017-06-21 ENCOUNTER — Ambulatory Visit
Admission: RE | Admit: 2017-06-21 | Discharge: 2017-06-21 | Disposition: A | Discharge status: Home / Self Care | Payer: Medicare Other | Source: Ambulatory Visit | Attending: Radiation Oncology | Admitting: Radiation Oncology

## 2017-06-21 ENCOUNTER — Inpatient Hospital Stay: Payer: Medicare Other

## 2017-06-21 DIAGNOSIS — C50412 Malignant neoplasm of upper-outer quadrant of left female breast: Secondary | ICD-10-CM | POA: Diagnosis not present

## 2017-06-21 DIAGNOSIS — Z510 Encounter for antineoplastic radiation therapy: Secondary | ICD-10-CM | POA: Diagnosis not present

## 2017-06-21 DIAGNOSIS — C50212 Malignant neoplasm of upper-inner quadrant of left female breast: Secondary | ICD-10-CM

## 2017-06-21 LAB — CBC
HCT: 39.1 % (ref 35.0–47.0)
HEMOGLOBIN: 13.1 g/dL (ref 12.0–16.0)
MCH: 28.7 pg (ref 26.0–34.0)
MCHC: 33.5 g/dL (ref 32.0–36.0)
MCV: 85.7 fL (ref 80.0–100.0)
Platelets: 210 10*3/uL (ref 150–440)
RBC: 4.57 MIL/uL (ref 3.80–5.20)
RDW: 13.4 % (ref 11.5–14.5)
WBC: 8 10*3/uL (ref 3.6–11.0)

## 2017-06-22 ENCOUNTER — Ambulatory Visit: Payer: Medicare Other

## 2017-06-22 ENCOUNTER — Ambulatory Visit
Admission: RE | Admit: 2017-06-22 | Discharge: 2017-06-22 | Disposition: A | Discharge status: Home / Self Care | Payer: Medicare Other | Source: Ambulatory Visit | Attending: Radiation Oncology | Admitting: Radiation Oncology

## 2017-06-22 DIAGNOSIS — Z510 Encounter for antineoplastic radiation therapy: Secondary | ICD-10-CM | POA: Diagnosis not present

## 2017-06-25 ENCOUNTER — Ambulatory Visit
Admission: RE | Admit: 2017-06-25 | Discharge: 2017-06-25 | Disposition: A | Discharge status: Home / Self Care | Payer: Medicare Other | Source: Ambulatory Visit | Attending: Radiation Oncology | Admitting: Radiation Oncology

## 2017-06-25 DIAGNOSIS — Z510 Encounter for antineoplastic radiation therapy: Secondary | ICD-10-CM | POA: Diagnosis not present

## 2017-06-26 ENCOUNTER — Ambulatory Visit
Admission: RE | Admit: 2017-06-26 | Discharge: 2017-06-26 | Disposition: A | Discharge status: Home / Self Care | Payer: Medicare Other | Source: Ambulatory Visit | Attending: Radiation Oncology | Admitting: Radiation Oncology

## 2017-06-26 DIAGNOSIS — Z510 Encounter for antineoplastic radiation therapy: Secondary | ICD-10-CM | POA: Diagnosis not present

## 2017-06-27 ENCOUNTER — Ambulatory Visit
Admission: RE | Admit: 2017-06-27 | Discharge: 2017-06-27 | Disposition: A | Discharge status: Home / Self Care | Payer: Medicare Other | Source: Ambulatory Visit | Attending: Radiation Oncology | Admitting: Radiation Oncology

## 2017-06-27 DIAGNOSIS — Z510 Encounter for antineoplastic radiation therapy: Secondary | ICD-10-CM | POA: Diagnosis not present

## 2017-06-28 ENCOUNTER — Ambulatory Visit
Admission: RE | Admit: 2017-06-28 | Discharge: 2017-06-28 | Disposition: A | Discharge status: Home / Self Care | Payer: Medicare Other | Source: Ambulatory Visit | Attending: Radiation Oncology | Admitting: Radiation Oncology

## 2017-06-28 DIAGNOSIS — Z510 Encounter for antineoplastic radiation therapy: Secondary | ICD-10-CM | POA: Diagnosis not present

## 2017-06-29 ENCOUNTER — Ambulatory Visit
Admission: RE | Admit: 2017-06-29 | Discharge: 2017-06-29 | Disposition: A | Discharge status: Home / Self Care | Payer: Medicare Other | Source: Ambulatory Visit | Attending: Radiation Oncology | Admitting: Radiation Oncology

## 2017-06-29 DIAGNOSIS — Z510 Encounter for antineoplastic radiation therapy: Secondary | ICD-10-CM | POA: Insufficient documentation

## 2017-06-29 DIAGNOSIS — Z170 Estrogen receptor positive status [ER+]: Secondary | ICD-10-CM | POA: Insufficient documentation

## 2017-06-29 DIAGNOSIS — C50412 Malignant neoplasm of upper-outer quadrant of left female breast: Secondary | ICD-10-CM | POA: Insufficient documentation

## 2017-07-02 ENCOUNTER — Ambulatory Visit
Admission: RE | Admit: 2017-07-02 | Discharge: 2017-07-02 | Disposition: A | Discharge status: Home / Self Care | Payer: Medicare Other | Source: Ambulatory Visit | Attending: Radiation Oncology | Admitting: Radiation Oncology

## 2017-07-02 DIAGNOSIS — Z510 Encounter for antineoplastic radiation therapy: Secondary | ICD-10-CM | POA: Diagnosis not present

## 2017-07-02 DIAGNOSIS — Z170 Estrogen receptor positive status [ER+]: Secondary | ICD-10-CM | POA: Diagnosis not present

## 2017-07-02 DIAGNOSIS — C50412 Malignant neoplasm of upper-outer quadrant of left female breast: Secondary | ICD-10-CM | POA: Diagnosis not present

## 2017-07-03 ENCOUNTER — Ambulatory Visit
Admission: RE | Admit: 2017-07-03 | Discharge: 2017-07-03 | Disposition: A | Discharge status: Home / Self Care | Payer: Medicare Other | Source: Ambulatory Visit | Attending: Radiation Oncology | Admitting: Radiation Oncology

## 2017-07-03 DIAGNOSIS — Z510 Encounter for antineoplastic radiation therapy: Secondary | ICD-10-CM | POA: Diagnosis not present

## 2017-07-04 ENCOUNTER — Ambulatory Visit
Admission: RE | Admit: 2017-07-04 | Discharge: 2017-07-04 | Disposition: A | Discharge status: Home / Self Care | Payer: Medicare Other | Source: Ambulatory Visit | Attending: Radiation Oncology | Admitting: Radiation Oncology

## 2017-07-04 ENCOUNTER — Ambulatory Visit: Payer: Medicare Other

## 2017-07-04 DIAGNOSIS — Z510 Encounter for antineoplastic radiation therapy: Secondary | ICD-10-CM | POA: Diagnosis not present

## 2017-07-05 ENCOUNTER — Ambulatory Visit: Payer: Medicare Other

## 2017-07-05 ENCOUNTER — Inpatient Hospital Stay: Payer: Medicare Other | Attending: Radiation Oncology

## 2017-07-05 DIAGNOSIS — Z170 Estrogen receptor positive status [ER+]: Secondary | ICD-10-CM | POA: Insufficient documentation

## 2017-07-05 DIAGNOSIS — Z79811 Long term (current) use of aromatase inhibitors: Secondary | ICD-10-CM | POA: Insufficient documentation

## 2017-07-05 DIAGNOSIS — C50412 Malignant neoplasm of upper-outer quadrant of left female breast: Secondary | ICD-10-CM | POA: Insufficient documentation

## 2017-07-06 ENCOUNTER — Inpatient Hospital Stay: Payer: Medicare Other

## 2017-07-06 ENCOUNTER — Ambulatory Visit
Admission: RE | Admit: 2017-07-06 | Discharge: 2017-07-06 | Disposition: A | Discharge status: Home / Self Care | Payer: Medicare Other | Source: Ambulatory Visit | Attending: Radiation Oncology | Admitting: Radiation Oncology

## 2017-07-06 ENCOUNTER — Ambulatory Visit: Payer: Medicare Other

## 2017-07-06 DIAGNOSIS — C50412 Malignant neoplasm of upper-outer quadrant of left female breast: Secondary | ICD-10-CM | POA: Diagnosis present

## 2017-07-06 DIAGNOSIS — Z510 Encounter for antineoplastic radiation therapy: Secondary | ICD-10-CM | POA: Diagnosis not present

## 2017-07-06 DIAGNOSIS — C50212 Malignant neoplasm of upper-inner quadrant of left female breast: Secondary | ICD-10-CM

## 2017-07-06 DIAGNOSIS — Z170 Estrogen receptor positive status [ER+]: Secondary | ICD-10-CM | POA: Diagnosis not present

## 2017-07-06 DIAGNOSIS — Z79811 Long term (current) use of aromatase inhibitors: Secondary | ICD-10-CM | POA: Diagnosis not present

## 2017-07-06 LAB — CBC
HCT: 40.4 % (ref 35.0–47.0)
Hemoglobin: 13.7 g/dL (ref 12.0–16.0)
MCH: 28.9 pg (ref 26.0–34.0)
MCHC: 33.8 g/dL (ref 32.0–36.0)
MCV: 85.5 fL (ref 80.0–100.0)
Platelets: 236 10*3/uL (ref 150–440)
RBC: 4.73 MIL/uL (ref 3.80–5.20)
RDW: 13.8 % (ref 11.5–14.5)
WBC: 7.2 10*3/uL (ref 3.6–11.0)

## 2017-07-09 ENCOUNTER — Ambulatory Visit: Payer: Medicare Other

## 2017-07-09 ENCOUNTER — Ambulatory Visit
Admission: RE | Admit: 2017-07-09 | Discharge: 2017-07-09 | Disposition: A | Discharge status: Home / Self Care | Payer: Medicare Other | Source: Ambulatory Visit | Attending: Radiation Oncology | Admitting: Radiation Oncology

## 2017-07-09 DIAGNOSIS — Z510 Encounter for antineoplastic radiation therapy: Secondary | ICD-10-CM | POA: Diagnosis not present

## 2017-07-10 ENCOUNTER — Ambulatory Visit
Admission: RE | Admit: 2017-07-10 | Discharge: 2017-07-10 | Disposition: A | Discharge status: Home / Self Care | Payer: Medicare Other | Source: Ambulatory Visit | Attending: Radiation Oncology | Admitting: Radiation Oncology

## 2017-07-10 DIAGNOSIS — Z510 Encounter for antineoplastic radiation therapy: Secondary | ICD-10-CM | POA: Diagnosis not present

## 2017-07-11 ENCOUNTER — Ambulatory Visit
Admission: RE | Admit: 2017-07-11 | Discharge: 2017-07-11 | Disposition: A | Discharge status: Home / Self Care | Payer: Medicare Other | Source: Ambulatory Visit | Attending: Radiation Oncology | Admitting: Radiation Oncology

## 2017-07-11 DIAGNOSIS — Z510 Encounter for antineoplastic radiation therapy: Secondary | ICD-10-CM | POA: Diagnosis not present

## 2017-07-12 ENCOUNTER — Ambulatory Visit: Payer: Medicare Other

## 2017-07-12 ENCOUNTER — Ambulatory Visit
Admission: RE | Admit: 2017-07-12 | Discharge: 2017-07-12 | Disposition: A | Discharge status: Home / Self Care | Payer: Medicare Other | Source: Ambulatory Visit | Attending: Radiation Oncology | Admitting: Radiation Oncology

## 2017-07-12 DIAGNOSIS — Z510 Encounter for antineoplastic radiation therapy: Secondary | ICD-10-CM | POA: Diagnosis not present

## 2017-07-13 ENCOUNTER — Ambulatory Visit
Admission: RE | Admit: 2017-07-13 | Discharge: 2017-07-13 | Disposition: A | Discharge status: Home / Self Care | Payer: Medicare Other | Source: Ambulatory Visit | Attending: Radiation Oncology | Admitting: Radiation Oncology

## 2017-07-13 ENCOUNTER — Ambulatory Visit: Payer: Medicare Other

## 2017-07-13 DIAGNOSIS — Z510 Encounter for antineoplastic radiation therapy: Secondary | ICD-10-CM | POA: Diagnosis not present

## 2017-07-16 ENCOUNTER — Ambulatory Visit
Admission: RE | Admit: 2017-07-16 | Discharge: 2017-07-16 | Disposition: A | Discharge status: Home / Self Care | Payer: Medicare Other | Source: Ambulatory Visit | Attending: Radiation Oncology | Admitting: Radiation Oncology

## 2017-07-16 DIAGNOSIS — Z510 Encounter for antineoplastic radiation therapy: Secondary | ICD-10-CM | POA: Diagnosis not present

## 2017-07-17 ENCOUNTER — Ambulatory Visit
Admission: RE | Admit: 2017-07-17 | Discharge: 2017-07-17 | Disposition: A | Discharge status: Home / Self Care | Payer: Medicare Other | Source: Ambulatory Visit | Attending: Radiation Oncology | Admitting: Radiation Oncology

## 2017-07-17 DIAGNOSIS — Z510 Encounter for antineoplastic radiation therapy: Secondary | ICD-10-CM | POA: Diagnosis not present

## 2017-07-18 ENCOUNTER — Telehealth: Payer: Self-pay | Admitting: *Deleted

## 2017-07-18 ENCOUNTER — Ambulatory Visit
Admission: RE | Admit: 2017-07-18 | Discharge: 2017-07-18 | Disposition: A | Discharge status: Home / Self Care | Payer: Medicare Other | Source: Ambulatory Visit | Attending: Radiation Oncology | Admitting: Radiation Oncology

## 2017-07-18 DIAGNOSIS — Z510 Encounter for antineoplastic radiation therapy: Secondary | ICD-10-CM | POA: Diagnosis not present

## 2017-07-18 MED ORDER — ANASTROZOLE 1 MG PO TABS: 1.0000 mg | ORAL_TABLET | Freq: Every day | ORAL | 0 refills | Status: DC

## 2017-07-18 NOTE — Telephone Encounter (Signed)
Called pt to talk about her starting AI, she does not need appt with [place] tom but to just start AI on Friday and I have sent it to her pharmacy. She has appt 4/25 to see [place] with labs and that is to make sure she is ok on the new med for her breast cancer. She is agreeable to plan

## 2017-07-18 NOTE — Telephone Encounter (Signed)
-----   Message from Daiva Huge, RN sent at 07/18/2017  1:25 PM EDT ----- Regarding: Last radiation Ms. Bias said she is supposed to let Dr. Janese Banks know her last day of radiation.  She finishes tomorrow 3/21.  Thanks,   [facility]

## 2017-07-19 ENCOUNTER — Ambulatory Visit
Admission: RE | Admit: 2017-07-19 | Discharge: 2017-07-19 | Disposition: A | Discharge status: Home / Self Care | Payer: Medicare Other | Source: Ambulatory Visit | Attending: Radiation Oncology | Admitting: Radiation Oncology

## 2017-07-19 DIAGNOSIS — Z510 Encounter for antineoplastic radiation therapy: Secondary | ICD-10-CM | POA: Diagnosis not present

## 2017-07-24 ENCOUNTER — Encounter: Payer: Self-pay | Admitting: *Deleted

## 2017-08-06 ENCOUNTER — Other Ambulatory Visit: Payer: Self-pay | Admitting: *Deleted

## 2017-08-06 MED ORDER — ANASTROZOLE 1 MG PO TABS: 1.0000 mg | ORAL_TABLET | Freq: Every day | ORAL | 0 refills | Status: DC

## 2017-08-23 ENCOUNTER — Telehealth: Payer: Self-pay | Admitting: *Deleted

## 2017-08-23 ENCOUNTER — Inpatient Hospital Stay (HOSPITAL_BASED_OUTPATIENT_CLINIC_OR_DEPARTMENT_OTHER): Payer: Medicare Other | Admitting: Oncology

## 2017-08-23 ENCOUNTER — Ambulatory Visit
Admission: RE | Admit: 2017-08-23 | Discharge: 2017-08-23 | Disposition: A | Discharge status: Home / Self Care | Payer: Medicare Other | Source: Ambulatory Visit | Attending: Radiation Oncology | Admitting: Radiation Oncology

## 2017-08-23 ENCOUNTER — Inpatient Hospital Stay: Payer: Medicare Other | Attending: Oncology

## 2017-08-23 ENCOUNTER — Other Ambulatory Visit: Payer: Self-pay

## 2017-08-23 ENCOUNTER — Encounter: Payer: Self-pay | Admitting: Oncology

## 2017-08-23 VITALS — BP 122/77 | HR 75 | Temp 97.3°F | Resp 18 | Wt 172.4 lb

## 2017-08-23 DIAGNOSIS — Z90710 Acquired absence of both cervix and uterus: Secondary | ICD-10-CM

## 2017-08-23 DIAGNOSIS — E039 Hypothyroidism, unspecified: Secondary | ICD-10-CM

## 2017-08-23 DIAGNOSIS — R7303 Prediabetes: Secondary | ICD-10-CM | POA: Diagnosis not present

## 2017-08-23 DIAGNOSIS — Z79811 Long term (current) use of aromatase inhibitors: Secondary | ICD-10-CM | POA: Insufficient documentation

## 2017-08-23 DIAGNOSIS — Z803 Family history of malignant neoplasm of breast: Secondary | ICD-10-CM | POA: Diagnosis not present

## 2017-08-23 DIAGNOSIS — K219 Gastro-esophageal reflux disease without esophagitis: Secondary | ICD-10-CM | POA: Diagnosis not present

## 2017-08-23 DIAGNOSIS — G4730 Sleep apnea, unspecified: Secondary | ICD-10-CM | POA: Diagnosis not present

## 2017-08-23 DIAGNOSIS — M8580 Other specified disorders of bone density and structure, unspecified site: Secondary | ICD-10-CM | POA: Diagnosis not present

## 2017-08-23 DIAGNOSIS — C50412 Malignant neoplasm of upper-outer quadrant of left female breast: Secondary | ICD-10-CM | POA: Diagnosis present

## 2017-08-23 DIAGNOSIS — E785 Hyperlipidemia, unspecified: Secondary | ICD-10-CM | POA: Insufficient documentation

## 2017-08-23 DIAGNOSIS — Z170 Estrogen receptor positive status [ER+]: Secondary | ICD-10-CM | POA: Diagnosis not present

## 2017-08-23 DIAGNOSIS — C50912 Malignant neoplasm of unspecified site of left female breast: Secondary | ICD-10-CM | POA: Insufficient documentation

## 2017-08-23 DIAGNOSIS — Z880 Allergy status to penicillin: Secondary | ICD-10-CM | POA: Insufficient documentation

## 2017-08-23 DIAGNOSIS — Z923 Personal history of irradiation: Secondary | ICD-10-CM | POA: Diagnosis not present

## 2017-08-23 DIAGNOSIS — Z8051 Family history of malignant neoplasm of kidney: Secondary | ICD-10-CM | POA: Insufficient documentation

## 2017-08-23 DIAGNOSIS — Z79899 Other long term (current) drug therapy: Secondary | ICD-10-CM | POA: Diagnosis not present

## 2017-08-23 DIAGNOSIS — Z809 Family history of malignant neoplasm, unspecified: Secondary | ICD-10-CM

## 2017-08-23 DIAGNOSIS — Z5181 Encounter for therapeutic drug level monitoring: Secondary | ICD-10-CM | POA: Diagnosis not present

## 2017-08-23 LAB — COMPREHENSIVE METABOLIC PANEL
ALK PHOS: 155 U/L — AB (ref 38–126)
ALT: 27 U/L (ref 14–54)
AST: 27 U/L (ref 15–41)
Albumin: 4.1 g/dL (ref 3.5–5.0)
Anion gap: 9 (ref 5–15)
BILIRUBIN TOTAL: 0.5 mg/dL (ref 0.3–1.2)
BUN: 18 mg/dL (ref 6–20)
CALCIUM: 9.4 mg/dL (ref 8.9–10.3)
CO2: 26 mmol/L (ref 22–32)
CREATININE: 0.73 mg/dL (ref 0.44–1.00)
Chloride: 104 mmol/L (ref 101–111)
GFR calc non Af Amer: >60 mL/min (ref >60)
Glucose, Bld: 125 mg/dL — ABNORMAL HIGH (ref 65–99)
Potassium: 4.3 mmol/L (ref 3.5–5.1)
SODIUM: 139 mmol/L (ref 135–145)
TOTAL PROTEIN: 7.2 g/dL (ref 6.5–8.1)

## 2017-08-23 NOTE — Progress Notes (Signed)
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 Mammogram showed possible asymmetry in the right breast and possible mass and distortion in the left breast. Diagnostic bilateral mammogram showed suspicious mass in the left breast at 10 o'clock position 6 x 4 x 5 mm in size. No evidence of left axillary adenopathy. Asymmetry seen in the right breast resolved on Tomosyntesis images and consistent with overlapping fibroglandular tissue.  Patient underwent ultrasound-guided core biopsy of the left breast lesion which showed invasive mammary carcinoma, grade 1. Greater than 90% ER positive, 1% PR positive and HER-2 equivocal by IHC. FISH testing for HER-2 is currently pending  She is G2 P2 L2. Remote use of hormone contraception. Menarche at the age of 25. She had hysterectomy in her 10s. No prior abnormal breast mammograms or breast biopsies. Family history significant for prostate cancer in her brother. Breast cancer in maternal aunt and 2 maternal first cousins. She does not know if they have been tested for breast cancer  Patient underwent lumpectomy with sentinel lymph node biopsy on 04/13/2017. 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 Margins were negative for invasive and in situ carcinoma. 0 out of 1 sentinel lymph node positive for malignancy.  ER greater than 90% positive PR 1% positive HER-2 FISH negative.Margins were 2 mm for invasive carcinoma and DCIS 0.5 mm  Bone density scan in September 2016 showed osteopenia with a T score of -2.1 at the AP spine. 10-year probability of a major osteoporotic fracture was 16% and major hip fracture was 2% back then  oncotype testing was not recommended for a 10m grade 1 tumor and hence she did not require adjuvant chemotherapy. She has completed adjuavnt RT and started taking arimidex in March 2019  Interval history- reports occasional chest wall discomfort when she lifts heavy objects. Denies any fatigue, arthralgias or hot flashes  ECOG PS- 0 Pain scale- 0   Review of systems- Review of Systems  Constitutional: Negative for chills, fever, malaise/fatigue and weight loss.  HENT: Negative for congestion, ear discharge and nosebleeds.   Eyes: Negative for blurred vision.  Respiratory: Negative for cough, hemoptysis, sputum production, shortness of breath and wheezing.   Cardiovascular: Negative for chest pain, palpitations, orthopnea and claudication.  Gastrointestinal: Negative for abdominal pain, blood in stool, constipation, diarrhea, heartburn, melena, nausea and vomiting.  Genitourinary: Negative for dysuria, flank pain, frequency, hematuria and urgency.  Musculoskeletal: Negative for back pain, joint pain and myalgias.  Skin: Negative for rash.  Neurological: Negative for dizziness, tingling, focal weakness, seizures, weakness and headaches.  Endo/Heme/Allergies: Does not bruise/bleed easily.  Psychiatric/Behavioral: Negative for depression and suicidal ideas. The patient does not have insomnia.       Allergies  Allergen Reactions  . Amoxicillin Hives, Itching and Other (See Comments)    Has patient had a PCN reaction causing immediate rash, facial/tongue/throat swelling, SOB or lightheadedness with hypotension: No Has patient had a PCN  reaction causing severe rash  involving mucus membranes or skin necrosis: No Has patient had a PCN reaction that required hospitalization: No Has patient had a PCN reaction occurring within the last 10 years: No If all of the above answers are "NO", then may proceed with Cephalosporin use.   . Erythromycin Hives and Itching  . Sulfa Antibiotics Hives and Itching  . Iodinated Diagnostic Agents Rash    Red rash and itching. Topical iodine not a problem.  . Morphine Nausea And Vomiting and Other (See Comments)    Pretty severe vomiting. Can take hydrocodone and oxycodone       Past Medical History:  Diagnosis Date  . Anemia   . Ankle sprain 03/2017  . Arthritis   . Breast cancer ([place]) 2018  . Breast cancer ([place]) 03/2017  . Depression   . Fracture of distal end of radius 03/2017   left  . Genetic testing 05/04/2017   Multi-Cancer panel (83 genes) @ [facility] - No pathogenic mutations detected  . GERD (gastroesophageal reflux disease)   . Headache   . Hyperlipidemia   . Hypothyroidism   . Pre-diabetes 03/2017   follows healthy diabetic diet  . Sleep apnea    has not used cpap in a long time  . Vitamin B 12 deficiency      Past Surgical History:  Procedure Laterality Date  . ABDOMINAL HYSTERECTOMY  1983  . APPENDECTOMY  1977   when she had c-section  . BREAST BIOPSY Left    INVASIVE MAMMARY CARCINOMA Grade 1  . BREAST LUMPECTOMY Left 04/13/2017   INVASIVE MAMMARY CARCINOMA   . CESAREAN SECTION    . COLONOSCOPY    . ESOPHAGOGASTRODUODENOSCOPY (EGD) WITH PROPOFOL N/A 02/05/2015   Procedure: ESOPHAGOGASTRODUODENOSCOPY (EGD) WITH PROPOFOL;  Surgeon: Josefine Class, MD;  Location: [facility] [place] ENDOSCOPY;  Service: Endoscopy;  Laterality: N/A;  . PARTIAL MASTECTOMY WITH NEEDLE LOCALIZATION Left 04/13/2017   Procedure: PARTIAL MASTECTOMY WITH NEEDLE LOCALIZATION;  Surgeon: Leonie Green, MD;  Location: [facility] ORS;  Service: General;  Laterality: Left;  . PATELLA FRACTURE SURGERY Left 1991   screws in  place  . SAVORY DILATION N/A 02/05/2015   Procedure: SAVORY DILATION;  Surgeon: Josefine Class, MD;  Location: [facility] ENDOSCOPY;  Service: Endoscopy;  Laterality: N/A;  . SENTINEL NODE BIOPSY Left 04/13/2017   Procedure: SENTINEL NODE BIOPSY;  Surgeon: Leonie Green, MD;  Location: [facility] ORS;  Service: General;  Laterality: Left;  . SINUS SURGERY WITH INSTATRAK      Social History   Socioeconomic History  . Marital status: Married    Spouse name: Not on file  . Number of children: Not on file  . Years of education: Not on file  . Highest education level: Not on file  Occupational History  . Not on file  Social Needs  . Financial resource strain: Not on file  . Food insecurity:    Worry: Not on file    Inability: Not on file  . Transportation needs:    Medical: Not on file    Non-medical: Not on file  Tobacco Use  . Smoking status: Never Smoker  . Smokeless tobacco: Never Used  Substance and Sexual Activity  . Alcohol use: No    Frequency: Never  . Drug use: No  . Sexual activity: Not on file  Lifestyle  . Physical activity:    Days per week: Not on file    Minutes per session: Not on file  . Stress: Not  on file  Relationships  . Social connections:    Talks on phone: Not on file    Gets together: Not on file    Attends religious service: Not on file    Active member of club or organization: Not on file    Attends meetings of clubs or organizations: Not on file    Relationship status: Not on file  . Intimate partner violence:    Fear of current or ex partner: Not on file    Emotionally abused: Not on file    Physically abused: Not on file    Forced sexual activity: Not on file  Other Topics Concern  . Not on file  Social History Narrative  . Not on file    Family History  Problem Relation Age of Onset  . Breast cancer Maternal Aunt 70       deceased 10s  . Breast cancer Cousin 61       daughter of mat aunt with breast cancer  . Breast cancer  Cousin 63       daughter of mat aunt with breast cancer  . Deep vein thrombosis Mother   . Heart disease Mother   . Stroke Mother   . Heart disease Father   . Parkinson's disease Father   . Heart disease Sister   . Stroke Sister   . Skin cancer Brother   . Heart disease Brother   . Alzheimer's disease Brother   . Lung cancer Paternal Uncle        deceased 72s  . Heart disease Brother   . Alzheimer's disease Brother   . Heart disease Brother   . Alzheimer's disease Brother   . Cancer Brother        unclear primary; currently 47s  . Cirrhosis Sister   . Alzheimer's disease Sister   . Kidney cancer Son 72       nephrectomy @ [facility]     Current Outpatient Medications:  .  amphetamine-dextroamphetamine (ADDERALL XR) 10 MG 24 hr capsule, Take 10 mg by mouth daily., Disp: , Rfl:  .  anastrozole (ARIMIDEX) 1 MG tablet, Take 1 tablet (1 mg total) by mouth daily., Disp: 90 tablet, Rfl: 0 .  atorvastatin (LIPITOR) 40 MG tablet, Take 40 mg by mouth daily. , Disp: , Rfl:  .  darifenacin (ENABLEX) 15 MG 24 hr tablet, Take 15 mg by mouth daily., Disp: , Rfl:  .  esomeprazole (NEXIUM) 40 MG capsule, Take 40 mg by mouth daily. , Disp: , Rfl:  .  levothyroxine (SYNTHROID, LEVOTHROID) 150 MCG tablet, Take 150 mcg by mouth daily before breakfast., Disp: , Rfl:  .  LORazepam (ATIVAN) 0.5 MG tablet, Take 0.5 mg by mouth 2 (two) times daily. May take up to three times a day if needed., Disp: , Rfl:  .  montelukast (SINGULAIR) 10 MG tablet, Take 10 mg by mouth daily. , Disp: , Rfl:  .  ranitidine (ZANTAC) 300 MG capsule, Take 300 mg by mouth at bedtime. , Disp: , Rfl:  .  traZODone (DESYREL) 100 MG tablet, Take 100 mg by mouth at bedtime., Disp: , Rfl:  .  vitamin B-12 (CYANOCOBALAMIN) 1000 MCG tablet, Take 1,000 mcg by mouth daily., Disp: , Rfl:  .  Vitamin D, Ergocalciferol, (DRISDOL) 50000 units CAPS capsule, Take 50,000 Units by mouth every 7 (seven) days., Disp: , Rfl:  .  Vortioxetine HBr  (TRINTELLIX) 20 MG TABS, Take 20 mg by mouth daily., Disp: , Rfl:  .  acetaminophen (TYLENOL) 500 MG tablet, Take 1,000 mg by mouth every 6 (six) hours as needed for moderate pain or headache., Disp: , Rfl:  .  Estradiol 10 MCG TABS vaginal tablet, Place vaginally., Disp: , Rfl:  .  fluticasone (FLONASE) 50 MCG/ACT nasal spray, Place 2 sprays into both nostrils daily as needed for allergies. , Disp: , Rfl:   Physical exam:  Vitals:   08/23/17 1037  BP: 122/77  Pulse: 75  Resp: 18  Temp: (!) 97.3 F (36.3 C)  TempSrc: Tympanic  SpO2: 97%  Weight: 172 lb 6.4 oz (78.2 kg)   Physical Exam  Constitutional: She is oriented to person, place, and time. She appears well-developed and well-nourished.  HENT:  Head: Normocephalic and atraumatic.  Eyes: Pupils are equal, round, and reactive to light. EOM are normal.  Neck: Normal range of motion.  Cardiovascular: Normal rate, regular rhythm and normal heart sounds.  Pulmonary/Chest: Effort normal and breath sounds normal.  Abdominal: Soft. Bowel sounds are normal.  Neurological: She is alert and oriented to person, place, and time.  Skin: Skin is warm and dry.   skin discoloration over radiation area of left breast.   CMP Latest Ref Rng & Units 08/23/2017  Glucose 65 - 99 mg/dL 125(H)  BUN 6 - 20 mg/dL 18  Creatinine 0.44 - 1.00 mg/dL 0.73  Sodium 135 - 145 mmol/L 139  Potassium 3.5 - 5.1 mmol/L 4.3  Chloride 101 - 111 mmol/L 104  CO2 22 - 32 mmol/L 26  Calcium 8.9 - 10.3 mg/dL 9.4  Total Protein 6.5 - 8.1 g/dL 7.2  Total Bilirubin 0.3 - 1.2 mg/dL 0.5  Alkaline Phos 38 - 126 U/L 155(H)  AST 15 - 41 U/L 27  ALT 14 - 54 U/L 27   CBC Latest Ref Rng & Units 07/06/2017  WBC 3.6 - 11.0 K/uL 7.2  Hemoglobin 12.0 - 16.0 g/dL 13.7  Hematocrit 35.0 - 47.0 % 40.4  Platelets 150 - 440 K/uL 236     Assessment and plan- Patient is a 67 y.o. female  with pathologically prognostic stage Ia invasive mammary carcinomaof the right breastpT1b  pN0 cM0 ER PR positive HER-2/neu negative status post lumpectomy and adjuvant RT and currently on arimidex  She is tolerating arimidex well and will continue it for 5 years. She is on weekly vit D. I have also asked her to take calcium 1200 mg daily. She did have worsening osteopenia on her bone density in jan 2019. Given that she is on arimidex I would like her to get zometa Q18 months for 6 years per recent NEJM article by [email or web address]. She says she has obtained dental clearance. We will touch base with Dr. Maretta Los office regarding this and schedule her zometa accordingly  I will see her back in 3 month. No labs. She will call us if she has any questions or concerns    Visit Diagnosis 1. Malignant neoplasm of upper-outer quadrant of left breast in female, estrogen receptor positive ([place])   2. Visit for monitoring Arimidex therapy      Dr. Randa Evens, MD, MPH [facility] [phone] 08/23/2017 3:51 PM

## 2017-08-23 NOTE — Progress Notes (Signed)
No new changes noted today 

## 2017-08-23 NOTE — Progress Notes (Signed)
Radiation Oncology Follow up Note  Name: April Leblanc   Date:   08/23/2017 MRN:  [id number] DOB: [birth year]    This 67 y.o. female presents to the clinic today for one-month follow-up status post whole breast radiation to her left breast for stage I ER positive PR borderline invasive mammary carcinoma.  REFERRING PROVIDER: Clarisse Gouge, MD  HPI: patient is a 67 year old female now 1 month out having completed whole breast radiation to her left breast for stage I invasive mammary carcinoma ER positive PR borderline. Seen today in routine follow up she is doing well. She specifically denies breast tenderness cough or bone pain..she's been started on arimadex tolerating that well without side effect.  COMPLICATIONS OF TREATMENT: none  FOLLOW UP COMPLIANCE: keeps appointments   PHYSICAL EXAM:  There were no vitals taken for this visit. Lungs are clear to A&P cardiac examination essentially unremarkable with regular rate and rhythm. No dominant mass or nodularity is noted in either breast in 2 positions examined. Incision is well-healed. No axillary or supraclavicular adenopathy is appreciated. Cosmetic result is excellent.she does have a slight well-circumscribed nodular area in her's lipectomy site not concerning for malignancy. Well-developed well-nourished patient in NAD. HEENT reveals PERLA, EOMI, discs not visualized.  Oral cavity is clear. No oral mucosal lesions are identified. Neck is clear without evidence of cervical or supraclavicular adenopathy. Lungs are clear to A&P. Cardiac examination is essentially unremarkable with regular rate and rhythm without murmur rub or thrill. Abdomen is benign with no organomegaly or masses noted. Motor sensory and DTR levels are equal and symmetric in the upper and lower extremities. Cranial nerves II through XII are grossly intact. Proprioception is intact. No peripheral adenopathy or edema is identified. No motor or sensory levels are noted.  Crude visual fields are within normal range.  RADIOLOGY RESULTS:no current films for review  PLAN: present time patient is doing well recovering nicely from her radiation therapy treatments. I am please were overall progress. I've asked to see her back in 4-5 months for follow-up. She continues on arimadex without side effect. Patient is to call with any concerns.  I would like to take this opportunity to thank you for allowing me to participate in the care of your patient.Noreene Filbert, MD

## 2017-08-30 NOTE — Telephone Encounter (Signed)
Follow up call made to dental office on 08/23/17 @ 357pm.

## 2017-10-15 ENCOUNTER — Other Ambulatory Visit: Payer: Self-pay | Admitting: Oncology

## 2017-10-15 DIAGNOSIS — M8580 Other specified disorders of bone density and structure, unspecified site: Secondary | ICD-10-CM

## 2017-11-09 ENCOUNTER — Other Ambulatory Visit: Payer: Self-pay | Admitting: Oncology

## 2017-11-09 ENCOUNTER — Other Ambulatory Visit: Payer: Self-pay

## 2017-11-09 MED ORDER — ANASTROZOLE 1 MG PO TABS: 1.0000 mg | ORAL_TABLET | Freq: Every day | ORAL | 0 refills | Status: DC

## 2017-11-22 ENCOUNTER — Inpatient Hospital Stay: Payer: Medicare Other | Attending: Oncology | Admitting: Oncology

## 2017-11-22 ENCOUNTER — Encounter: Payer: Self-pay | Admitting: Oncology

## 2017-11-22 VITALS — BP 135/85 | HR 87 | Temp 98.3°F | Resp 18 | Ht 67.5 in | Wt 175.3 lb

## 2017-11-22 DIAGNOSIS — C50412 Malignant neoplasm of upper-outer quadrant of left female breast: Secondary | ICD-10-CM | POA: Insufficient documentation

## 2017-11-22 DIAGNOSIS — Z982 Presence of cerebrospinal fluid drainage device: Secondary | ICD-10-CM | POA: Insufficient documentation

## 2017-11-22 DIAGNOSIS — E039 Hypothyroidism, unspecified: Secondary | ICD-10-CM

## 2017-11-22 DIAGNOSIS — R7303 Prediabetes: Secondary | ICD-10-CM | POA: Diagnosis not present

## 2017-11-22 DIAGNOSIS — Z90710 Acquired absence of both cervix and uterus: Secondary | ICD-10-CM | POA: Diagnosis not present

## 2017-11-22 DIAGNOSIS — Z853 Personal history of malignant neoplasm of breast: Secondary | ICD-10-CM

## 2017-11-22 DIAGNOSIS — Z809 Family history of malignant neoplasm, unspecified: Secondary | ICD-10-CM | POA: Insufficient documentation

## 2017-11-22 DIAGNOSIS — E785 Hyperlipidemia, unspecified: Secondary | ICD-10-CM | POA: Insufficient documentation

## 2017-11-22 DIAGNOSIS — G4730 Sleep apnea, unspecified: Secondary | ICD-10-CM | POA: Insufficient documentation

## 2017-11-22 DIAGNOSIS — Z803 Family history of malignant neoplasm of breast: Secondary | ICD-10-CM | POA: Insufficient documentation

## 2017-11-22 DIAGNOSIS — M8580 Other specified disorders of bone density and structure, unspecified site: Secondary | ICD-10-CM

## 2017-11-22 DIAGNOSIS — Z79899 Other long term (current) drug therapy: Secondary | ICD-10-CM | POA: Diagnosis not present

## 2017-11-22 DIAGNOSIS — Z8051 Family history of malignant neoplasm of kidney: Secondary | ICD-10-CM | POA: Diagnosis not present

## 2017-11-22 DIAGNOSIS — Z170 Estrogen receptor positive status [ER+]: Secondary | ICD-10-CM

## 2017-11-22 DIAGNOSIS — K219 Gastro-esophageal reflux disease without esophagitis: Secondary | ICD-10-CM | POA: Diagnosis not present

## 2017-11-22 DIAGNOSIS — Z79811 Long term (current) use of aromatase inhibitors: Secondary | ICD-10-CM | POA: Diagnosis not present

## 2017-11-22 DIAGNOSIS — Z800 Family history of malignant neoplasm of digestive organs: Secondary | ICD-10-CM | POA: Diagnosis not present

## 2017-11-22 DIAGNOSIS — Z08 Encounter for follow-up examination after completed treatment for malignant neoplasm: Secondary | ICD-10-CM

## 2017-11-22 DIAGNOSIS — Z780 Asymptomatic menopausal state: Secondary | ICD-10-CM

## 2017-11-22 NOTE — Progress Notes (Signed)
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 Mammogram showed possible asymmetry in the right breast and possible mass and distortion in the left breast. Diagnostic bilateral mammogram showed suspicious mass in the left breast at 10 o'clock position 6 x 4 x 5 mm in size. No evidence of left axillary adenopathy. Asymmetry seen in the right breast resolved on Tomosyntesis images and consistent with overlapping fibroglandular tissue.  Patient underwent ultrasound-guided core biopsy of the left breast lesion which showed invasive mammary carcinoma, grade 1. Greater than 90% ER positive, 1% PR positive and HER-2 equivocal by IHC. FISH testing for HER-2 is currently pending  She is G2 P2 L2. Remote use of hormone contraception. Menarche at the age of 74. She had hysterectomy in her 62s. No prior abnormal breast mammograms or breast biopsies. Family history significant for prostate cancer in her brother. Breast cancer in maternal aunt and 2 maternal first cousins. She does not know if they have been tested for breast cancer  Patient underwent lumpectomy with sentinel lymph node biopsy on 04/13/2017. 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 Margins were negative for invasive and in situ carcinoma. 0 out of 1 sentinel lymph node positive for  malignancy. ER greater than 90% positive PR 1% positive HER-2 FISH negative.Margins were 2 mm for invasive carcinoma and DCIS 0.5 mm  Bone density scan in September 2016 showed osteopenia with a T score of -2.1 at the AP spine. 10-year probability of a major osteoporotic fracture was 16% and major hip fracture was 2% back then  oncotype testing was not recommended for a 45m grade 1 tumor and hence she did not require adjuvant chemotherapy. She has completed adjuavnt RT and started taking arimidex in March 2019    Interval history- she is tolerating arimidex except for mild self limited hot flashes. Appetite is good. Denies any unintentional weight loss or aches or pains anywhere  ECOG PS- 1 Pain scale- 0 Opioid associated constipation- no  Review of systems- Review of Systems  Constitutional: Negative for chills, fever, malaise/fatigue and weight loss.  HENT: Negative for congestion, ear discharge and nosebleeds.   Eyes: Negative for blurred vision.  Respiratory: Negative for cough, hemoptysis, sputum production, shortness of breath and wheezing.   Cardiovascular: Negative for chest pain, palpitations, orthopnea and claudication.  Gastrointestinal: Negative for abdominal pain, blood in stool, constipation, diarrhea, heartburn, melena, nausea and vomiting.  Genitourinary: Negative for dysuria, flank pain, frequency, hematuria and urgency.  Musculoskeletal: Negative for back pain, joint pain and myalgias.  Skin: Negative for rash.  Neurological: Negative for dizziness, tingling, focal weakness, seizures, weakness and headaches.  Endo/Heme/Allergies: Does not bruise/bleed easily.  Psychiatric/Behavioral: Negative for depression and suicidal ideas. The patient does not have insomnia.       Allergies  Allergen Reactions  . Amoxicillin Hives, Itching and Other (See Comments)    Has patient had a PCN reaction causing immediate  rash, facial/tongue/throat swelling, SOB or  lightheadedness with hypotension: No Has patient had a PCN reaction causing severe rash involving mucus membranes or skin necrosis: No Has patient had a PCN reaction that required hospitalization: No Has patient had a PCN reaction occurring within the last 10 years: No If all of the above answers are "NO", then may proceed with Cephalosporin use.   . Erythromycin Hives and Itching  . Sulfa Antibiotics Hives and Itching  . Iodinated Diagnostic Agents Rash    Red rash and itching. Topical iodine not a problem.  . Morphine Nausea And Vomiting and Other (See Comments)    Pretty severe vomiting. Can take hydrocodone and oxycodone       Past Medical History:  Diagnosis Date  . Anemia   . Ankle sprain 03/2017  . Arthritis   . Breast cancer ([place]) 2018  . Breast cancer ([place]) 03/2017  . Depression   . Fracture of distal end of radius 03/2017   left  . Genetic testing 05/04/2017   Multi-Cancer panel (83 genes) @ [facility] - No pathogenic mutations detected  . GERD (gastroesophageal reflux disease)   . Headache   . Hyperlipidemia   . Hypothyroidism   . Pre-diabetes 03/2017   follows healthy diabetic diet  . Sleep apnea    has not used cpap in a long time  . Vitamin B 12 deficiency      Past Surgical History:  Procedure Laterality Date  . ABDOMINAL HYSTERECTOMY  1983  . APPENDECTOMY  1977   when she had c-section  . BREAST BIOPSY Left    INVASIVE MAMMARY CARCINOMA Grade 1  . BREAST LUMPECTOMY Left 04/13/2017   INVASIVE MAMMARY CARCINOMA   . CESAREAN SECTION    . COLONOSCOPY    . ESOPHAGOGASTRODUODENOSCOPY (EGD) WITH PROPOFOL N/A 02/05/2015   Procedure: ESOPHAGOGASTRODUODENOSCOPY (EGD) WITH PROPOFOL;  Surgeon: Josefine Class, MD;  Location: [facility] ENDOSCOPY;  Service: Endoscopy;  Laterality: N/A;  . PARTIAL MASTECTOMY WITH NEEDLE LOCALIZATION Left 04/13/2017   Procedure: PARTIAL MASTECTOMY WITH NEEDLE LOCALIZATION;  Surgeon: Leonie Green, MD;  Location: [facility] ORS;   Service: General;  Laterality: Left;  . PATELLA FRACTURE SURGERY Left 1991   screws in place  . SAVORY DILATION N/A 02/05/2015   Procedure: SAVORY DILATION;  Surgeon: Josefine Class, MD;  Location: [facility] ENDOSCOPY;  Service: Endoscopy;  Laterality: N/A;  . SENTINEL NODE BIOPSY Left 04/13/2017   Procedure: SENTINEL NODE BIOPSY;  Surgeon: Leonie Green, MD;  Location: [facility] ORS;  Service: General;  Laterality: Left;  . SINUS SURGERY WITH INSTATRAK      Social History   Socioeconomic History  . Marital status: Married    Spouse name: Not on file  . Number of children: Not on file  . Years of education: Not on file  . Highest education level: Not on file  Occupational History  . Not on file  Social Needs  . Financial resource strain: Not on file  . Food insecurity:    Worry: Not on file    Inability: Not on file  . Transportation needs:    Medical: Not on file    Non-medical: Not on file  Tobacco Use  . Smoking status: Never Smoker  . Smokeless tobacco: Never Used  Substance and Sexual Activity  . Alcohol use: No    Frequency: Never  . Drug use: No  . Sexual activity: Not on file  Lifestyle  . Physical activity:    Days per week: Not on  file    Minutes per session: Not on file  . Stress: Not on file  Relationships  . Social connections:    Talks on phone: Not on file    Gets together: Not on file    Attends religious service: Not on file    Active member of club or organization: Not on file    Attends meetings of clubs or organizations: Not on file    Relationship status: Not on file  . Intimate partner violence:    Fear of current or ex partner: Not on file    Emotionally abused: Not on file    Physically abused: Not on file    Forced sexual activity: Not on file  Other Topics Concern  . Not on file  Social History Narrative  . Not on file    Family History  Problem Relation Age of Onset  . Breast cancer Maternal Aunt 70       deceased 28s  .  Breast cancer Cousin 63       daughter of mat aunt with breast cancer  . Breast cancer Cousin 72       daughter of mat aunt with breast cancer  . Deep vein thrombosis Mother   . Heart disease Mother   . Stroke Mother   . Heart disease Father   . Parkinson's disease Father   . Heart disease Sister   . Stroke Sister   . Skin cancer Brother   . Heart disease Brother   . Alzheimer's disease Brother   . Lung cancer Paternal Uncle        deceased 66s  . Heart disease Brother   . Alzheimer's disease Brother   . Heart disease Brother   . Alzheimer's disease Brother   . Cancer Brother        unclear primary; currently 81s  . Cirrhosis Sister   . Alzheimer's disease Sister   . Kidney cancer Son 68       nephrectomy @ [facility]     Current Outpatient Medications:  .  acetaminophen (TYLENOL) 500 MG tablet, Take 1,000 mg by mouth every 6 (six) hours as needed for moderate pain or headache., Disp: , Rfl:  .  amphetamine-dextroamphetamine (ADDERALL XR) 10 MG 24 hr capsule, Take 10 mg by mouth daily., Disp: , Rfl:  .  anastrozole (ARIMIDEX) 1 MG tablet, TAKE 1 TABLET BY MOUTH EVERY DAY, Disp: 90 tablet, Rfl: 0 .  atorvastatin (LIPITOR) 40 MG tablet, Take 40 mg by mouth daily. , Disp: , Rfl:  .  darifenacin (ENABLEX) 15 MG 24 hr tablet, Take 15 mg by mouth daily., Disp: , Rfl:  .  esomeprazole (NEXIUM) 40 MG capsule, Take 40 mg by mouth daily. , Disp: , Rfl:  .  Estradiol 10 MCG TABS vaginal tablet, Place vaginally., Disp: , Rfl:  .  fluticasone (FLONASE) 50 MCG/ACT nasal spray, Place 2 sprays into both nostrils daily as needed for allergies. , Disp: , Rfl:  .  levothyroxine (SYNTHROID, LEVOTHROID) 150 MCG tablet, Take 150 mcg by mouth daily before breakfast., Disp: , Rfl:  .  LORazepam (ATIVAN) 0.5 MG tablet, Take 0.5 mg by mouth 2 (two) times daily. May take up to three times a day if needed., Disp: , Rfl:  .  montelukast (SINGULAIR) 10 MG tablet, Take 10 mg by mouth daily. , Disp: , Rfl:  .   ranitidine (ZANTAC) 300 MG capsule, Take 300 mg by mouth at bedtime. , Disp: , Rfl:  .  traZODone (DESYREL) 100 MG tablet, Take 100 mg by mouth at bedtime., Disp: , Rfl:  .  vitamin B-12 (CYANOCOBALAMIN) 1000 MCG tablet, Take 1,000 mcg by mouth daily., Disp: , Rfl:  .  Vitamin D, Ergocalciferol, (DRISDOL) 50000 units CAPS capsule, Take 50,000 Units by mouth every 7 (seven) days., Disp: , Rfl:  .  Vortioxetine HBr (TRINTELLIX) 20 MG TABS, Take 20 mg by mouth daily., Disp: , Rfl:   Physical exam:  Vitals:   11/22/17 1452  BP: 135/85  Pulse: 87  Resp: 18  Temp: 98.3 F (36.8 C)  TempSrc: Tympanic  SpO2: 100%  Weight: 175 lb 4.8 oz (79.5 kg)  Height: 5' 7.5" (1.715 m)   Physical Exam  Constitutional: She is oriented to person, place, and time. She appears well-developed and well-nourished.  HENT:  Head: Normocephalic and atraumatic.  Eyes: Pupils are equal, round, and reactive to light. EOM are normal.  Neck: Normal range of motion.  Cardiovascular: Normal rate, regular rhythm and normal heart sounds.  Pulmonary/Chest: Effort normal and breath sounds normal.  Abdominal: Soft. Bowel sounds are normal.  Neurological: She is alert and oriented to person, place, and time.  Skin: Skin is warm and dry.   Breast exam was performed in seated and lying down position. Patient is status post left lumpectomy with a well-healed surgical scar. No evidence of any palpable masses. No evidence of axillary adenopathy. No evidence of any palpable masses or lumps in the right breast. No evidence of right axillary adenopathy   CMP Latest Ref Rng & Units 08/23/2017  Glucose 65 - 99 mg/dL 125(H)  BUN 6 - 20 mg/dL 18  Creatinine 0.44 - 1.00 mg/dL 0.73  Sodium 135 - 145 mmol/L 139  Potassium 3.5 - 5.1 mmol/L 4.3  Chloride 101 - 111 mmol/L 104  CO2 22 - 32 mmol/L 26  Calcium 8.9 - 10.3 mg/dL 9.4  Total Protein 6.5 - 8.1 g/dL 7.2  Total Bilirubin 0.3 - 1.2 mg/dL 0.5  Alkaline Phos 38 - 126 U/L 155(H)    AST 15 - 41 U/L 27  ALT 14 - 54 U/L 27   CBC Latest Ref Rng & Units 07/06/2017  WBC 3.6 - 11.0 K/uL 7.2  Hemoglobin 12.0 - 16.0 g/dL 13.7  Hematocrit 35.0 - 47.0 % 40.4  Platelets 150 - 440 K/uL 236        Assessment and plan- Patient is a 67 y.o. female with pathologically prognostic stage Ia invasive mammary carcinomaof the right breastpT1b pN0 cM0 ER PR positive HER-2/neu negative status post lumpectomy and adjuvant RT and currently on arimidex. She is here for routine f/u of breast cancer  Overall tolerating arimidex well and she will continue that along with ca- vit D for 5 years  Will repeat bone density in jan 2020. If it is stable, will hold off on bisphosphonates. If worse, will consider giving zometa q6 months  Will touch base with Dr. Peyton Najjar regarding scheduling her mammogram in oct 2019  I will see her back in 4 months after bone density with cbc/ cmp possible zometa     Visit Diagnosis 1. Encounter for follow-up surveillance of breast cancer   2. Osteopenia, unspecified location   3. Malignant neoplasm of upper-outer quadrant of left breast in female, estrogen receptor positive ([place])   4. Post-menopausal      Dr. Randa Evens, MD, MPH [facility] [place] [phone] 11/22/2017 2:48 PM

## 2017-11-22 NOTE — Progress Notes (Signed)
No new changes noted today 

## 2017-11-23 ENCOUNTER — Ambulatory Visit: Payer: Medicare Other | Admitting: Oncology

## 2017-11-23 ENCOUNTER — Other Ambulatory Visit: Payer: Medicare Other

## 2018-02-01 ENCOUNTER — Other Ambulatory Visit: Payer: Self-pay | Admitting: Oncology

## 2018-02-04 ENCOUNTER — Ambulatory Visit
Admission: RE | Admit: 2018-02-04 | Discharge: 2018-02-04 | Disposition: A | Discharge status: Home / Self Care | Payer: Medicare Other | Source: Ambulatory Visit | Attending: Radiation Oncology | Admitting: Radiation Oncology

## 2018-02-04 DIAGNOSIS — Z170 Estrogen receptor positive status [ER+]: Principal | ICD-10-CM

## 2018-02-04 DIAGNOSIS — C50411 Malignant neoplasm of upper-outer quadrant of right female breast: Secondary | ICD-10-CM

## 2018-02-04 NOTE — Progress Notes (Signed)
Radiation Oncology Follow up Note  Name: April Leblanc   Date:   02/04/2018 MRN:  [id number] DOB: 29-Jan-1951    This 67 y.o. female presents to the clinic today for six-month follow-up status post whole breast radiation to her left breast for stage I ER positive PR borderline invasive mammary carcinoma status post wide local excision.  REFERRING PROVIDER: Clarisse Gouge, MD  HPI: patient is a 67 year old female now out 6 months having completed whole breast radiation to her left breast for stage I ER positive PR borderline invasive mammary carcinoma status post wide local excision. Seen today in routine follow up she is doing well. She specifically denies breast tenderness cough or bone pain..she's not had yet follow-up mammogram and is scheduled for the next several months.she is currently on aromatase tolerate that well without side effect.  COMPLICATIONS OF TREATMENT: none  FOLLOW UP COMPLIANCE: keeps appointments   PHYSICAL EXAM:  There were no vitals taken for this visit. Lungs are clear to A&P cardiac examination essentially unremarkable with regular rate and rhythm. No dominant mass or nodularity is noted in either breast in 2 positions examined. Incision is well-healed. No axillary or supraclavicular adenopathy is appreciated. Cosmetic result is excellent.Well-developed well-nourished patient in NAD. HEENT reveals PERLA, EOMI, discs not visualized.  Oral cavity is clear. No oral mucosal lesions are identified. Neck is clear without evidence of cervical or supraclavicular adenopathy. Lungs are clear to A&P. Cardiac examination is essentially unremarkable with regular rate and rhythm without murmur rub or thrill. Abdomen is benign with no organomegaly or masses noted. Motor sensory and DTR levels are equal and symmetric in the upper and lower extremities. Cranial nerves II through XII are grossly intact. Proprioception is intact. No peripheral adenopathy or edema is identified. No motor  or sensory levels are noted. Crude visual fields are within normal range.  RADIOLOGY RESULTS: no current films for review  PLAN: present time patient is doing well with no evidence of disease. I will see her back in 6 months for follow-up and then start once your follow-up visits. Patient is to call at anytime with any concerns. She continues on aromatase without side effect.she also have mammograms which are scheduled the next several months. Patient is to call with any concerns.  I would like to take this opportunity to thank you for allowing me to participate in the care of your patient.Noreene Filbert, MD

## 2018-03-07 ENCOUNTER — Other Ambulatory Visit: Payer: Self-pay | Admitting: Surgery

## 2018-03-07 DIAGNOSIS — Z853 Personal history of malignant neoplasm of breast: Secondary | ICD-10-CM

## 2018-03-26 ENCOUNTER — Ambulatory Visit
Admission: RE | Admit: 2018-03-26 | Discharge: 2018-03-26 | Disposition: A | Discharge status: Home / Self Care | Payer: Medicare Other | Source: Ambulatory Visit | Attending: Surgery | Admitting: Surgery

## 2018-03-26 DIAGNOSIS — Z853 Personal history of malignant neoplasm of breast: Secondary | ICD-10-CM

## 2018-03-26 HISTORY — DX: Personal history of irradiation: Z92.3

## 2018-05-13 ENCOUNTER — Other Ambulatory Visit: Payer: Self-pay | Admitting: Oncology

## 2018-05-20 ENCOUNTER — Ambulatory Visit
Admission: RE | Admit: 2018-05-20 | Discharge: 2018-05-20 | Disposition: A | Discharge status: Home / Self Care | Payer: Medicare Other | Source: Ambulatory Visit | Attending: Oncology | Admitting: Oncology

## 2018-05-20 DIAGNOSIS — Z780 Asymptomatic menopausal state: Secondary | ICD-10-CM | POA: Insufficient documentation

## 2018-05-20 DIAGNOSIS — Z170 Estrogen receptor positive status [ER+]: Secondary | ICD-10-CM | POA: Diagnosis present

## 2018-05-20 DIAGNOSIS — C50412 Malignant neoplasm of upper-outer quadrant of left female breast: Secondary | ICD-10-CM | POA: Insufficient documentation

## 2018-05-20 DIAGNOSIS — M8580 Other specified disorders of bone density and structure, unspecified site: Secondary | ICD-10-CM | POA: Diagnosis present

## 2018-05-23 ENCOUNTER — Inpatient Hospital Stay (HOSPITAL_BASED_OUTPATIENT_CLINIC_OR_DEPARTMENT_OTHER): Payer: Medicare Other | Admitting: Oncology

## 2018-05-23 ENCOUNTER — Encounter: Payer: Self-pay | Admitting: Oncology

## 2018-05-23 ENCOUNTER — Inpatient Hospital Stay: Payer: Medicare Other

## 2018-05-23 ENCOUNTER — Other Ambulatory Visit: Payer: Self-pay | Admitting: Oncology

## 2018-05-23 ENCOUNTER — Inpatient Hospital Stay: Payer: Medicare Other | Attending: Oncology

## 2018-05-23 VITALS — BP 132/84 | HR 77 | Temp 98.2°F | Resp 17

## 2018-05-23 DIAGNOSIS — Z170 Estrogen receptor positive status [ER+]: Secondary | ICD-10-CM | POA: Diagnosis not present

## 2018-05-23 DIAGNOSIS — M8580 Other specified disorders of bone density and structure, unspecified site: Secondary | ICD-10-CM

## 2018-05-23 DIAGNOSIS — Z08 Encounter for follow-up examination after completed treatment for malignant neoplasm: Secondary | ICD-10-CM

## 2018-05-23 DIAGNOSIS — Z79811 Long term (current) use of aromatase inhibitors: Secondary | ICD-10-CM | POA: Insufficient documentation

## 2018-05-23 DIAGNOSIS — C50911 Malignant neoplasm of unspecified site of right female breast: Secondary | ICD-10-CM

## 2018-05-23 DIAGNOSIS — Z923 Personal history of irradiation: Secondary | ICD-10-CM

## 2018-05-23 DIAGNOSIS — C50412 Malignant neoplasm of upper-outer quadrant of left female breast: Secondary | ICD-10-CM

## 2018-05-23 DIAGNOSIS — Z853 Personal history of malignant neoplasm of breast: Secondary | ICD-10-CM

## 2018-05-23 LAB — CBC WITH DIFFERENTIAL/PLATELET
Abs Immature Granulocytes: 0.04 10*3/uL (ref 0.00–0.07)
BASOS PCT: 0 %
Basophils Absolute: 0 10*3/uL (ref 0.0–0.1)
Eosinophils Absolute: 0.2 10*3/uL (ref 0.0–0.5)
Eosinophils Relative: 2 %
HCT: 39 % (ref 36.0–46.0)
Hemoglobin: 12.7 g/dL (ref 12.0–15.0)
Immature Granulocytes: 1 %
Lymphocytes Relative: 23 %
Lymphs Abs: 2 10*3/uL (ref 0.7–4.0)
MCH: 27.7 pg (ref 26.0–34.0)
MCHC: 32.6 g/dL (ref 30.0–36.0)
MCV: 85 fL (ref 80.0–100.0)
Monocytes Absolute: 0.5 10*3/uL (ref 0.1–1.0)
Monocytes Relative: 6 %
NRBC: 0 % (ref 0.0–0.2)
Neutro Abs: 6.1 10*3/uL (ref 1.7–7.7)
Neutrophils Relative %: 68 %
Platelets: 249 10*3/uL (ref 150–400)
RBC: 4.59 MIL/uL (ref 3.87–5.11)
RDW: 13.2 % (ref 11.5–15.5)
WBC: 8.9 10*3/uL (ref 4.0–10.5)

## 2018-05-23 LAB — COMPREHENSIVE METABOLIC PANEL
ALBUMIN: 4.1 g/dL (ref 3.5–5.0)
ALT: 28 U/L (ref 0–44)
AST: 23 U/L (ref 15–41)
Alkaline Phosphatase: 161 U/L — ABNORMAL HIGH (ref 38–126)
Anion gap: 9 (ref 5–15)
BUN: 19 mg/dL (ref 8–23)
CO2: 26 mmol/L (ref 22–32)
Calcium: 9.4 mg/dL (ref 8.9–10.3)
Chloride: 104 mmol/L (ref 98–111)
Creatinine, Ser: 0.69 mg/dL (ref 0.44–1.00)
GFR calc Af Amer: >60 mL/min (ref >60)
GFR calc non Af Amer: >60 mL/min (ref >60)
Glucose, Bld: 102 mg/dL — ABNORMAL HIGH (ref 70–99)
Potassium: 4 mmol/L (ref 3.5–5.1)
Sodium: 139 mmol/L (ref 135–145)
Total Bilirubin: 0.4 mg/dL (ref 0.3–1.2)
Total Protein: 7.5 g/dL (ref 6.5–8.1)

## 2018-05-23 MED ORDER — SODIUM CHLORIDE 0.9 % IV SOLN
Freq: Once | INTRAVENOUS | Status: AC
  Administered 2018-05-23: 15:00:00 via INTRAVENOUS
  Filled 2018-05-23: qty 250

## 2018-05-23 MED ORDER — ZOLEDRONIC ACID 5 MG/100ML IV SOLN
5.0000 mg | INTRAVENOUS | Status: DC
  Administered 2018-05-23: 5 mg via INTRAVENOUS
  Filled 2018-05-23: qty 100

## 2018-05-23 NOTE — Progress Notes (Signed)
Patient taking anastrazole. Having a lot of R hand arthritis, pain, redness and swelling.

## 2018-05-24 NOTE — Progress Notes (Signed)
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 Mammogram showed possible asymmetry in the right breast and possible mass and distortion in the left breast. Diagnostic bilateral mammogram showed suspicious mass in the left breast at 10 o'clock position 6 x 4 x 5 mm in size. No evidence of left axillary adenopathy. Asymmetry seen in the right breast resolved on Tomosyntesis images and consistent with overlapping fibroglandular tissue.  Patient underwent ultrasound-guided core biopsy of the left breast lesion which showed invasive mammary carcinoma, grade 1. Greater than 90% ER positive, 1% PR positive and HER-2 equivocal by IHC. FISH testing for HER-2 is currently pending  She is G2 P2 L2. Remote use of hormone contraception. Menarche at the age of 80. She had hysterectomy in her 37s. No prior abnormal breast mammograms or breast biopsies. Family history significant for prostate cancer in her brother. Breast cancer in maternal aunt and 2 maternal first cousins. She does not know if they have been tested for breast cancer  Patient underwent lumpectomy with sentinel lymph node biopsy on 04/13/2017. 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 Margins were negative for invasive and in situ carcinoma. 0 out of 1 sentinel lymph node  positive for malignancy. ER greater than 90% positive PR 1% positive HER-2 FISH negative.Margins were 2 mm for invasive carcinoma and DCIS 0.5 mm  Bone density scan in September 2016 showed osteopenia with a T score of -2.1 at the AP spine. 10-year probability of a major osteoporotic fracture was 16% and major hip fracture was 2% back then  oncotype testing was not recommended for a 25m grade 1 tumor and hence she did not require adjuvant chemotherapy. She hascompleted adjuavnt RT and started taking arimidex in March 2019   Interval history-she is doing well on Arimidex and calcium vitamin D.  Reports no significant complaints  ECOG PS- 1 Pain scale- 0   Review of systems- Review of Systems  Constitutional: Negative for chills, fever, malaise/fatigue and weight loss.  HENT: Negative for congestion, ear discharge and nosebleeds.   Eyes: Negative for blurred vision.  Respiratory: Negative for cough, hemoptysis, sputum production, shortness of breath and wheezing.   Cardiovascular: Negative for chest pain, palpitations, orthopnea and claudication.  Gastrointestinal: Negative for abdominal pain, blood in stool, constipation, diarrhea, heartburn, melena, nausea and vomiting.  Genitourinary: Negative for dysuria, flank pain, frequency, hematuria and urgency.  Musculoskeletal: Negative for back pain, joint pain and myalgias.  Skin: Negative for rash.  Neurological: Negative for dizziness, tingling, focal weakness, seizures, weakness and headaches.  Endo/Heme/Allergies: Does not bruise/bleed easily.  Psychiatric/Behavioral: Negative for depression and suicidal ideas. The patient does not have insomnia.        Allergies  Allergen Reactions  . Amoxicillin Hives, Itching and Other (See Comments)    Has patient had a PCN reaction causing immediate rash, facial/tongue/throat swelling, SOB or lightheadedness with hypotension: No Has patient had a PCN reaction  causing severe rash involving  mucus membranes or skin necrosis: No Has patient had a PCN reaction that required hospitalization: No Has patient had a PCN reaction occurring within the last 10 years: No If all of the above answers are "NO", then may proceed with Cephalosporin use.   . Erythromycin Hives and Itching  . Sulfa Antibiotics Hives and Itching  . Iodinated Diagnostic Agents Rash    Red rash and itching. Topical iodine not a problem.  . Morphine Nausea And Vomiting and Other (See Comments)    Pretty severe vomiting. Can take hydrocodone and oxycodone       Past Medical History:  Diagnosis Date  . Anemia   . Ankle sprain 03/2017  . Arthritis   . Breast cancer ([place]) 2018  . Breast cancer ([place]) 03/2017  . Depression   . Fracture of distal end of radius 03/2017   left  . Genetic testing 05/04/2017   Multi-Cancer panel (83 genes) @ [facility] - No pathogenic mutations detected  . GERD (gastroesophageal reflux disease)   . Headache   . Hyperlipidemia   . Hypothyroidism   . Personal history of radiation therapy   . Pre-diabetes 03/2017   follows healthy diabetic diet  . Sleep apnea    has not used cpap in a long time  . Vitamin B 12 deficiency      Past Surgical History:  Procedure Laterality Date  . ABDOMINAL HYSTERECTOMY  1983  . APPENDECTOMY  1977   when she had c-section  . BREAST BIOPSY Left 03/27/2017   INVASIVE MAMMARY CARCINOMA Grade 1 UIG  . BREAST LUMPECTOMY Left 04/13/2017   INVASIVE MAMMARY CARCINOMA/ DCIS, CLEAR MARGINS, NEG. LN  . CESAREAN SECTION    . COLONOSCOPY    . ESOPHAGOGASTRODUODENOSCOPY (EGD) WITH PROPOFOL N/A 02/05/2015   Procedure: ESOPHAGOGASTRODUODENOSCOPY (EGD) WITH PROPOFOL;  Surgeon: Josefine Class, MD;  Location[facility]COPY;  Service: Endoscopy;  Laterality: N/A;  . PARTIAL MASTECTOMY WITH NEEDLE LOCALIZATION Left 04/13/2017   Procedure: PARTIAL MASTECTOMY WITH NEEDLE LOCALIZATION;  Surgeon: Leonie Green, MD;  Location: [facility] ORS;  Service:  General;  Laterality: Left;  . PATELLA FRACTURE SURGERY Left 1991   screws in place  . SAVORY DILATION N/A 02/05/2015   Procedure: SAVORY DILATION;  Surgeon: Josefine Class, MD;  Location: [facility] ENDOSCOPY;  Service: Endoscopy;  Laterality: N/A;  . SENTINEL NODE BIOPSY Left 04/13/2017   Procedure: SENTINEL NODE BIOPSY;  Surgeon: Leonie Green, MD;  Location: [facility] ORS;  Service: General;  Laterality: Left;  . SINUS SURGERY WITH INSTATRAK      Social History   Socioeconomic History  . Marital status: Married    Spouse name: Not on file  . Number of children: Not on file  . Years of education: Not on file  . Highest education level: Not on file  Occupational History  . Not on file  Social Needs  . Financial resource strain: Not on file  . Food insecurity:    Worry: Not on file    Inability: Not on file  . Transportation needs:    Medical: Not on file    Non-medical: Not on file  Tobacco Use  . Smoking status: Never Smoker  . Smokeless tobacco: Never Used  Substance and Sexual Activity  . Alcohol use: No    Frequency: Never  . Drug use: No  . Sexual activity: Not on file  Lifestyle  . Physical activity:    Days per week: Not on file  Minutes per session: Not on file  . Stress: Not on file  Relationships  . Social connections:    Talks on phone: Not on file    Gets together: Not on file    Attends religious service: Not on file    Active member of club or organization: Not on file    Attends meetings of clubs or organizations: Not on file    Relationship status: Not on file  . Intimate partner violence:    Fear of current or ex partner: Not on file    Emotionally abused: Not on file    Physically abused: Not on file    Forced sexual activity: Not on file  Other Topics Concern  . Not on file  Social History Narrative  . Not on file    Family History  Problem Relation Age of Onset  . Breast cancer Maternal Aunt 70       deceased 65s  . Breast cancer  Cousin 53       daughter of mat aunt with breast cancer  . Breast cancer Cousin 50       daughter of mat aunt with breast cancer  . Deep vein thrombosis Mother   . Heart disease Mother   . Stroke Mother   . Heart disease Father   . Parkinson's disease Father   . Heart disease Sister   . Stroke Sister   . Skin cancer Brother   . Heart disease Brother   . Alzheimer's disease Brother   . Lung cancer Paternal Uncle        deceased 21s  . Heart disease Brother   . Alzheimer's disease Brother   . Heart disease Brother   . Alzheimer's disease Brother   . Cancer Brother        unclear primary; currently 37s  . Cirrhosis Sister   . Alzheimer's disease Sister   . Kidney cancer Son 49       nephrectomy @ [facility]     Current Outpatient Medications:  .  acetaminophen (TYLENOL) 500 MG tablet, Take 1,000 mg by mouth every 6 (six) hours as needed for moderate pain or headache., Disp: , Rfl:  .  amphetamine-dextroamphetamine (ADDERALL XR) 10 MG 24 hr capsule, Take 10 mg by mouth daily., Disp: , Rfl:  .  anastrozole (ARIMIDEX) 1 MG tablet, TAKE 1 TABLET BY MOUTH EVERY DAY, Disp: 90 tablet, Rfl: 0 .  ARIPiprazole (ABILIFY) 2 MG tablet, Take 1 tablet by mouth at bedtime., Disp: , Rfl:  .  atorvastatin (LIPITOR) 40 MG tablet, Take 40 mg by mouth daily. , Disp: , Rfl:  .  darifenacin (ENABLEX) 15 MG 24 hr tablet, Take 15 mg by mouth daily., Disp: , Rfl:  .  esomeprazole (NEXIUM) 40 MG capsule, Take 40 mg by mouth daily. , Disp: , Rfl:  .  fluticasone (FLONASE) 50 MCG/ACT nasal spray, Place 2 sprays into both nostrils daily as needed for allergies. , Disp: , Rfl:  .  levothyroxine (SYNTHROID, LEVOTHROID) 150 MCG tablet, Take 150 mcg by mouth daily before breakfast., Disp: , Rfl:  .  LORazepam (ATIVAN) 0.5 MG tablet, Take 0.5 mg by mouth 2 (two) times daily. May take up to three times a day if needed., Disp: , Rfl:  .  montelukast (SINGULAIR) 10 MG tablet, Take 10 mg by mouth daily. , Disp: , Rfl:  .   ranitidine (ZANTAC) 300 MG capsule, Take 300 mg by mouth at bedtime. , Disp: , Rfl:  .  traZODone (DESYREL) 100 MG tablet, Take 100 mg by mouth at bedtime. Take  1 and 1/2 tablet for a total of 150m daily, Disp: , Rfl:  .  vitamin B-12 (CYANOCOBALAMIN) 1000 MCG tablet, Take 1,000 mcg by mouth daily., Disp: , Rfl:  .  Vitamin D, Ergocalciferol, (DRISDOL) 50000 units CAPS capsule, Take 50,000 Units by mouth every 7 (seven) days., Disp: , Rfl:  .  Vortioxetine HBr (TRINTELLIX) 20 MG TABS, Take 20 mg by mouth daily., Disp: , Rfl:  .  Estradiol 10 MCG TABS vaginal tablet, Place vaginally., Disp: , Rfl:   Physical exam:  Vitals:   05/23/18 1411  BP: 132/84  Pulse: 77  Resp: 17  Temp: 98.2 F (36.8 C)  TempSrc: Oral   Physical Exam Constitutional:      General: She is not in acute distress. HENT:     Head: Normocephalic and atraumatic.  Eyes:     Pupils: Pupils are equal, round, and reactive to light.  Neck:     Musculoskeletal: Normal range of motion.  Cardiovascular:     Rate and Rhythm: Normal rate and regular rhythm.     Heart sounds: Normal heart sounds.  Pulmonary:     Effort: Pulmonary effort is normal.     Breath sounds: Normal breath sounds.  Abdominal:     General: Bowel sounds are normal.     Palpations: Abdomen is soft.  Skin:    General: Skin is warm and dry.  Neurological:     Mental Status: She is alert and oriented to person, place, and time.    Breast exam was performed in seated and lying down position. Patient is status post left lumpectomy with a well-healed surgical scar. No evidence of any palpable masses. No evidence of axillary adenopathy. No evidence of any palpable masses or lumps in the right breast. No evidence of right axillary adenopathy   CMP Latest Ref Rng & Units 05/23/2018  Glucose 70 - 99 mg/dL 102(H)  BUN 8 - 23 mg/dL 19  Creatinine 0.44 - 1.00 mg/dL 0.69  Sodium 135 - 145 mmol/L 139  Potassium 3.5 - 5.1 mmol/L 4.0  Chloride 98 - 111  mmol/L 104  CO2 22 - 32 mmol/L 26  Calcium 8.9 - 10.3 mg/dL 9.4  Total Protein 6.5 - 8.1 g/dL 7.5  Total Bilirubin 0.3 - 1.2 mg/dL 0.4  Alkaline Phos 38 - 126 U/L 161(H)  AST 15 - 41 U/L 23  ALT 0 - 44 U/L 28   CBC Latest Ref Rng & Units 05/23/2018  WBC 4.0 - 10.5 K/uL 8.9  Hemoglobin 12.0 - 15.0 g/dL 12.7  Hematocrit 36.0 - 46.0 % 39.0  Platelets 150 - 400 K/uL 249    No images are attached to the encounter.  Dg Bone Density  Result Date: 05/20/2018 EXAM: DUAL X-RAY ABSORPTIOMETRY (DXA) FOR BONE MINERAL DENSITY IMPRESSION: Technologist: MTB Your patient GShaqueena Maucericompleted a BMD test on 05/20/2018 using the [place](analysis version: 14.10) manufactured by [employer] The following summarizes the results of our evaluation. PATIENT BIOGRAPHICAL: Name: LMckala, PantaleonPatient ID: [id number] Date: 0July 15, 1952Height: 67.0 in. Gender: Female Exam Date: 05/20/2018 Weight: 184.0 lbs. Indications: Advanced Age, Breast CA, Caucasian, History of Breast Cancer, History of Fracture (Adult), History of Osteoporosis, Hypothyroid, Hysterectomy, Osteopenia, Postmenopausal Fractures: Left knee, Clavicle, Left wrist Treatments: Arimidex, Flonase, Singulair, Synthroid, Vitamin D ASSESSMENT: The BMD measured at Forearm Radius 33% is 0.690 g/cm2 with a T-score of -2.1. This  patient is considered osteopenic according to [facility]) criteria. The quality of the scan is good. Site Region Measured Measured WHO Young Adult BMD Date       Age      Classification T-score AP Spine L1-L4 05/20/2018 67.3 Osteopenia -1.9 0.961 g/cm2 AP Spine L1-L4 05/17/2017 66.3 Osteopenia -1.8 0.968 g/cm2 DualFemur Neck Left 05/20/2018 67.3 Osteopenia -1.9 0.780 g/cm2 DualFemur Neck Left 05/17/2017 66.3 Osteopenia -1.9 0.776 g/cm2 DualFemur Total Mean 05/20/2018 67.3 Osteopenia -1.2 0.859 g/cm2 DualFemur Total Mean 05/17/2017 66.3 Osteopenia -1.2 0.853 g/cm2 Left Forearm Radius 33% 05/20/2018  67.3 Osteopenia -2.1 0.690 g/cm2 World Health Organization [facility]) criteria for post-menopausal, Caucasian Women: Normal:       T-score at or above -1 SD Osteopenia:   T-score between -1 and -2.5 SD Osteoporosis: T-score at or below -2.5 SD RECOMMENDATIONS: 1. All patients should optimize calcium and vitamin D intake. 2. Consider FDA-approved medical therapies in postmenopausal women and men aged 74 years and older, based on the following: a. A hip or vertebral(clinical or morphometric) fracture b. T-score < -2.5 at the femoral neck or spine after appropriate evaluation to exclude secondary causes c. Low bone mass (T-score between -1.0 and -2.5 at the femoral neck or spine) and a 10-year probability of a hip fracture > 3% or a 10-year probability of a major osteoporosis-related fracture > 20% based on the [place]-adapted WHO algorithm d. Clinician judgment and/or patient preferences may indicate treatment for people with 10-year fracture probabilities above or below these levels FOLLOW-UP: People with diagnosed cases of osteoporosis or at high risk for fracture should have regular bone mineral density tests. For patients eligible for Medicare, routine testing is allowed once every 2 years. The testing frequency can be increased to one year for patients who have rapidly progressing disease, those who are receiving or discontinuing medical therapy to restore bone mass, or have additional risk factors. FRAX* RESULTS:  (version: 3.5) 10-year Probability of Fracture1 Major Osteoporotic Fracture2 Hip Fracture 17.0% 2.6% Population: [place] (Caucasian) Risk Factors: History of Fracture (Adult) Based on Femur (Left) Neck BMD 1 -The 10-year probability of fracture may be lower than reported if the patient has received treatment. 2 -Major Osteoporotic Fracture: Clinical Spine, Forearm, Hip or Shoulder *FRAX is a Materials engineer of the [employer] of [employer] for Metabolic Bone Disease, a [place] (WHO)  [facility]. ASSESSMENT: The probability of a major osteoporotic fracture is 17.0% within the next ten years. The probability of a hip fracture is 2.6% within the next ten years. . Electronically Signed   By: Earle Gell M.D.   On: 05/20/2018 11:44     Assessment and plan- Patient is a 68 y.o. female with pathologically prognostic stage Ia invasive mammary carcinomaof the right breastpT1b pN0 cM0 ER PR positive HER-2/neu negative status post lumpectomyand adjuvant RT and currently on arimidex.  She is here for routine follow-up of breast cancer.  1.  Discussed results of bone density scan from January 2020 which are essentially stable as compared to 2 years ago but she still has significant osteopenia with a 10-year probability of a major osteoporotic fracture close to 17% and that of a major hip fracture at 2.6%.  Letrozole can further affect her bone health adversely.  We discussed starting bisphosphonates at this time Reclast 5 mg Q18 months based on recent NEJM trial from 2018 which showed reduction in skeletal related fractures at this dose.  She will get her first dose today and we  have already obtained dental clearance.  2.  Patient will continue Arimidex along with calcium and vitamin D.  Her recent mammogram from November 2019 was unremarkable.  I will see her back in 6 months no labs.    Visit Diagnosis 1. Osteopenia, unspecified location   2. Encounter for follow-up surveillance of breast cancer      Dr. Randa Evens, MD, MPH [facility] [phone] 05/24/2018 12:36 PM

## 2018-08-18 ENCOUNTER — Other Ambulatory Visit: Payer: Self-pay

## 2018-08-19 ENCOUNTER — Other Ambulatory Visit: Payer: Self-pay

## 2018-08-19 ENCOUNTER — Ambulatory Visit
Admission: RE | Admit: 2018-08-19 | Discharge: 2018-08-19 | Disposition: A | Discharge status: Home / Self Care | Payer: Medicare Other | Source: Ambulatory Visit | Attending: Radiation Oncology | Admitting: Radiation Oncology

## 2018-08-19 ENCOUNTER — Encounter: Payer: Self-pay | Admitting: Radiation Oncology

## 2018-08-19 VITALS — BP 132/89 | HR 97 | Temp 96.9°F | Wt 192.2 lb

## 2018-08-19 DIAGNOSIS — C50412 Malignant neoplasm of upper-outer quadrant of left female breast: Secondary | ICD-10-CM | POA: Diagnosis present

## 2018-08-19 DIAGNOSIS — Z923 Personal history of irradiation: Secondary | ICD-10-CM | POA: Insufficient documentation

## 2018-08-19 DIAGNOSIS — Z79811 Long term (current) use of aromatase inhibitors: Secondary | ICD-10-CM | POA: Insufficient documentation

## 2018-08-19 DIAGNOSIS — Z170 Estrogen receptor positive status [ER+]: Secondary | ICD-10-CM | POA: Insufficient documentation

## 2018-08-19 NOTE — Progress Notes (Signed)
Radiation Oncology Follow up Note  Name: April Leblanc   Date:   08/19/2018 MRN:  [id number] DOB: 12/02/1950    This 68 y.o. female presents to the clinic today for 1 year follow-up status post whole breast radiation to left breast for stage I ER positive invasive mammary carcinoma.  Patient is a 68 year old female now out 1 year having completed whole breast radiation to her left breast for stage I ER positive invasive mammary carcinoma status post wide local excision.  She is seen today in routine follow-up is doing well.  She is currently on arimadex tolerating that well without side effect.  She had mammograms back in December which I have reviewed were BI-RADS 2 benign.  REFERRING PROVIDER: Clarisse Gouge, MD  HPI: As above.  COMPLICATIONS OF TREATMENT: none  FOLLOW UP COMPLIANCE: keeps appointments   PHYSICAL EXAM:  BP 132/89 (BP Location: Left Arm, Patient Position: Sitting)   Pulse 97   Temp (!) 96.9 F (36.1 C) (Tympanic)   Wt 192 lb 3.9 oz (87.2 kg)   BMI 29.66 kg/m  Lungs are clear to A&P cardiac examination essentially unremarkable with regular rate and rhythm. No dominant mass or nodularity is noted in either breast in 2 positions examined. Incision is well-healed. No axillary or supraclavicular adenopathy is appreciated. Cosmetic result is excellent.  Well-developed well-nourished patient in NAD. HEENT reveals PERLA, EOMI, discs not visualized.  Oral cavity is clear. No oral mucosal lesions are identified. Neck is clear without evidence of cervical or supraclavicular adenopathy. Lungs are clear to A&P. Cardiac examination is essentially unremarkable with regular rate and rhythm without murmur rub or thrill. Abdomen is benign with no organomegaly or masses noted. Motor sensory and DTR levels are equal and symmetric in the upper and lower extremities. Cranial nerves II through XII are grossly intact. Proprioception is intact. No peripheral adenopathy or edema is identified.  No motor or sensory levels are noted. Crude visual fields are within normal range.  RADIOLOGY RESULTS: December mammograms reviewed and compatible with above-stated findings  PLAN: Present time patient is doing well with no evidence of disease.  She continues on arimadex without side effect.  I have asked to see her back in 1 year for follow-up.  Patient knows to call sooner with any concerns at any time.  I would like to take this opportunity to thank you for allowing me to participate in the care of your patient.Noreene Filbert, MD

## 2018-08-31 ENCOUNTER — Other Ambulatory Visit: Payer: Self-pay | Admitting: Oncology

## 2018-09-18 DIAGNOSIS — L301 Dyshidrosis [pompholyx]: Secondary | ICD-10-CM | POA: Insufficient documentation

## 2018-09-18 DIAGNOSIS — H43393 Other vitreous opacities, bilateral: Secondary | ICD-10-CM | POA: Insufficient documentation

## 2018-09-18 DIAGNOSIS — H10503 Unspecified blepharoconjunctivitis, bilateral: Secondary | ICD-10-CM | POA: Insufficient documentation

## 2018-09-18 DIAGNOSIS — N3281 Overactive bladder: Secondary | ICD-10-CM | POA: Insufficient documentation

## 2018-09-18 DIAGNOSIS — R7303 Prediabetes: Secondary | ICD-10-CM | POA: Insufficient documentation

## 2018-09-18 DIAGNOSIS — N362 Urethral caruncle: Secondary | ICD-10-CM | POA: Insufficient documentation

## 2018-09-18 DIAGNOSIS — R768 Other specified abnormal immunological findings in serum: Secondary | ICD-10-CM | POA: Insufficient documentation

## 2018-11-21 ENCOUNTER — Other Ambulatory Visit: Payer: Medicare Other

## 2018-11-21 ENCOUNTER — Ambulatory Visit: Payer: Medicare Other

## 2018-11-21 ENCOUNTER — Inpatient Hospital Stay: Payer: Medicare Other | Attending: Oncology | Admitting: Oncology

## 2018-11-21 ENCOUNTER — Other Ambulatory Visit: Payer: Self-pay

## 2018-11-21 ENCOUNTER — Encounter: Payer: Self-pay | Admitting: Oncology

## 2018-11-21 ENCOUNTER — Ambulatory Visit: Payer: Medicare Other | Admitting: Oncology

## 2018-11-21 VITALS — BP 133/85 | HR 84 | Temp 97.8°F | Resp 18 | Wt 192.6 lb

## 2018-11-21 DIAGNOSIS — E785 Hyperlipidemia, unspecified: Secondary | ICD-10-CM | POA: Diagnosis not present

## 2018-11-21 DIAGNOSIS — Z79811 Long term (current) use of aromatase inhibitors: Secondary | ICD-10-CM | POA: Diagnosis not present

## 2018-11-21 DIAGNOSIS — Z5181 Encounter for therapeutic drug level monitoring: Secondary | ICD-10-CM

## 2018-11-21 DIAGNOSIS — M8580 Other specified disorders of bone density and structure, unspecified site: Secondary | ICD-10-CM

## 2018-11-21 DIAGNOSIS — Z170 Estrogen receptor positive status [ER+]: Secondary | ICD-10-CM | POA: Diagnosis not present

## 2018-11-21 DIAGNOSIS — Z803 Family history of malignant neoplasm of breast: Secondary | ICD-10-CM | POA: Insufficient documentation

## 2018-11-21 DIAGNOSIS — M1990 Unspecified osteoarthritis, unspecified site: Secondary | ICD-10-CM | POA: Diagnosis not present

## 2018-11-21 DIAGNOSIS — Z79899 Other long term (current) drug therapy: Secondary | ICD-10-CM | POA: Diagnosis not present

## 2018-11-21 DIAGNOSIS — Z90710 Acquired absence of both cervix and uterus: Secondary | ICD-10-CM | POA: Diagnosis not present

## 2018-11-21 DIAGNOSIS — G4730 Sleep apnea, unspecified: Secondary | ICD-10-CM | POA: Diagnosis not present

## 2018-11-21 DIAGNOSIS — Z08 Encounter for follow-up examination after completed treatment for malignant neoplasm: Secondary | ICD-10-CM

## 2018-11-21 DIAGNOSIS — Z923 Personal history of irradiation: Secondary | ICD-10-CM | POA: Diagnosis not present

## 2018-11-21 DIAGNOSIS — C50412 Malignant neoplasm of upper-outer quadrant of left female breast: Secondary | ICD-10-CM | POA: Diagnosis not present

## 2018-11-21 DIAGNOSIS — K219 Gastro-esophageal reflux disease without esophagitis: Secondary | ICD-10-CM | POA: Insufficient documentation

## 2018-11-21 DIAGNOSIS — E039 Hypothyroidism, unspecified: Secondary | ICD-10-CM | POA: Insufficient documentation

## 2018-11-21 DIAGNOSIS — Z853 Personal history of malignant neoplasm of breast: Secondary | ICD-10-CM

## 2018-11-21 NOTE — Progress Notes (Signed)
Patient is here for follow up, she is doing well no major complaints  

## 2018-11-25 ENCOUNTER — Other Ambulatory Visit: Payer: Self-pay | Admitting: Oncology

## 2018-11-25 NOTE — Progress Notes (Signed)
Hematology/Oncology Consult note 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 Mammogram showed possible asymmetry in the right breast and possible mass and distortion in the left breast. Diagnostic bilateral mammogram showed suspicious mass in the left breast at 10 o'clock position 6 x 4 x 5 mm in size. No evidence of left axillary adenopathy. Asymmetry seen in the right breast resolved on Tomosyntesis images and consistent with overlapping fibroglandular tissue.  Patient underwent ultrasound-guided core biopsy of the left breast lesion which showed invasive mammary carcinoma, grade 1. Greater than 90% ER positive, 1% PR positive and HER-2 equivocal by IHC. FISH testing for HER-2 is currently pending  She is G2 P2 L2. Remote use of hormone contraception. Menarche at the age of 71. She had hysterectomy in her 62s. No prior abnormal breast mammograms or breast biopsies. Family history significant for prostate cancer in her brother. Breast cancer in maternal aunt and 2 maternal first cousins. She does not know if they have been tested for breast cancer  Patient underwent lumpectomy with sentinel lymph node biopsy on 04/13/2017. 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 Margins were negative for invasive and in situ carcinoma. 0 out of 1 sentinel lymph node positive for  malignancy. ER greater than 90% positive PR 1% positive HER-2 FISH negative.Margins were 2 mm for invasive carcinoma and DCIS 0.5 mm  Bone density scan in September 2016 showed osteopenia with a T score of -2.1 at the AP spine. 10-year probability of a major osteoporotic fracture was 16% and major hip fracture was 2% back then  oncotype testing was not recommended for a 23m grade 1 tumor and hence she did not require adjuvant chemotherapy. She hascompleted adjuavnt RT and started taking arimidex in March 2019  Interval history-overall she is feeling well and denies any complaints on Arimidex calcium and vitamin D.  ECOG PS- 1 Pain scale- 0   Review of systems- Review of Systems  Constitutional: Negative for chills, fever, malaise/fatigue and weight loss.  HENT: Negative for congestion, ear discharge and nosebleeds.   Eyes: Negative for blurred vision.  Respiratory: Negative for cough, hemoptysis, sputum production, shortness of breath and wheezing.   Cardiovascular: Negative for chest pain, palpitations, orthopnea and claudication.  Gastrointestinal: Negative for abdominal pain, blood in stool, constipation, diarrhea, heartburn, melena, nausea and vomiting.  Genitourinary: Negative for dysuria, flank pain, frequency, hematuria and urgency.  Musculoskeletal: Negative for back pain, joint pain and myalgias.  Skin: Negative for rash.  Neurological: Negative for dizziness, tingling, focal weakness, seizures, weakness and headaches.  Endo/Heme/Allergies: Does not bruise/bleed easily.  Psychiatric/Behavioral: Negative for depression and suicidal ideas. The patient does not have insomnia.       Allergies  Allergen Reactions  . Amoxicillin Hives, Itching and Other (See Comments)    Has patient had a PCN reaction causing immediate rash, facial/tongue/throat swelling, SOB or lightheadedness with hypotension: No Has patient had a PCN reaction causing severe rash involving  mucus  membranes or skin necrosis: No Has patient had a PCN reaction that required hospitalization: No Has patient had a PCN reaction occurring within the last 10 years: No If all of the above answers are "NO", then may proceed with Cephalosporin use.   . Erythromycin Hives and Itching  . Sulfa Antibiotics Hives and Itching  . Iodinated Diagnostic Agents Rash    Red rash and itching. Topical iodine not a problem.  . Morphine Nausea And Vomiting and Other (See Comments)    Pretty severe vomiting. Can take hydrocodone and oxycodone       Past Medical History:  Diagnosis Date  . Anemia   . Ankle sprain 03/2017  . Arthritis   . Breast cancer ([place]) 2018  . Breast cancer ([place]) 03/2017  . Depression   . Fracture of distal end of radius 03/2017   left  . Genetic testing 05/04/2017   Multi-Cancer panel (83 genes) @ [facility] - No pathogenic mutations detected  . GERD (gastroesophageal reflux disease)   . Headache   . Hyperlipidemia   . Hypothyroidism   . Personal history of radiation therapy   . Pre-diabetes 03/2017   follows healthy diabetic diet  . Sleep apnea    has not used cpap in a long time  . Vitamin B 12 deficiency      Past Surgical History:  Procedure Laterality Date  . ABDOMINAL HYSTERECTOMY  1983  . APPENDECTOMY  1977   when she had c-section  . BREAST BIOPSY Left 03/27/2017   INVASIVE MAMMARY CARCINOMA Grade 1 UIG  . BREAST LUMPECTOMY Left 04/13/2017   INVASIVE MAMMARY CARCINOMA/ DCIS, CLEAR MARGINS, NEG. LN  . CESAREAN SECTION    . COLONOSCOPY    . ESOPHAGOGASTRODUODENOSCOPY (EGD) WITH PROPOFOL N/A 02/05/2015   Procedure: ESOPHAGOGASTRODUODENOSCOPY (EGD) WITH PROPOFOL;  Surgeon: Josefine Class, MD;  Location: [facility] ENDOSCOPY;  Service: Endoscopy;  Laterality: N/A;  . PARTIAL MASTECTOMY WITH NEEDLE LOCALIZATION Left 04/13/2017   Procedure: PARTIAL MASTECTOMY WITH NEEDLE LOCALIZATION;  Surgeon: Leonie Green, MD;  Location: [facility] ORS;  Service: General;   Laterality: Left;  . PATELLA FRACTURE SURGERY Left 1991   screws in place  . SAVORY DILATION N/A 02/05/2015   Procedure: SAVORY DILATION;  Surgeon: Josefine Class, MD;  Location[facility] ENDOSCOPY;  Service: Endoscopy;  Laterality: N/A;  . SENTINEL NODE BIOPSY Left 04/13/2017   Procedure: SENTINEL NODE BIOPSY;  Surgeon: Leonie Green, MD;  Location: [facility] ORS;  Service: General;  Laterality: Left;  . SINUS SURGERY WITH INSTATRAK      Social History   Socioeconomic History  . Marital status: Married    Spouse name: Not on file  . Number of children: Not on file  . Years of education: Not on file  . Highest education level: Not on file  Occupational History  . Not on file  Social Needs  . Financial resource strain: Not on file  . Food insecurity    Worry: Not on file    Inability: Not on file  . Transportation needs    Medical: Not on file    Non-medical: Not on file  Tobacco Use  . Smoking status: Never Smoker  . Smokeless tobacco: Never Used  Substance and Sexual Activity  . Alcohol use: No    Frequency: Never  . Drug use: No  . Sexual activity: Not on file  Lifestyle  . Physical activity    Days per week: Not on file    Minutes per  session: Not on file  . Stress: Not on file  Relationships  . Social Herbalist on phone: Not on file    Gets together: Not on file    Attends religious service: Not on file    Active member of club or organization: Not on file    Attends meetings of clubs or organizations: Not on file    Relationship status: Not on file  . Intimate partner violence    Fear of current or ex partner: Not on file    Emotionally abused: Not on file    Physically abused: Not on file    Forced sexual activity: Not on file  Other Topics Concern  . Not on file  Social History Narrative  . Not on file    Family History  Problem Relation Age of Onset  . Breast cancer Maternal Aunt 70       deceased 59s  . Breast cancer Cousin 30        daughter of mat aunt with breast cancer  . Breast cancer Cousin 35       daughter of mat aunt with breast cancer  . Deep vein thrombosis Mother   . Heart disease Mother   . Stroke Mother   . Heart disease Father   . Parkinson's disease Father   . Heart disease Sister   . Stroke Sister   . Skin cancer Brother   . Heart disease Brother   . Alzheimer's disease Brother   . Lung cancer Paternal Uncle        deceased 67s  . Heart disease Brother   . Alzheimer's disease Brother   . Heart disease Brother   . Alzheimer's disease Brother   . Cancer Brother        unclear primary; currently 5s  . Cirrhosis Sister   . Alzheimer's disease Sister   . Kidney cancer Son 37       nephrectomy @ [facility]     Current Outpatient Medications:  .  acetaminophen (TYLENOL) 500 MG tablet, Take 1,000 mg by mouth every 6 (six) hours as needed for moderate pain or headache., Disp: , Rfl:  .  amphetamine-dextroamphetamine (ADDERALL XR) 10 MG 24 hr capsule, Take 10 mg by mouth daily., Disp: , Rfl:  .  ARIPiprazole (ABILIFY) 10 MG tablet, Take 10 mg by mouth daily., Disp: , Rfl:  .  atorvastatin (LIPITOR) 40 MG tablet, Take 40 mg by mouth daily. , Disp: , Rfl:  .  darifenacin (ENABLEX) 15 MG 24 hr tablet, Take 15 mg by mouth daily., Disp: , Rfl:  .  esomeprazole (NEXIUM) 40 MG capsule, Take 40 mg by mouth daily. , Disp: , Rfl:  .  Estradiol 10 MCG TABS vaginal tablet, Place vaginally., Disp: , Rfl:  .  fluticasone (FLONASE) 50 MCG/ACT nasal spray, Place 2 sprays into both nostrils daily as needed for allergies. , Disp: , Rfl:  .  levocetirizine (XYZAL) 5 MG tablet, every evening., Disp: , Rfl:  .  levothyroxine (SYNTHROID, LEVOTHROID) 150 MCG tablet, Take 150 mcg by mouth daily before breakfast., Disp: , Rfl:  .  LORazepam (ATIVAN) 0.5 MG tablet, Take 0.5 mg by mouth 3 (three) times daily. , Disp: , Rfl:  .  montelukast (SINGULAIR) 10 MG tablet, Take 10 mg by mouth daily. , Disp: , Rfl:  .  ranitidine  (ZANTAC) 300 MG capsule, Take 300 mg by mouth at bedtime. , Disp: , Rfl:  .  traZODone (DESYREL) 100  MG tablet, Take 100 mg by mouth at bedtime. Take  1 and 1/2 tablet for a total of 133m daily, Disp: , Rfl:  .  vitamin B-12 (CYANOCOBALAMIN) 1000 MCG tablet, Take 1,000 mcg by mouth daily., Disp: , Rfl:  .  Vitamin D, Ergocalciferol, (DRISDOL) 50000 units CAPS capsule, Take 50,000 Units by mouth every 7 (seven) days., Disp: , Rfl:  .  Vortioxetine HBr (TRINTELLIX) 20 MG TABS, Take 20 mg by mouth daily., Disp: , Rfl:  .  anastrozole (ARIMIDEX) 1 MG tablet, TAKE 1 TABLET BY MOUTH EVERY DAY, Disp: 90 tablet, Rfl: 0  Physical exam:  Vitals:   11/21/18 1434  BP: 133/85  Pulse: 84  Resp: 18  Temp: 97.8 F (36.6 C)  TempSrc: Tympanic  Weight: 192 lb 9.6 oz (87.4 kg)   Physical Exam Constitutional:      General: She is not in acute distress. HENT:     Head: Normocephalic and atraumatic.  Eyes:     Pupils: Pupils are equal, round, and reactive to light.  Neck:     Musculoskeletal: Normal range of motion.  Cardiovascular:     Rate and Rhythm: Normal rate and regular rhythm.     Heart sounds: Normal heart sounds.  Pulmonary:     Effort: Pulmonary effort is normal.     Breath sounds: Normal breath sounds.  Abdominal:     General: Bowel sounds are normal.     Palpations: Abdomen is soft.  Skin:    General: Skin is warm and dry.  Neurological:     Mental Status: She is alert and oriented to person, place, and time.     Breast exam was performed in seated and lying down position. Patient is status post left lumpectomy with a well-healed surgical scar. No evidence of any palpable masses. No evidence of axillary adenopathy. No evidence of any palpable masses or lumps in the right breast. No evidence of right axillary adenopathy    CMP Latest Ref Rng & Units 05/23/2018  Glucose 70 - 99 mg/dL 102(H)  BUN 8 - 23 mg/dL 19  Creatinine 0.44 - 1.00 mg/dL 0.69  Sodium 135 - 145 mmol/L 139   Potassium 3.5 - 5.1 mmol/L 4.0  Chloride 98 - 111 mmol/L 104  CO2 22 - 32 mmol/L 26  Calcium 8.9 - 10.3 mg/dL 9.4  Total Protein 6.5 - 8.1 g/dL 7.5  Total Bilirubin 0.3 - 1.2 mg/dL 0.4  Alkaline Phos 38 - 126 U/L 161(H)  AST 15 - 41 U/L 23  ALT 0 - 44 U/L 28   CBC Latest Ref Rng & Units 05/23/2018  WBC 4.0 - 10.5 K/uL 8.9  Hemoglobin 12.0 - 15.0 g/dL 12.7  Hematocrit 36.0 - 46.0 % 39.0  Platelets 150 - 400 K/uL 249      Assessment and plan- Patient is a 68y.o. female with pathologically prognostic stage Ia invasive mammary carcinomaof the right breastpT1b pN0 cM0 ER PR positive HER-2/neu negative status post lumpectomyand adjuvant RT and currently on arimidex.  She is here for routine follow-up of breast cancer  1.  Clinically patient is doing well and there are no concerning signs and symptoms of recurrence on today's exam.  Patient is due for a repeat mammogram in November 2020 which will be coordinated by Dr. SLysle Pearl  Overall patient is tolerating Arimidex along with calcium and vitamin D well without any significant side effects.  2.  Patient does have significant osteopenia and she will be receiving bisphosphonates every  18 months and will be due for her next bisphosphonate dose in July 2021.  I will see her back in 6 months.  No labs   Visit Diagnosis 1. Encounter for follow-up surveillance of breast cancer   2. Visit for monitoring Arimidex therapy      Dr. Randa Evens, MD, MPH [facility] [phone] 11/25/2018 10:26 AM

## 2019-02-04 ENCOUNTER — Other Ambulatory Visit: Payer: Self-pay | Admitting: General Surgery

## 2019-02-04 DIAGNOSIS — Z853 Personal history of malignant neoplasm of breast: Secondary | ICD-10-CM

## 2019-02-19 ENCOUNTER — Other Ambulatory Visit: Payer: Self-pay | Admitting: Oncology

## 2019-04-01 ENCOUNTER — Ambulatory Visit
Admission: RE | Admit: 2019-04-01 | Discharge: 2019-04-01 | Disposition: A | Discharge status: Home / Self Care | Payer: Medicare Other | Source: Ambulatory Visit | Attending: General Surgery | Admitting: General Surgery

## 2019-04-01 DIAGNOSIS — Z853 Personal history of malignant neoplasm of breast: Secondary | ICD-10-CM

## 2019-04-17 ENCOUNTER — Encounter: Payer: Self-pay | Admitting: Oncology

## 2019-04-17 ENCOUNTER — Other Ambulatory Visit: Payer: Self-pay

## 2019-04-17 DIAGNOSIS — F329 Major depressive disorder, single episode, unspecified: Secondary | ICD-10-CM | POA: Insufficient documentation

## 2019-04-17 DIAGNOSIS — F32A Depression, unspecified: Secondary | ICD-10-CM | POA: Insufficient documentation

## 2019-04-17 DIAGNOSIS — G43909 Migraine, unspecified, not intractable, without status migrainosus: Secondary | ICD-10-CM | POA: Insufficient documentation

## 2019-04-17 NOTE — Progress Notes (Signed)
Patient stated that she had been doing well with no concerns. Patient's last mammogram was done on 04/01/2019 and it was normal.

## 2019-04-18 ENCOUNTER — Inpatient Hospital Stay: Payer: Medicare Other | Attending: Oncology | Admitting: Oncology

## 2019-04-18 ENCOUNTER — Other Ambulatory Visit: Payer: Self-pay

## 2019-04-18 VITALS — BP 134/79 | HR 86 | Temp 98.2°F | Resp 16 | Wt 196.1 lb

## 2019-04-18 DIAGNOSIS — Z853 Personal history of malignant neoplasm of breast: Secondary | ICD-10-CM

## 2019-04-18 DIAGNOSIS — Z79899 Other long term (current) drug therapy: Secondary | ICD-10-CM | POA: Insufficient documentation

## 2019-04-18 DIAGNOSIS — Z803 Family history of malignant neoplasm of breast: Secondary | ICD-10-CM | POA: Insufficient documentation

## 2019-04-18 DIAGNOSIS — M8580 Other specified disorders of bone density and structure, unspecified site: Secondary | ICD-10-CM | POA: Diagnosis not present

## 2019-04-18 DIAGNOSIS — Z923 Personal history of irradiation: Secondary | ICD-10-CM | POA: Insufficient documentation

## 2019-04-18 DIAGNOSIS — M1990 Unspecified osteoarthritis, unspecified site: Secondary | ICD-10-CM | POA: Diagnosis not present

## 2019-04-18 DIAGNOSIS — C50412 Malignant neoplasm of upper-outer quadrant of left female breast: Secondary | ICD-10-CM | POA: Insufficient documentation

## 2019-04-18 DIAGNOSIS — Z79811 Long term (current) use of aromatase inhibitors: Secondary | ICD-10-CM | POA: Diagnosis not present

## 2019-04-18 DIAGNOSIS — F329 Major depressive disorder, single episode, unspecified: Secondary | ICD-10-CM | POA: Diagnosis not present

## 2019-04-18 DIAGNOSIS — E785 Hyperlipidemia, unspecified: Secondary | ICD-10-CM | POA: Insufficient documentation

## 2019-04-18 DIAGNOSIS — R232 Flushing: Secondary | ICD-10-CM | POA: Insufficient documentation

## 2019-04-18 DIAGNOSIS — Z5181 Encounter for therapeutic drug level monitoring: Secondary | ICD-10-CM

## 2019-04-18 DIAGNOSIS — K219 Gastro-esophageal reflux disease without esophagitis: Secondary | ICD-10-CM | POA: Insufficient documentation

## 2019-04-18 DIAGNOSIS — E039 Hypothyroidism, unspecified: Secondary | ICD-10-CM | POA: Insufficient documentation

## 2019-04-18 DIAGNOSIS — Z170 Estrogen receptor positive status [ER+]: Secondary | ICD-10-CM | POA: Diagnosis not present

## 2019-04-18 DIAGNOSIS — Z08 Encounter for follow-up examination after completed treatment for malignant neoplasm: Secondary | ICD-10-CM

## 2019-04-23 NOTE — Progress Notes (Signed)
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  6 mm tumor with negative lymph nodes that was ER/PR positive and negative.  Patient did not adjuvant chemotherapy and went on to receive adjuvant radiation treatment and started taking Arimidex in march 2019  Interval history- Patient is doing well on arimidex. She has baseline arthritis and arthralgias which is relatively well controlled. Mild self limited hot flashes  ECOG PS- 1 Pain scale- 0  Review of systems- Review of Systems  Constitutional: Negative for chills, fever, malaise/fatigue and weight loss.  HENT: Negative for congestion, ear discharge and nosebleeds.   Eyes: Negative for blurred vision.  Respiratory: Negative for cough, hemoptysis, sputum production, shortness of breath and wheezing.   Cardiovascular: Negative for chest pain, palpitations, orthopnea and claudication.  Gastrointestinal: Negative for abdominal pain, blood in stool, constipation, diarrhea, heartburn, melena, nausea and vomiting.  Genitourinary: Negative for dysuria, flank pain, frequency, hematuria and urgency.  Musculoskeletal: Negative for back pain, joint pain and myalgias.  Skin: Negative for rash.  Neurological: Negative for dizziness, tingling, focal weakness, seizures, weakness and headaches.  Endo/Heme/Allergies: Does not bruise/bleed easily.  Psychiatric/Behavioral:  Negative for depression and suicidal ideas. The patient does not have insomnia.       Allergies  Allergen Reactions  . Amoxicillin Hives, Itching and Other (See Comments)    Has patient had a PCN reaction causing immediate rash, facial/tongue/throat swelling, SOB or lightheadedness with hypotension: No Has patient had a PCN reaction causing severe rash involving mucus membranes or skin necrosis: No Has patient had a PCN reaction that required hospitalization: No Has patient had a PCN reaction occurring within the last 10 years: No If all of the above answers are "NO", then may proceed with Cephalosporin use.   . Erythromycin Hives and Itching  . Sulfa Antibiotics Hives and Itching  . Iodinated Diagnostic Agents Rash    Red rash and itching. Topical iodine not a problem.  . Morphine Nausea And Vomiting and Other (See Comments)    Pretty severe vomiting. Can take hydrocodone and oxycodone       Past Medical History:  Diagnosis Date  . Anemia   . Ankle sprain 03/2017  . Arthritis   . Breast cancer ([place]) 2018  . Breast cancer ([place]) 03/2017  . Depression   . Fracture of distal end of radius 03/2017   left  . Genetic testing 05/04/2017   Multi-Cancer panel (83 genes) @ [facility] - No pathogenic mutations detected  . GERD (gastroesophageal reflux disease)   . Headache   . Hyperlipidemia   . Hypothyroidism   . Personal history of radiation therapy   . Pre-diabetes 03/2017   follows healthy diabetic diet  . Sleep apnea    has not used cpap in a long time  . Vitamin B 12 deficiency      Past Surgical History:  Procedure Laterality Date  . ABDOMINAL HYSTERECTOMY  1983  . APPENDECTOMY  1977   when she had c-section  . BREAST BIOPSY Left 03/27/2017   INVASIVE MAMMARY CARCINOMA Grade 1 UIG  . BREAST LUMPECTOMY Left 04/13/2017   INVASIVE MAMMARY CARCINOMA/ DCIS, CLEAR MARGINS, NEG. LN  . CESAREAN SECTION    . COLONOSCOPY    . ESOPHAGOGASTRODUODENOSCOPY (EGD) WITH PROPOFOL  N/A 02/05/2015   Procedure: ESOPHAGOGASTRODUODENOSCOPY (EGD) WITH PROPOFOL;  Surgeon: Josefine Class, MD;  Location[facility] ENDOSCOPY;  Service: Endoscopy;  Laterality: N/A;  . PARTIAL MASTECTOMY WITH NEEDLE LOCALIZATION Left 04/13/2017   Procedure: PARTIAL MASTECTOMY WITH NEEDLE LOCALIZATION;  Surgeon: Leonie Green, MD;  Location: [facility] ORS;  Service: General;  Laterality: Left;  . PATELLA FRACTURE SURGERY Left 1991   screws in place  . SAVORY DILATION N/A 02/05/2015   Procedure: SAVORY DILATION;  Surgeon: Josefine Class, MD;  Location: [facility] ENDOSCOPY;  Service: Endoscopy;  Laterality: N/A;  . SENTINEL NODE BIOPSY Left 04/13/2017   Procedure: SENTINEL NODE BIOPSY;  Surgeon: Leonie Green, MD;  Location: [facility] ORS;  Service: General;  Laterality: Left;  . SINUS SURGERY WITH INSTATRAK      Social History   Socioeconomic History  . Marital status: Married    Spouse name: Not on file  . Number of children: Not on file  . Years of education: Not on file  . Highest education level: Not on file  Occupational History  . Not on file  Tobacco Use  . Smoking status: Never Smoker  . Smokeless tobacco: Never Used  Substance and Sexual Activity  . Alcohol use: No  . Drug use: No  . Sexual activity: Not on file  Other Topics Concern  . Not on file  Social History Narrative  . Not on file   Social Determinants of Health   Financial Resource Strain:   . Difficulty of Paying Living Expenses: Not on file  Food Insecurity:   . Worried About Charity fundraiser in the Last Year: Not on file  . Ran Out of Food in the Last Year: Not on file  Transportation Needs:   . Lack of Transportation (Medical): Not on file  . Lack of Transportation (Non-Medical): Not on file  Physical Activity:   . Days of Exercise per Week: Not on file  . Minutes of Exercise per Session: Not on file  Stress:   . Feeling of Stress : Not on file  Social Connections:   . Frequency of Communication  with Friends and Family: Not on file  . Frequency of Social Gatherings with Friends and Family: Not on file  . Attends Religious Services: Not on file  . Active Member of Clubs or Organizations: Not on file  . Attends Archivist Meetings: Not on file  . Marital Status: Not on file  Intimate Partner Violence:   . Fear of Current or Ex-Partner: Not on file  . Emotionally Abused: Not on file  . Physically Abused: Not on file  . Sexually Abused: Not on file    Family History  Problem Relation Age of Onset  . Breast cancer Maternal Aunt 70       deceased 45s  . Breast cancer Cousin 38       daughter of mat aunt with breast cancer  . Breast cancer Cousin 46       daughter of mat aunt with breast cancer  . Deep vein thrombosis Mother   . Heart disease Mother   . Stroke Mother   . Heart disease Father   .  Parkinson's disease Father   . Heart disease Sister   . Stroke Sister   . Skin cancer Brother   . Heart disease Brother   . Alzheimer's disease Brother   . Lung cancer Paternal Uncle        deceased 74s  . Heart disease Brother   . Alzheimer's disease Brother   . Heart disease Brother   . Alzheimer's disease Brother   . Cancer Brother        unclear primary; currently 3s  . Cirrhosis Sister   . Alzheimer's disease Sister   . Kidney cancer Son 47       nephrectomy @ [facility]     Current Outpatient Medications:  .  amphetamine-dextroamphetamine (ADDERALL XR) 10 MG 24 hr capsule, Take 10 mg by mouth daily., Disp: , Rfl:  .  anastrozole (ARIMIDEX) 1 MG tablet, TAKE 1 TABLET BY MOUTH EVERY DAY, Disp: 90 tablet, Rfl: 0 .  ARIPiprazole (ABILIFY) 10 MG tablet, Take 10 mg by mouth daily., Disp: , Rfl:  .  atorvastatin (LIPITOR) 40 MG tablet, Take 40 mg by mouth daily. , Disp: , Rfl:  .  darifenacin (ENABLEX) 15 MG 24 hr tablet, Take 15 mg by mouth daily., Disp: , Rfl:  .  esomeprazole (NEXIUM) 40 MG capsule, Take 40 mg by mouth daily. , Disp: , Rfl:  .  fluticasone  (FLONASE) 50 MCG/ACT nasal spray, Place 2 sprays into both nostrils daily as needed for allergies. , Disp: , Rfl:  .  levocetirizine (XYZAL) 5 MG tablet, every evening., Disp: , Rfl:  .  levothyroxine (SYNTHROID, LEVOTHROID) 150 MCG tablet, Take 150 mcg by mouth daily before breakfast., Disp: , Rfl:  .  LORazepam (ATIVAN) 0.5 MG tablet, Take 0.5 mg by mouth 3 (three) times daily. , Disp: , Rfl:  .  traZODone (DESYREL) 100 MG tablet, Take 100 mg by mouth at bedtime. Take  1 and 1/2 tablet for a total of 140m daily, Disp: , Rfl:  .  vitamin B-12 (CYANOCOBALAMIN) 1000 MCG tablet, Take 1,000 mcg by mouth daily., Disp: , Rfl:  .  Vitamin D, Ergocalciferol, (DRISDOL) 50000 units CAPS capsule, Take 50,000 Units by mouth every 7 (seven) days., Disp: , Rfl:  .  Vortioxetine HBr (TRINTELLIX) 20 MG TABS, Take 20 mg by mouth daily., Disp: , Rfl:  .  acetaminophen (TYLENOL) 500 MG tablet, Take 1,000 mg by mouth every 6 (six) hours as needed for moderate pain or headache., Disp: , Rfl:  .  Estradiol 10 MCG TABS vaginal tablet, Place vaginally as needed. , Disp: , Rfl:   Physical exam:  Vitals:   04/18/19 1022  BP: 134/79  Pulse: 86  Resp: 16  Temp: 98.2 F (36.8 C)  TempSrc: Tympanic  SpO2: 96%  Weight: 196 lb 1.6 oz (89 kg)   Physical Exam Constitutional:      General: She is not in acute distress. HENT:     Head: Normocephalic and atraumatic.  Eyes:     Pupils: Pupils are equal, round, and reactive to light.  Cardiovascular:     Rate and Rhythm: Normal rate and regular rhythm.     Heart sounds: Normal heart sounds.  Pulmonary:     Effort: Pulmonary effort is normal.     Breath sounds: Normal breath sounds.  Abdominal:     General: Bowel sounds are normal.     Palpations: Abdomen is soft.  Musculoskeletal:     Cervical back: Normal range of motion.  Skin:  General: Skin is warm and dry.  Neurological:     Mental Status: She is alert and oriented to person, place, and time.       CMP Latest Ref Rng & Units 05/23/2018  Glucose 70 - 99 mg/dL 102(H)  BUN 8 - 23 mg/dL 19  Creatinine 0.44 - 1.00 mg/dL 0.69  Sodium 135 - 145 mmol/L 139  Potassium 3.5 - 5.1 mmol/L 4.0  Chloride 98 - 111 mmol/L 104  CO2 22 - 32 mmol/L 26  Calcium 8.9 - 10.3 mg/dL 9.4  Total Protein 6.5 - 8.1 g/dL 7.5  Total Bilirubin 0.3 - 1.2 mg/dL 0.4  Alkaline Phos 38 - 126 U/L 161(H)  AST 15 - 41 U/L 23  ALT 0 - 44 U/L 28   CBC Latest Ref Rng & Units 05/23/2018  WBC 4.0 - 10.5 K/uL 8.9  Hemoglobin 12.0 - 15.0 g/dL 12.7  Hematocrit 36.0 - 46.0 % 39.0  Platelets 150 - 400 K/uL 249    No images are attached to the encounter.  MM DIAG BREAST TOMO BILATERAL  Result Date: 04/01/2019 CLINICAL DATA:  68 year old female with history of left breast cancer post lumpectomy two thousand eighteen. EXAM: DIGITAL DIAGNOSTIC BILATERAL MAMMOGRAM WITH CAD AND TOMO COMPARISON:  Previous exam(s). ACR Breast Density Category c: The breast tissue is heterogeneously dense, which may obscure small masses. FINDINGS: No suspicious masses or calcifications are seen in either breast. An initially questioned asymmetry in the right breast resolves on the additional imaging with findings compatible with an area of overlapping fibroglandular tissue. Lumpectomy changes again identified in the upper posterior left breast. Spot compression magnification MLO view of the left breast lumpectomy site was performed. There is no mammographic evidence of locally recurrent malignancy. Mammographic images were processed with CAD. IMPRESSION: No mammographic evidence of malignancy in either breast. RECOMMENDATION: Diagnostic mammogram is suggested in 1 year. (Code:DM-B-01Y) I have discussed the findings and recommendations with the patient. If applicable, a reminder letter will be sent to the patient regarding the next appointment. BI-RADS CATEGORY  2: Benign. Electronically Signed   By: Everlean Alstrom M.D.   On: 04/01/2019 11:51      Assessment and plan- Patient is a 68 y.o. female with pathologically prognostic stage Ia invasive mammary carcinomaof the right breastpT1b pN0 cM0 ER PR positive HER-2/neu negative status post lumpectomyand adjuvant RT and currently on arimidex. This is a routine f/u visit  Overall Patient is doing well on arimidex ca and vit D. She will continue that for 4 more years. She has been on zometa Q18 months for osteopenia. Next dose in June 2020  Recent mammogram from 2 weeks ago showed no evidence of malignancy. I will see her back in 6 months for routine breast exam.    Visit Diagnosis 1. Encounter for follow-up surveillance of breast cancer   2. Visit for monitoring Arimidex therapy      Dr. Randa Evens, MD, MPH [facility] [phone] 04/23/2019 2:18 PM

## 2019-05-14 ENCOUNTER — Other Ambulatory Visit: Payer: Self-pay | Admitting: Oncology

## 2019-06-16 ENCOUNTER — Other Ambulatory Visit: Payer: Self-pay

## 2019-06-16 ENCOUNTER — Emergency Department: Payer: Medicare Other

## 2019-06-16 ENCOUNTER — Emergency Department
Admission: EM | Admit: 2019-06-16 | Discharge: 2019-06-16 | Disposition: A | Discharge status: Home / Self Care | Payer: Medicare Other | Attending: Emergency Medicine | Admitting: Emergency Medicine

## 2019-06-16 ENCOUNTER — Encounter: Payer: Self-pay | Admitting: *Deleted

## 2019-06-16 DIAGNOSIS — S0990XA Unspecified injury of head, initial encounter: Secondary | ICD-10-CM | POA: Diagnosis not present

## 2019-06-16 DIAGNOSIS — G44311 Acute post-traumatic headache, intractable: Secondary | ICD-10-CM

## 2019-06-16 DIAGNOSIS — Y939 Activity, unspecified: Secondary | ICD-10-CM | POA: Diagnosis not present

## 2019-06-16 DIAGNOSIS — Y999 Unspecified external cause status: Secondary | ICD-10-CM | POA: Insufficient documentation

## 2019-06-16 DIAGNOSIS — W01198A Fall on same level from slipping, tripping and stumbling with subsequent striking against other object, initial encounter: Secondary | ICD-10-CM | POA: Insufficient documentation

## 2019-06-16 DIAGNOSIS — Z79899 Other long term (current) drug therapy: Secondary | ICD-10-CM | POA: Diagnosis not present

## 2019-06-16 DIAGNOSIS — R519 Headache, unspecified: Secondary | ICD-10-CM | POA: Diagnosis not present

## 2019-06-16 DIAGNOSIS — Y929 Unspecified place or not applicable: Secondary | ICD-10-CM | POA: Insufficient documentation

## 2019-06-16 DIAGNOSIS — E039 Hypothyroidism, unspecified: Secondary | ICD-10-CM | POA: Diagnosis not present

## 2019-06-16 MED ORDER — NAPROXEN 500 MG PO TABS
500.0000 mg | ORAL_TABLET | Freq: Once | ORAL | Status: AC
  Administered 2019-06-16: 500 mg via ORAL
  Filled 2019-06-16: qty 1

## 2019-06-16 NOTE — ED Notes (Signed)
Signature pad unresponsive

## 2019-06-16 NOTE — ED Provider Notes (Signed)
[facility] Emergency Department Provider Note ____________________________________________  Time seen: Approximately 9:58 PM  I have reviewed the triage vital signs and the nursing notes.   HISTORY  Chief Complaint Fall   HPI April Leblanc is a 69 y.o. female presents to the emergency department for evaluation of headache after sustaining a mechanical, nonsyncopal fall at home.  She was putting some clothes in closet and turned around to leave the room and tripped over a toy.  She struck her head on the hardwood floor.  She denies loss of consciousness.  No vomiting.  No pain in the neck or back.  She is not currently on any type of blood thinner.   Location: Diffuse Similar to previous headaches: No Duration: Constant TIMING: Prior to arrival SEVERITY: 5/10 QUALITY: Throbbing CONTEXT: Post fall MODIFYING FACTORS: None ASSOCIATED SYMPTOMS: None Past Medical History:  Diagnosis Date  . Anemia   . Ankle sprain 03/2017  . Arthritis   . Breast cancer ([place]) 2018  . Breast cancer ([place]) 03/2017  . Depression   . Fracture of distal end of radius 03/2017   left  . Genetic testing 05/04/2017   Multi-Cancer panel (83 genes) @ [facility] - No pathogenic mutations detected  . GERD (gastroesophageal reflux disease)   . Headache   . Hyperlipidemia   . Hypothyroidism   . Personal history of radiation therapy   . Pre-diabetes 03/2017   follows healthy diabetic diet  . Sleep apnea    has not used cpap in a long time  . Vitamin B 12 deficiency     Patient Active Problem List   Diagnosis Date Noted  . Depression 04/17/2019  . Migraine headache 04/17/2019  . Blepharoconjunctivitis of both eyes 09/18/2018  . Dyshidrotic eczema 09/18/2018  . Floaters, bilateral 09/18/2018  . OAB (overactive bladder) 09/18/2018  . Positive ANA (antinuclear antibody) 09/18/2018  . Prediabetes 09/18/2018  . Urethral caruncle 09/18/2018  . Osteopenia 10/15/2017  . Genetic  testing 05/04/2017  . Malignant neoplasm of upper-outer quadrant of left breast in female, estrogen receptor positive ([place]) 04/02/2017  . Goals of care, counseling/discussion 04/02/2017  . Mixed incontinence urge and stress 02/05/2016  . Weakness 06/30/2015  . Adhesive capsulitis of left shoulder 08/26/2014  . Tendinitis of both rotator cuffs 08/26/2014  . Vitamin B12 deficiency 08/25/2014  . Bilateral shoulder pain 07/17/2014  . Aphasia 10/06/2013  . Gait disturbance 10/06/2013  . Memory loss 10/06/2013  . GERD (gastroesophageal reflux disease) 12/19/2011  . Hypercholesterolemia 12/19/2011  . Hypothyroidism 12/19/2011  . Sleep apnea 12/19/2011    Past Surgical History:  Procedure Laterality Date  . ABDOMINAL HYSTERECTOMY  1983  . APPENDECTOMY  1977   when she had c-section  . BREAST BIOPSY Left 03/27/2017   INVASIVE MAMMARY CARCINOMA Grade 1 UIG  . BREAST LUMPECTOMY Left 04/13/2017   INVASIVE MAMMARY CARCINOMA/ DCIS, CLEAR MARGINS, NEG. LN  . CESAREAN SECTION    . COLONOSCOPY    . ESOPHAGOGASTRODUODENOSCOPY (EGD) WITH PROPOFOL N/A 02/05/2015   Procedure: ESOPHAGOGASTRODUODENOSCOPY (EGD) WITH PROPOFOL;  Surgeon: Josefine Class, MD;  Location: [facility] ENDOSCOPY;  Service: Endoscopy;  Laterality: N/A;  . PARTIAL MASTECTOMY WITH NEEDLE LOCALIZATION Left 04/13/2017   Procedure: PARTIAL MASTECTOMY WITH NEEDLE LOCALIZATION;  Surgeon: Leonie Green, MD;  Location: [facility] ORS;  Service: General;  Laterality: Left;  . PATELLA FRACTURE SURGERY Left 1991   screws in place  . SAVORY DILATION N/A 02/05/2015   Procedure: SAVORY DILATION;  Surgeon: Josefine Class, MD;  Location: [facility] ENDOSCOPY;  Service: Endoscopy;  Laterality: N/A;  . SENTINEL NODE BIOPSY Left 04/13/2017   Procedure: SENTINEL NODE BIOPSY;  Surgeon: Leonie Green, MD;  Location: [facility] ORS;  Service: General;  Laterality: Left;  . SINUS SURGERY WITH INSTATRAK      Prior to Admission medications   Medication  Sig Start Date End Date Taking? Authorizing Provider  acetaminophen (TYLENOL) 500 MG tablet Take 1,000 mg by mouth every 6 (six) hours as needed for moderate pain or headache.    [provider]  amphetamine-dextroamphetamine (ADDERALL XR) 10 MG 24 hr capsule Take 10 mg by mouth daily.    [provider]  anastrozole (ARIMIDEX) 1 MG tablet TAKE 1 TABLET BY MOUTH EVERY DAY 05/14/19   Sindy Guadeloupe, MD  ARIPiprazole (ABILIFY) 10 MG tablet Take 10 mg by mouth daily.    [provider]  atorvastatin (LIPITOR) 40 MG tablet Take 40 mg by mouth daily.     [provider]  darifenacin (ENABLEX) 15 MG 24 hr tablet Take 15 mg by mouth daily.    [provider]  esomeprazole (NEXIUM) 40 MG capsule Take 40 mg by mouth daily.     [provider]  Estradiol 10 MCG TABS vaginal tablet Place vaginally as needed.     [provider]  fluticasone (FLONASE) 50 MCG/ACT nasal spray Place 2 sprays into both nostrils daily as needed for allergies.     [provider]  levocetirizine (XYZAL) 5 MG tablet every evening. 06/21/18   [provider]  levothyroxine (SYNTHROID, LEVOTHROID) 150 MCG tablet Take 150 mcg by mouth daily before breakfast.    [provider]  LORazepam (ATIVAN) 0.5 MG tablet Take 0.5 mg by mouth 3 (three) times daily.     [provider]  traZODone (DESYREL) 100 MG tablet Take 100 mg by mouth at bedtime. Take  1 and 1/2 tablet for a total of 150mg  daily    [provider]  vitamin B-12 (CYANOCOBALAMIN) 1000 MCG tablet Take 1,000 mcg by mouth daily.    [provider]  Vitamin D, Ergocalciferol, (DRISDOL) 50000 units CAPS capsule Take 50,000 Units by mouth every 7 (seven) days.    [provider]  Vortioxetine HBr (TRINTELLIX) 20 MG TABS Take 20 mg by mouth daily.    [provider]    Allergies Amoxicillin, Erythromycin, Sulfa antibiotics, Iodinated diagnostic agents,  and Morphine  Family History  Problem Relation Age of Onset  . Breast cancer Maternal Aunt 70       deceased 59s  . Breast cancer Cousin 24       daughter of mat aunt with breast cancer  . Breast cancer Cousin 4       daughter of mat aunt with breast cancer  . Deep vein thrombosis Mother   . Heart disease Mother   . Stroke Mother   . Heart disease Father   . Parkinson's disease Father   . Heart disease Sister   . Stroke Sister   . Skin cancer Brother   . Heart disease Brother   . Alzheimer's disease Brother   . Lung cancer Paternal Uncle        deceased 39s  . Heart disease Brother   . Alzheimer's disease Brother   . Heart disease Brother   . Alzheimer's disease Brother   . Cancer Brother        unclear primary; currently 7s  . Cirrhosis Sister   . Alzheimer's  disease Sister   . Kidney cancer Son 53       nephrectomy @ [facility]    Social History Social History   Tobacco Use  . Smoking status: Never Smoker  . Smokeless tobacco: Never Used  Substance Use Topics  . Alcohol use: No  . Drug use: No    Review of Systems Constitutional: No fever/chills or recent injury. Eyes: No visual changes. ENT: No sore throat. Respiratory: Denies shortness of breath. Gastrointestinal: No abdominal pain.  No nausea, no vomiting.  No diarrhea.  No constipation. Musculoskeletal: Negative for pain. Skin: Negative for rash. Neurological:Positive for headache, negative for focal weakness or numbness. No confusion or fainting. ___________________________________________   PHYSICAL EXAM:  VITAL SIGNS: ED Triage Vitals  Enc Vitals Group     BP 06/16/19 1925 (!) 142/73     Pulse Rate 06/16/19 1925 78     Resp 06/16/19 1925 18     Temp 06/16/19 1925 97.9 F (36.6 C)     Temp Source 06/16/19 1925 Oral     SpO2 06/16/19 1925 95 %     Weight 06/16/19 1927 192 lb (87.1 kg)     Height 06/16/19 1927 5\' 7"  (1.702 m)     Head Circumference --      Peak Flow --      Pain Score  06/16/19 1927 5     Pain Loc --      Pain Edu? --      Excl. in [place]? --     Constitutional: Alert and oriented. Well appearing and in no acute distress. Eyes: Conjunctivae are normal. PERRL. EOMI without expressed pain. No evidence of papilledema on limited exam. Head: Atraumatic. Nose: No congestion/rhinnorhea. Mouth/Throat: Mucous membranes are moist.  Oropharynx non-erythematous. Neck: No stridor. Supple, no meningismus.  Cardiovascular: Normal rate, regular rhythm. Grossly normal heart sounds.  Good peripheral circulation. Respiratory: Normal respiratory effort.  No retractions. Lungs CTAB. Gastrointestinal: Soft and nontender. No distention.  Musculoskeletal: No lower extremity tenderness nor edema.  No joint effusions. Neurologic:  Normal speech and language. No gross focal neurologic deficits are appreciated. No gait instability. Cranial nerves: 2-10 normal as tested. Cerebellar:Normal Romberg, finger-nose-finger, heel to shin, normal gait. Sensorimotor: No aphasia, pronator drift, clonus, sensory loss or abnormal reflexes.  Skin:  Skin is warm, dry and intact. No rash noted. Psychiatric: Mood and affect are normal. Speech and behavior are normal. Normal thought process and cognition.  ____________________________________________   LABS (all labs ordered are listed, but only abnormal results are displayed)  Labs Reviewed - No data to display ____________________________________________  EKG  Not indicated. ____________________________________________  RADIOLOGY  CT Head Wo Contrast  Result Date: 06/16/2019 CLINICAL DATA:  Headache post fall EXAM: CT HEAD WITHOUT CONTRAST TECHNIQUE: Contiguous axial images were obtained from the base of the skull through the vertex without intravenous contrast. COMPARISON:  MRI 10/20/2013 FINDINGS: Brain: No evidence of acute infarction, hemorrhage, hydrocephalus, extra-axial collection or mass lesion/mass effect. Vascular: No hyperdense  vessel or unexpected calcification. Skull: Normal. Negative for fracture or focal lesion. Sinuses/Orbits: No acute finding. Other: None IMPRESSION: Negative non contrasted CT appearance of the brain Electronically Signed   By: Donavan Foil M.D.   On: 06/16/2019 20:21   ____________________________________________   PROCEDURES  Procedure(s) performed:  Procedures  Critical Care performed: No ____________________________________________   INITIAL IMPRESSION / ASSESSMENT AND PLAN / ED COURSE  69 year old female presenting to the emergency department after mechanical, nonsyncopal fall.  See HPI for further details.  CT of the head was performed while awaiting ER room assignment.  No acute intracranial findings on noncontrast CT.  Differential diagnosis included but not limited to: Intracranial hemorrhage, concussion, CVA, posttraumatic headache  CT of the head and patient's exam is reassuring.  She will be discharged home home and advised to take Naprosyn or Tylenol.  She was encouraged to follow-up with her primary care provider if she continues to have a headache.  For any symptom that changes or worsens if she is unable to schedule an appointment she is to return to the emergency department.  Head injury instructions will also be provided.  Pertinent labs & imaging results that were available during my care of the patient were reviewed by me and considered in my medical decision making (see chart for details). ____________________________________________   FINAL CLINICAL IMPRESSION(S) / ED DIAGNOSES  Final diagnoses:  Minor head injury, initial encounter  Intractable acute post-traumatic headache    ED Discharge Orders    None       Victorino Dike, FNP 06/18/19 2233    Vanessa Fairview, MD 06/22/19 [phone]

## 2019-06-16 NOTE — ED Notes (Signed)
Pt reports headache 4/10 with no visual changes or light sensitivity. Pupils equal and brisk reaction. Pt reports hitting knee when she fell, but that it does not hurt anymore. No LOC

## 2019-06-16 NOTE — ED Triage Notes (Addendum)
Pt brought in via ems from home.  Pt reports tripping over a toy and falling striking her head on the floor. Pt has a headache.  No loc  No vomiting.  No abrasion/lac.  Denies neck or back pain.  Pt alert   Speech clear.  No blood thinners.

## 2019-07-18 ENCOUNTER — Ambulatory Visit: Payer: Medicare Other | Attending: Internal Medicine

## 2019-07-18 ENCOUNTER — Other Ambulatory Visit: Payer: Self-pay

## 2019-07-18 DIAGNOSIS — Z23 Encounter for immunization: Secondary | ICD-10-CM

## 2019-07-18 NOTE — Progress Notes (Signed)
   Covid-19 Vaccination Clinic  Name:  April Leblanc    MRN: [id number] DOB: 07-Oct-1950  07/18/2019  Ms. Hohnstein was observed post Covid-19 immunization for 15 minutes without incident. She was provided with Vaccine Information Sheet and instruction to access the V-Safe system.   Ms. Blackham was instructed to call 911 with any severe reactions post vaccine: Marland Kitchen Difficulty breathing  . Swelling of face and throat  . A fast heartbeat  . A bad rash all over body  . Dizziness and weakness   Immunizations Administered    Name Date Dose VIS Date Route   Pfizer COVID-19 Vaccine 07/18/2019  8:30 AM 0.3 mL 04/11/2019 Intramuscular   Manufacturer: [place]   Lot: [id number]   [place]: [id number]

## 2019-08-10 ENCOUNTER — Other Ambulatory Visit: Payer: Self-pay | Admitting: Oncology

## 2019-08-11 ENCOUNTER — Ambulatory Visit: Payer: Medicare Other | Attending: Internal Medicine

## 2019-08-11 DIAGNOSIS — Z23 Encounter for immunization: Secondary | ICD-10-CM

## 2019-08-11 NOTE — Progress Notes (Signed)
   Covid-19 Vaccination Clinic  Name:  April Leblanc    MRN: [id number] DOB: 09-11-1950  08/11/2019  [name] was observed post Covid-19 immunization for 30 minutes based on pre-vaccination screening without incident. She was provided with Vaccine Information Sheet and instruction to access the V-Safe system.   [name] was instructed to call 911 with any severe reactions post vaccine: Marland Kitchen Difficulty breathing  . Swelling of face and throat  . A fast heartbeat  . A bad rash all over body  . Dizziness and weakness   Immunizations Administered    Name Date Dose VIS Date Route   Pfizer COVID-19 Vaccine 08/11/2019  4:01 PM 0.3 mL 04/11/2019 Intramuscular   Manufacturer: [place]   Lot: [id number]   [place]: [id number]

## 2019-08-13 ENCOUNTER — Ambulatory Visit: Payer: Medicare Other

## 2019-08-26 ENCOUNTER — Encounter: Payer: Self-pay | Admitting: Radiation Oncology

## 2019-08-26 ENCOUNTER — Other Ambulatory Visit: Payer: Self-pay

## 2019-08-27 ENCOUNTER — Ambulatory Visit
Admission: RE | Admit: 2019-08-27 | Discharge: 2019-08-27 | Disposition: A | Discharge status: Home / Self Care | Payer: Medicare Other | Source: Ambulatory Visit | Attending: Radiation Oncology | Admitting: Radiation Oncology

## 2019-08-27 ENCOUNTER — Encounter: Payer: Self-pay | Admitting: Radiation Oncology

## 2019-08-27 VITALS — BP 138/87 | HR 88 | Temp 97.9°F | Wt 199.4 lb

## 2019-08-27 DIAGNOSIS — C50412 Malignant neoplasm of upper-outer quadrant of left female breast: Secondary | ICD-10-CM

## 2019-08-27 DIAGNOSIS — Z170 Estrogen receptor positive status [ER+]: Secondary | ICD-10-CM

## 2019-08-27 NOTE — Progress Notes (Signed)
Radiation Oncology Follow up Note  Name: April Leblanc   Date:   08/27/2019 MRN:  [id number] DOB: 1950-10-09    This 69 y.o. female presents to the clinic today for 2-year follow-up status post whole breast radiation to her left breast for stage I ER/PR positive invasive mammary carcinoma.  REFERRING PROVIDER: Clarisse Gouge, MD  HPI: Patient is a 69 year old female now out 2 years having completed whole breast radiation to her left breast for stage I ER positive invasive mammary carcinoma.  Seen today in routine follow-up she is doing well she specifically denies breast tenderness cough or bone pain..  She is currently on Arimidex tolerating that well without side effect.  She had mammograms back in December which I have reviewed were BI-RADS 2 benign.  COMPLICATIONS OF TREATMENT: none  FOLLOW UP COMPLIANCE: keeps appointments   PHYSICAL EXAM:  BP 138/87 (BP Location: Right Arm, Patient Position: Sitting, Cuff Size: Normal)   Pulse 88   Temp 97.9 F (36.6 C) (Tympanic)   Wt 199 lb 7 oz (90.5 kg)   BMI 31.24 kg/m  Lungs are clear to A&P cardiac examination essentially unremarkable with regular rate and rhythm. No dominant mass or nodularity is noted in either breast in 2 positions examined. Incision is well-healed. No axillary or supraclavicular adenopathy is appreciated. Cosmetic result is excellent.  Well-developed well-nourished patient in NAD. HEENT reveals PERLA, EOMI, discs not visualized.  Oral cavity is clear. No oral mucosal lesions are identified. Neck is clear without evidence of cervical or supraclavicular adenopathy. Lungs are clear to A&P. Cardiac examination is essentially unremarkable with regular rate and rhythm without murmur rub or thrill. Abdomen is benign with no organomegaly or masses noted. Motor sensory and DTR levels are equal and symmetric in the upper and lower extremities. Cranial nerves II through XII are grossly intact. Proprioception is intact. No  peripheral adenopathy or edema is identified. No motor or sensory levels are noted. Crude visual fields are within normal range.  RADIOLOGY RESULTS: Mammograms reviewed compatible with above-stated findings  PLAN: Present time patient now 2 years out doing well with no evidence of disease.  I am pleased with her overall progress.  I have asked to see her back in 1 year for follow-up.  She continues on Arimidex.  Patient knows to call with any concerns.  I would like to take this opportunity to thank you for allowing me to participate in the care of your patient.April Filbert, MD

## 2019-10-17 ENCOUNTER — Other Ambulatory Visit: Payer: Medicare Other

## 2019-10-17 ENCOUNTER — Ambulatory Visit: Payer: Medicare Other | Admitting: Oncology

## 2019-10-24 ENCOUNTER — Other Ambulatory Visit: Payer: Self-pay | Admitting: *Deleted

## 2019-10-24 ENCOUNTER — Encounter: Payer: Self-pay | Admitting: Oncology

## 2019-10-24 ENCOUNTER — Inpatient Hospital Stay: Payer: Medicare Other | Attending: Oncology

## 2019-10-24 ENCOUNTER — Other Ambulatory Visit: Payer: Self-pay

## 2019-10-24 ENCOUNTER — Inpatient Hospital Stay (HOSPITAL_BASED_OUTPATIENT_CLINIC_OR_DEPARTMENT_OTHER): Payer: Medicare Other | Admitting: Oncology

## 2019-10-24 VITALS — BP 143/80 | HR 90 | Temp 98.4°F | Resp 16 | Wt 198.3 lb

## 2019-10-24 DIAGNOSIS — Z5181 Encounter for therapeutic drug level monitoring: Secondary | ICD-10-CM | POA: Diagnosis not present

## 2019-10-24 DIAGNOSIS — E785 Hyperlipidemia, unspecified: Secondary | ICD-10-CM | POA: Insufficient documentation

## 2019-10-24 DIAGNOSIS — Z853 Personal history of malignant neoplasm of breast: Secondary | ICD-10-CM

## 2019-10-24 DIAGNOSIS — Z08 Encounter for follow-up examination after completed treatment for malignant neoplasm: Secondary | ICD-10-CM

## 2019-10-24 DIAGNOSIS — M1990 Unspecified osteoarthritis, unspecified site: Secondary | ICD-10-CM | POA: Insufficient documentation

## 2019-10-24 DIAGNOSIS — Z170 Estrogen receptor positive status [ER+]: Secondary | ICD-10-CM

## 2019-10-24 DIAGNOSIS — M810 Age-related osteoporosis without current pathological fracture: Secondary | ICD-10-CM

## 2019-10-24 DIAGNOSIS — Z79811 Long term (current) use of aromatase inhibitors: Secondary | ICD-10-CM | POA: Insufficient documentation

## 2019-10-24 DIAGNOSIS — M8580 Other specified disorders of bone density and structure, unspecified site: Secondary | ICD-10-CM

## 2019-10-24 DIAGNOSIS — G4730 Sleep apnea, unspecified: Secondary | ICD-10-CM | POA: Diagnosis not present

## 2019-10-24 DIAGNOSIS — Z7983 Long term (current) use of bisphosphonates: Secondary | ICD-10-CM | POA: Diagnosis not present

## 2019-10-24 DIAGNOSIS — C50912 Malignant neoplasm of unspecified site of left female breast: Secondary | ICD-10-CM | POA: Diagnosis present

## 2019-10-24 DIAGNOSIS — E039 Hypothyroidism, unspecified: Secondary | ICD-10-CM | POA: Diagnosis not present

## 2019-10-24 DIAGNOSIS — Z79899 Other long term (current) drug therapy: Secondary | ICD-10-CM | POA: Insufficient documentation

## 2019-10-24 DIAGNOSIS — K219 Gastro-esophageal reflux disease without esophagitis: Secondary | ICD-10-CM | POA: Insufficient documentation

## 2019-10-24 LAB — COMPREHENSIVE METABOLIC PANEL
ALT: 65 U/L — ABNORMAL HIGH (ref 0–44)
AST: 45 U/L — ABNORMAL HIGH (ref 15–41)
Albumin: 4.1 g/dL (ref 3.5–5.0)
Alkaline Phosphatase: 190 U/L — ABNORMAL HIGH (ref 38–126)
Anion gap: 10 (ref 5–15)
BUN: 16 mg/dL (ref 8–23)
CO2: 26 mmol/L (ref 22–32)
Calcium: 9.2 mg/dL (ref 8.9–10.3)
Chloride: 104 mmol/L (ref 98–111)
Creatinine, Ser: 0.72 mg/dL (ref 0.44–1.00)
GFR calc Af Amer: >60 mL/min (ref >60)
GFR calc non Af Amer: >60 mL/min (ref >60)
Glucose, Bld: 180 mg/dL — ABNORMAL HIGH (ref 70–99)
Potassium: 4 mmol/L (ref 3.5–5.1)
Sodium: 140 mmol/L (ref 135–145)
Total Bilirubin: 0.5 mg/dL (ref 0.3–1.2)
Total Protein: 7.2 g/dL (ref 6.5–8.1)

## 2019-10-24 LAB — CBC WITH DIFFERENTIAL/PLATELET
Abs Immature Granulocytes: 0.06 10*3/uL (ref 0.00–0.07)
Basophils Absolute: 0.1 10*3/uL (ref 0.0–0.1)
Basophils Relative: 1 %
Eosinophils Absolute: 0.1 10*3/uL (ref 0.0–0.5)
Eosinophils Relative: 1 %
HCT: 40.3 % (ref 36.0–46.0)
Hemoglobin: 13.5 g/dL (ref 12.0–15.0)
Immature Granulocytes: 1 %
Lymphocytes Relative: 16 %
Lymphs Abs: 1.9 10*3/uL (ref 0.7–4.0)
MCH: 28.4 pg (ref 26.0–34.0)
MCHC: 33.5 g/dL (ref 30.0–36.0)
MCV: 84.8 fL (ref 80.0–100.0)
Monocytes Absolute: 0.6 10*3/uL (ref 0.1–1.0)
Monocytes Relative: 5 %
Neutro Abs: 9.4 10*3/uL — ABNORMAL HIGH (ref 1.7–7.7)
Neutrophils Relative %: 76 %
Platelets: 225 10*3/uL (ref 150–400)
RBC: 4.75 MIL/uL (ref 3.87–5.11)
RDW: 14 % (ref 11.5–15.5)
WBC: 12.2 10*3/uL — ABNORMAL HIGH (ref 4.0–10.5)
nRBC: 0 % (ref 0.0–0.2)

## 2019-10-24 NOTE — Progress Notes (Signed)
Pt says that she is sensitive on her breast when her grandson hugs her. She does feel something on left breast but she thinks it is car tissue from the cancer surgery

## 2019-10-26 NOTE — Progress Notes (Signed)
Hematology/Oncology Consult note [facility]  [phone] [phone]  Patient Care Team: Gustavo Lah, MD as PCP - General (Student) Noreene Filbert, MD as Radiation Oncologist (Radiation Oncology)   Name of the patient: April Leblanc  [id number]  09/29/1950   Date of visit: 10/26/19  Diagnosis- invasive mammary of the left breast stage I acT1b cN0 cM0ER greater than 90% positive, PR 1% positive and HER-2/neu equivocal on [facility] negative   Chief complaint/ Reason for visit- routine f/u of breast cancer on arimidex  Heme/Onc history: Patient is a 69 year old female with left breast cancer diagnosed in this 2018 stage I.  6 mm tumor with negative lymph nodes that was ER/PR positive and negative.  Patient did not adjuvant chemotherapy and went on to receive adjuvant radiation treatment and started taking Arimidex in march 2019. She gets reclast for osteopenia  Interval history-patient is currently doing well and denies any complaints at this time.  Appetite and weight are stable.  Denies any new aches or pains anywhere  ECOG PS- 1 Pain scale- 0   Review of systems- Review of Systems  Constitutional: Negative for chills, fever, malaise/fatigue and weight loss.  HENT: Negative for congestion, ear discharge and nosebleeds.   Eyes: Negative for blurred vision.  Respiratory: Negative for cough, hemoptysis, sputum production, shortness of breath and wheezing.   Cardiovascular: Negative for chest pain, palpitations, orthopnea and claudication.  Gastrointestinal: Negative for abdominal pain, blood in stool, constipation, diarrhea, heartburn, melena, nausea and vomiting.  Genitourinary: Negative for dysuria, flank pain, frequency, hematuria and urgency.  Musculoskeletal: Negative for back pain, joint pain and myalgias.  Skin: Negative for rash.  Neurological: Negative for dizziness, tingling, focal weakness, seizures, weakness and  headaches.  Endo/Heme/Allergies: Does not bruise/bleed easily.  Psychiatric/Behavioral: Negative for depression and suicidal ideas. The patient does not have insomnia.      Allergies  Allergen Reactions  . Amoxicillin Hives, Itching and Other (See Comments)    Has patient had a PCN reaction causing immediate rash, facial/tongue/throat swelling, SOB or lightheadedness with hypotension: No Has patient had a PCN reaction causing severe rash involving mucus membranes or skin necrosis: No Has patient had a PCN reaction that required hospitalization: No Has patient had a PCN reaction occurring within the last 10 years: No If all of the above answers are "NO", then may proceed with Cephalosporin use.   . Erythromycin Hives and Itching  . Sulfa Antibiotics Hives and Itching  . Iodinated Diagnostic Agents Rash    Red rash and itching. Topical iodine not a problem.  . Morphine Nausea And Vomiting and Other (See Comments)    Pretty severe vomiting. Can take hydrocodone and oxycodone       Past Medical History:  Diagnosis Date  . Anemia   . Ankle sprain 03/2017  . Arthritis   . Breast cancer ([place]) 2018  . Breast cancer ([place]) 03/2017  . Depression   . Fracture of distal end of radius 03/2017   left  . Genetic testing 05/04/2017   Multi-Cancer panel (83 genes) @ [facility] - No pathogenic mutations detected  . GERD (gastroesophageal reflux disease)   . Headache   . Hyperlipidemia   . Hypothyroidism   . Personal history of radiation therapy   . Pre-diabetes 03/2017   follows healthy diabetic diet  . Sleep apnea    has not used cpap in a long time  . Vitamin B 12 deficiency      Past Surgical  History:  Procedure Laterality Date  . ABDOMINAL HYSTERECTOMY  1983  . APPENDECTOMY  1977   when she had c-section  . BREAST BIOPSY Left 03/27/2017   INVASIVE MAMMARY CARCINOMA Grade 1 UIG  . BREAST LUMPECTOMY Left 04/13/2017   INVASIVE MAMMARY CARCINOMA/ DCIS, CLEAR MARGINS, NEG. LN  .  CESAREAN SECTION    . COLONOSCOPY    . ESOPHAGOGASTRODUODENOSCOPY (EGD) WITH PROPOFOL N/A 02/05/2015   Procedure: ESOPHAGOGASTRODUODENOSCOPY (EGD) WITH PROPOFOL;  Surgeon: Josefine Class, MD;  Location: [facility] ENDOSCOPY;  Service: Endoscopy;  Laterality: N/A;  . PARTIAL MASTECTOMY WITH NEEDLE LOCALIZATION Left 04/13/2017   Procedure: PARTIAL MASTECTOMY WITH NEEDLE LOCALIZATION;  Surgeon: Leonie Green, MD;  Location: [facility] ORS;  Service: General;  Laterality: Left;  . PATELLA FRACTURE SURGERY Left 1991   screws in place  . SAVORY DILATION N/A 02/05/2015   Procedure: SAVORY DILATION;  Surgeon: Josefine Class, MD;  Location: [facility] ENDOSCOPY;  Service: Endoscopy;  Laterality: N/A;  . SENTINEL NODE BIOPSY Left 04/13/2017   Procedure: SENTINEL NODE BIOPSY;  Surgeon: Leonie Green, MD;  Location: [facility] ORS;  Service: General;  Laterality: Left;  . SINUS SURGERY WITH INSTATRAK      Social History   Socioeconomic History  . Marital status: Married    Spouse name: Not on file  . Number of children: Not on file  . Years of education: Not on file  . Highest education level: Not on file  Occupational History  . Not on file  Tobacco Use  . Smoking status: Never Smoker  . Smokeless tobacco: Never Used  Vaping Use  . Vaping Use: Never used  Substance and Sexual Activity  . Alcohol use: No  . Drug use: No  . Sexual activity: Not Currently  Other Topics Concern  . Not on file  Social History Narrative  . Not on file   Social Determinants of Health   Financial Resource Strain:   . Difficulty of Paying Living Expenses:   Food Insecurity:   . Worried About Charity fundraiser in the Last Year:   . Arboriculturist in the Last Year:   Transportation Needs:   . Film/video editor (Medical):   Marland Kitchen Lack of Transportation (Non-Medical):   Physical Activity:   . Days of Exercise per Week:   . Minutes of Exercise per Session:   Stress:   . Feeling of Stress :   Social  Connections:   . Frequency of Communication with Friends and Family:   . Frequency of Social Gatherings with Friends and Family:   . Attends Religious Services:   . Active Member of Clubs or Organizations:   . Attends Archivist Meetings:   Marland Kitchen Marital Status:   Intimate Partner Violence:   . Fear of Current or Ex-Partner:   . Emotionally Abused:   Marland Kitchen Physically Abused:   . Sexually Abused:     Family History  Problem Relation Age of Onset  . Breast cancer Maternal Aunt 70       deceased 34s  . Breast cancer Cousin 11       daughter of mat aunt with breast cancer  . Breast cancer Cousin 13       daughter of mat aunt with breast cancer  . Deep vein thrombosis Mother   . Heart disease Mother   . Stroke Mother   . Heart disease Father   . Parkinson's disease Father   . Heart disease Sister   .  Stroke Sister   . Skin cancer Brother   . Heart disease Brother   . Alzheimer's disease Brother   . Lung cancer Paternal Uncle        deceased 31s  . Heart disease Brother   . Alzheimer's disease Brother   . Heart disease Brother   . Alzheimer's disease Brother   . Cancer Brother        unclear primary; currently 64s  . Cirrhosis Sister   . Alzheimer's disease Sister   . Kidney cancer Son 21       nephrectomy @ [facility]     Current Outpatient Medications:  .  acetaminophen (TYLENOL) 500 MG tablet, Take 1,000 mg by mouth every 6 (six) hours as needed for moderate pain or headache., Disp: , Rfl:  .  amphetamine-dextroamphetamine (ADDERALL XR) 10 MG 24 hr capsule, Take 10 mg by mouth daily., Disp: , Rfl:  .  anastrozole (ARIMIDEX) 1 MG tablet, TAKE 1 TABLET BY MOUTH EVERY DAY, Disp: 90 tablet, Rfl: 0 .  ARIPiprazole (ABILIFY) 10 MG tablet, Take 10 mg by mouth daily., Disp: , Rfl:  .  atorvastatin (LIPITOR) 40 MG tablet, Take 40 mg by mouth daily. , Disp: , Rfl:  .  darifenacin (ENABLEX) 15 MG 24 hr tablet, Take 15 mg by mouth daily., Disp: , Rfl:  .  esomeprazole (NEXIUM) 40  MG capsule, Take 40 mg by mouth daily. , Disp: , Rfl:  .  Estradiol 10 MCG TABS vaginal tablet, Place vaginally as needed. , Disp: , Rfl:  .  fluticasone (FLONASE) 50 MCG/ACT nasal spray, Place 2 sprays into both nostrils daily as needed for allergies. , Disp: , Rfl:  .  levocetirizine (XYZAL) 5 MG tablet, Take 5 mg by mouth every evening. , Disp: , Rfl:  .  levothyroxine (SYNTHROID, LEVOTHROID) 150 MCG tablet, Take 150 mcg by mouth daily before breakfast., Disp: , Rfl:  .  LORazepam (ATIVAN) 0.5 MG tablet, Take 0.5 mg by mouth 3 (three) times daily. , Disp: , Rfl:  .  traZODone (DESYREL) 100 MG tablet, Take 150 mg by mouth at bedtime. Take  1 and 1/2 tablet for a total of 146m daily, Disp: , Rfl:  .  vitamin B-12 (CYANOCOBALAMIN) 1000 MCG tablet, Take 1,000 mcg by mouth daily., Disp: , Rfl:  .  Vitamin D, Ergocalciferol, (DRISDOL) 50000 units CAPS capsule, Take 50,000 Units by mouth every 7 (seven) days., Disp: , Rfl:  .  Vortioxetine HBr (TRINTELLIX) 20 MG TABS, Take 20 mg by mouth daily., Disp: , Rfl:   Physical exam:  Vitals:   10/24/19 1112  BP: (!) 143/80  Pulse: 90  Resp: 16  Temp: 98.4 F (36.9 C)  TempSrc: Oral  SpO2: 98%  Weight: 198 lb 4.8 oz (89.9 kg)   Physical Exam Constitutional:      General: She is not in acute distress. Cardiovascular:     Rate and Rhythm: Normal rate and regular rhythm.     Heart sounds: Normal heart sounds.  Pulmonary:     Effort: Pulmonary effort is normal.     Breath sounds: Normal breath sounds.  Skin:    General: Skin is warm and dry.  Neurological:     Mental Status: She is alert and oriented to person, place, and time.    Breast exam was performed in seated and lying down position. Patient is status post left lumpectomy with a well-healed surgical scar. No evidence of any palpable masses. No evidence of axillary adenopathy.  No evidence of any palpable masses or lumps in the right breast. No evidence of right axillary  adenopathy   CMP Latest Ref Rng & Units 10/24/2019  Glucose 70 - 99 mg/dL 180(H)  BUN 8 - 23 mg/dL 16  Creatinine 0.44 - 1.00 mg/dL 0.72  Sodium 135 - 145 mmol/L 140  Potassium 3.5 - 5.1 mmol/L 4.0  Chloride 98 - 111 mmol/L 104  CO2 22 - 32 mmol/L 26  Calcium 8.9 - 10.3 mg/dL 9.2  Total Protein 6.5 - 8.1 g/dL 7.2  Total Bilirubin 0.3 - 1.2 mg/dL 0.5  Alkaline Phos 38 - 126 U/L 190(H)  AST 15 - 41 U/L 45(H)  ALT 0 - 44 U/L 65(H)   CBC Latest Ref Rng & Units 10/24/2019  WBC 4.0 - 10.5 K/uL 12.2(H)  Hemoglobin 12.0 - 15.0 g/dL 13.5  Hematocrit 36 - 46 % 40.3  Platelets 150 - 400 K/uL 225    Assessment and plan- Patient is a 69 y.o. 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   This is a routine follow-up visit for breast cancer  Patient is doing well clinically and no concerning signs and symptoms of recurrence.  Patient will continue to take Arimidex along with calcium and vitamin D for total 5 years  She would be due for a mammogram in December 2021 which we will schedule.  She is also due for bone density scan next month which we will schedule.  Patient will get Reclast next month.  I will see her back in 6 months.  No labs   Visit Diagnosis 1. Encounter for follow-up surveillance of breast cancer   2. Visit for monitoring Arimidex therapy   3. Long term (current) use of bisphosphonates   4. Osteopenia, unspecified location      Dr. Randa Evens, MD, MPH [facility] at [facility] [phone] 10/26/2019 10:58 AM

## 2019-11-08 ENCOUNTER — Other Ambulatory Visit: Payer: Self-pay | Admitting: Oncology

## 2019-11-21 ENCOUNTER — Other Ambulatory Visit: Payer: Medicare Other

## 2019-11-21 ENCOUNTER — Ambulatory Visit: Payer: Medicare Other

## 2019-11-24 ENCOUNTER — Ambulatory Visit
Admission: RE | Admit: 2019-11-24 | Discharge: 2019-11-24 | Disposition: A | Discharge status: Home / Self Care | Payer: Medicare Other | Source: Ambulatory Visit | Attending: Oncology | Admitting: Oncology

## 2019-11-24 DIAGNOSIS — Z170 Estrogen receptor positive status [ER+]: Secondary | ICD-10-CM | POA: Diagnosis present

## 2019-11-24 DIAGNOSIS — C50412 Malignant neoplasm of upper-outer quadrant of left female breast: Secondary | ICD-10-CM | POA: Insufficient documentation

## 2019-11-24 DIAGNOSIS — M810 Age-related osteoporosis without current pathological fracture: Secondary | ICD-10-CM

## 2019-11-24 DIAGNOSIS — Z79899 Other long term (current) drug therapy: Secondary | ICD-10-CM

## 2019-11-25 ENCOUNTER — Inpatient Hospital Stay: Payer: Medicare Other

## 2019-11-25 ENCOUNTER — Other Ambulatory Visit: Payer: Self-pay

## 2019-11-25 ENCOUNTER — Inpatient Hospital Stay: Payer: Medicare Other | Attending: Oncology

## 2019-11-25 VITALS — BP 130/72 | HR 76 | Temp 96.9°F | Resp 18

## 2019-11-25 DIAGNOSIS — Z7983 Long term (current) use of bisphosphonates: Secondary | ICD-10-CM

## 2019-11-25 DIAGNOSIS — M8580 Other specified disorders of bone density and structure, unspecified site: Secondary | ICD-10-CM | POA: Insufficient documentation

## 2019-11-25 DIAGNOSIS — Z170 Estrogen receptor positive status [ER+]: Secondary | ICD-10-CM | POA: Diagnosis not present

## 2019-11-25 DIAGNOSIS — Z79899 Other long term (current) drug therapy: Secondary | ICD-10-CM | POA: Insufficient documentation

## 2019-11-25 DIAGNOSIS — C50412 Malignant neoplasm of upper-outer quadrant of left female breast: Secondary | ICD-10-CM | POA: Diagnosis not present

## 2019-11-25 DIAGNOSIS — Z08 Encounter for follow-up examination after completed treatment for malignant neoplasm: Secondary | ICD-10-CM

## 2019-11-25 DIAGNOSIS — Z79811 Long term (current) use of aromatase inhibitors: Secondary | ICD-10-CM | POA: Diagnosis not present

## 2019-11-25 LAB — COMPREHENSIVE METABOLIC PANEL
ALT: 79 U/L — ABNORMAL HIGH (ref 0–44)
AST: 64 U/L — ABNORMAL HIGH (ref 15–41)
Albumin: 4.2 g/dL (ref 3.5–5.0)
Alkaline Phosphatase: 166 U/L — ABNORMAL HIGH (ref 38–126)
Anion gap: 11 (ref 5–15)
BUN: 14 mg/dL (ref 8–23)
CO2: 27 mmol/L (ref 22–32)
Calcium: 9.3 mg/dL (ref 8.9–10.3)
Chloride: 100 mmol/L (ref 98–111)
Creatinine, Ser: 0.77 mg/dL (ref 0.44–1.00)
GFR calc Af Amer: >60 mL/min (ref >60)
GFR calc non Af Amer: >60 mL/min (ref >60)
Glucose, Bld: 264 mg/dL — ABNORMAL HIGH (ref 70–99)
Potassium: 4.2 mmol/L (ref 3.5–5.1)
Sodium: 138 mmol/L (ref 135–145)
Total Bilirubin: 0.7 mg/dL (ref 0.3–1.2)
Total Protein: 7.5 g/dL (ref 6.5–8.1)

## 2019-11-25 MED ORDER — SODIUM CHLORIDE 0.9 % IV SOLN
Freq: Once | INTRAVENOUS | Status: AC
  Filled 2019-11-25: qty 250

## 2019-11-25 MED ORDER — ZOLEDRONIC ACID 5 MG/100ML IV SOLN
5.0000 mg | INTRAVENOUS | Status: DC
  Administered 2019-11-25: 5 mg via INTRAVENOUS
  Filled 2019-11-25: qty 100

## 2019-11-26 ENCOUNTER — Other Ambulatory Visit: Payer: Self-pay | Admitting: *Deleted

## 2019-11-26 DIAGNOSIS — R748 Abnormal levels of other serum enzymes: Secondary | ICD-10-CM

## 2019-11-26 NOTE — Progress Notes (Signed)
I called and spoke to pt about her liver enzymes are still elevated. Because of this dr Janese Banks would like her to have RUQ u/s and refer to GI due to the elevated liver enzymes.  She is agreeable to this and she prefers [facility] for the referral.

## 2019-11-28 ENCOUNTER — Telehealth: Payer: Self-pay | Admitting: *Deleted

## 2019-11-28 ENCOUNTER — Other Ambulatory Visit: Payer: Self-pay | Admitting: *Deleted

## 2019-11-28 DIAGNOSIS — R748 Abnormal levels of other serum enzymes: Secondary | ICD-10-CM

## 2019-11-28 DIAGNOSIS — Z170 Estrogen receptor positive status [ER+]: Secondary | ICD-10-CM

## 2019-11-28 NOTE — Telephone Encounter (Signed)
I called pt and last week when I spoke to her she was in office with her grandkid and could not talk very much. She wanted to know what specialist she was being referred about her liver enzymes.  It was GI specialist. She understands it now. She knows that GI will call her with appt. I did tell her that we want her to have u/s before seeing GI. She is ok with this

## 2019-11-28 NOTE — Telephone Encounter (Signed)
-----   Message from Liana Crocker sent at 11/28/2019  2:58 PM EDT ----- Scheduled and patient is aware. Patient was asking what kind of specialist she was going to need to see. I had no idea. Can you call her back on this please? Thanks! ----- Message ----- From: Luella Cook, RN Sent: 11/28/2019   2:44 PM EDT To: Liana Crocker  Can you make he a u/s RUQ. Then call her with it. I spoke to her and told her that youwould scheudled it and call her. She was agreeable to the test

## 2019-12-03 ENCOUNTER — Other Ambulatory Visit (HOSPITAL_COMMUNITY): Payer: Self-pay | Admitting: Oncology

## 2019-12-03 DIAGNOSIS — R748 Abnormal levels of other serum enzymes: Secondary | ICD-10-CM

## 2019-12-05 ENCOUNTER — Ambulatory Visit
Admission: RE | Admit: 2019-12-05 | Discharge: 2019-12-05 | Disposition: A | Discharge status: Home / Self Care | Payer: Medicare Other | Source: Ambulatory Visit | Attending: Oncology | Admitting: Oncology

## 2019-12-05 ENCOUNTER — Other Ambulatory Visit: Payer: Self-pay

## 2019-12-05 ENCOUNTER — Ambulatory Visit: Payer: Medicare Other

## 2019-12-05 DIAGNOSIS — R748 Abnormal levels of other serum enzymes: Secondary | ICD-10-CM

## 2020-01-21 IMAGING — MG DIGITAL DIAGNOSTIC BILAT W/ TOMO W/ CAD
8 of 13 series · 8 of 37 positions shown · non-contrast
Comparison: Previous exam(s).

CLINICAL DATA: 60-year-old female with history of left breast
cancer post lumpectomy [REDACTED].

EXAM:
DIGITAL DIAGNOSTIC BILATERAL MAMMOGRAM WITH CAD AND TOMO

[L MLO]
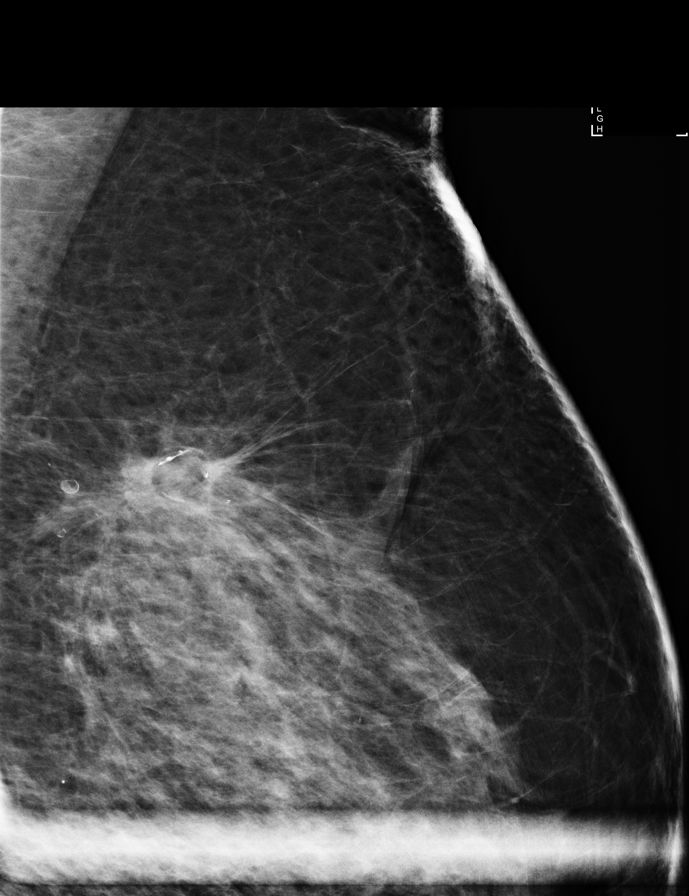

[R MLO synth-2D (1 of 2)]
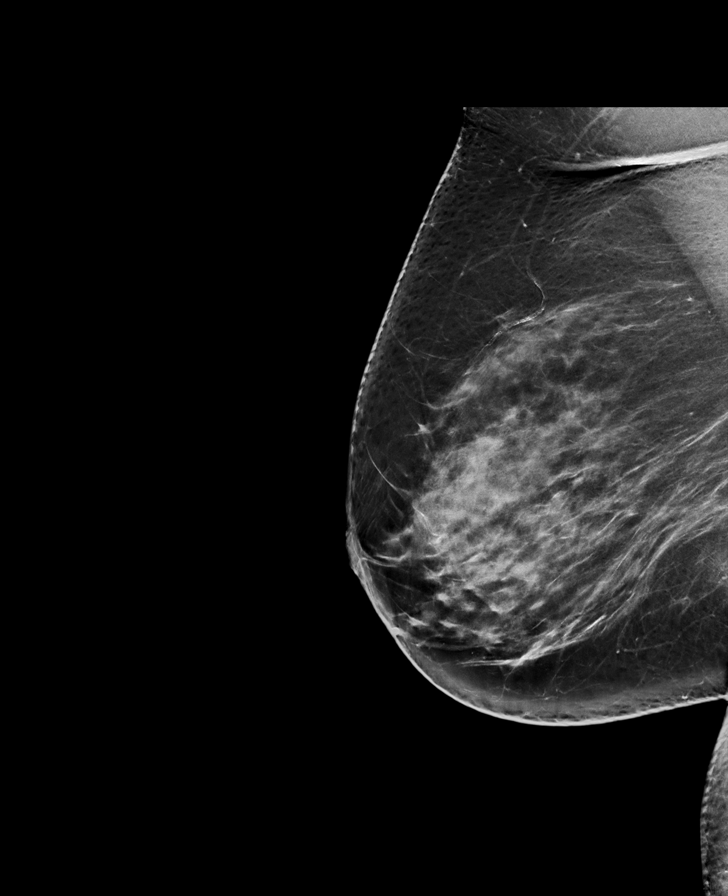

[R CC synth-2D]
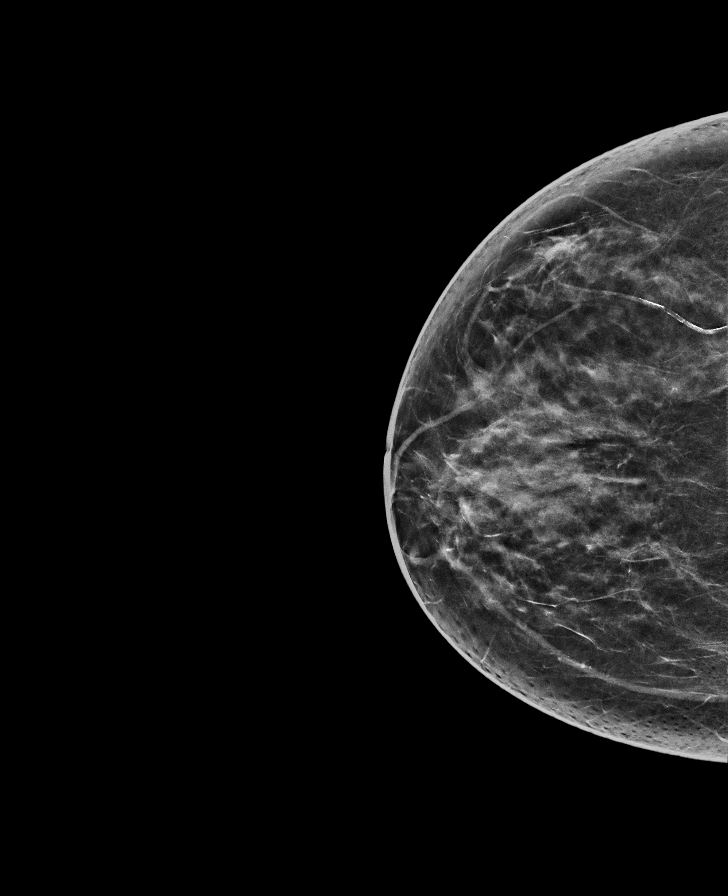

[L CC synth-2D]
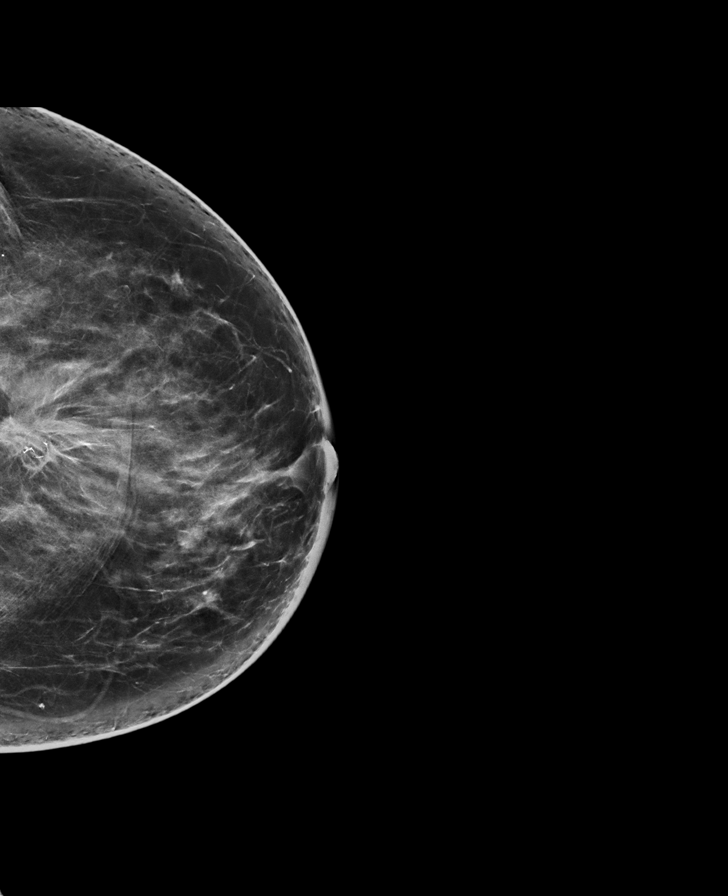

[R MLO synth-2D (2 of 2)]
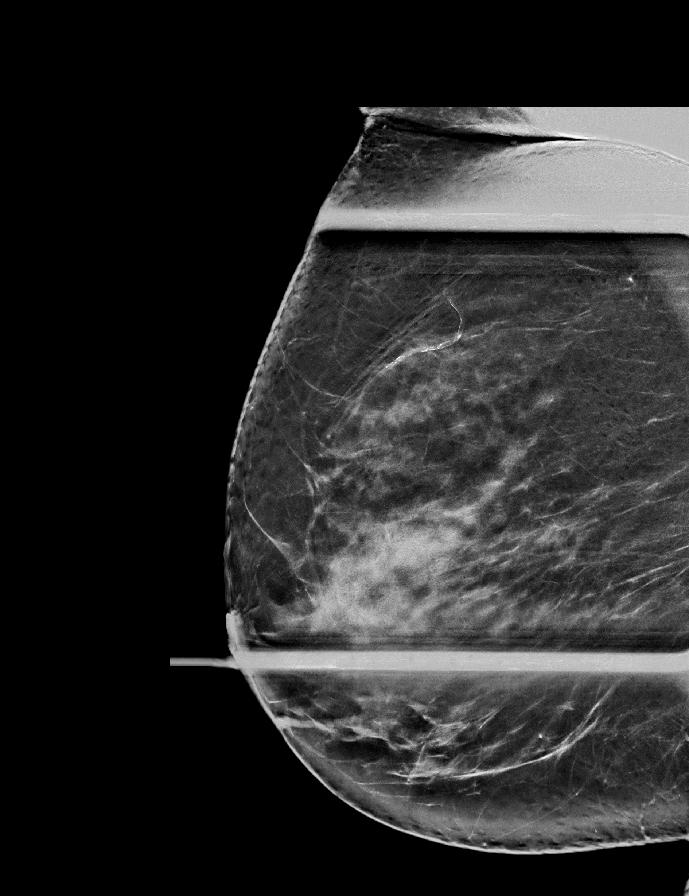

[R ML synth-2D]
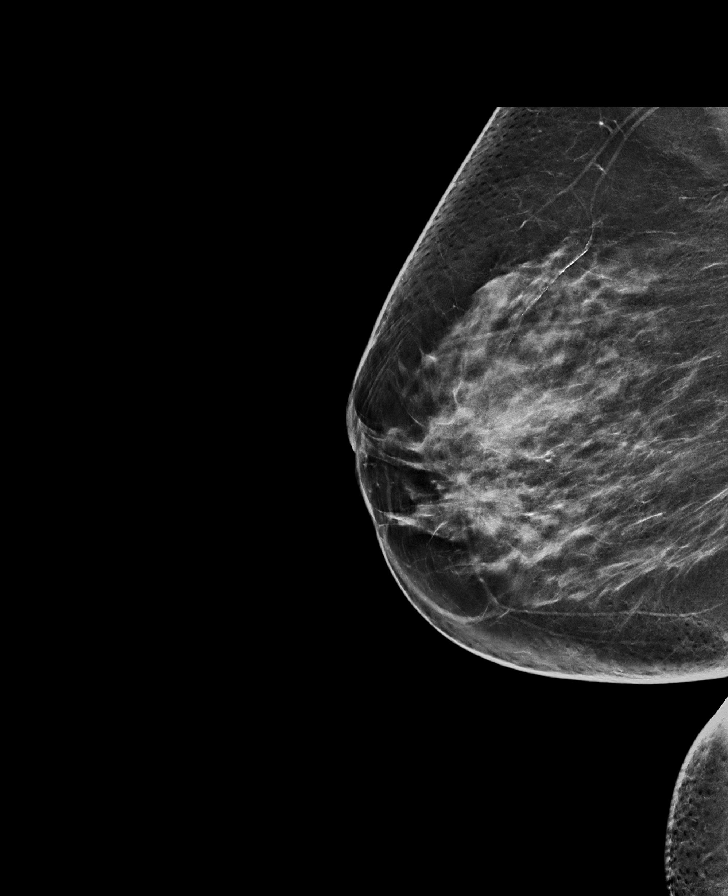

[L MLO synth-2D]
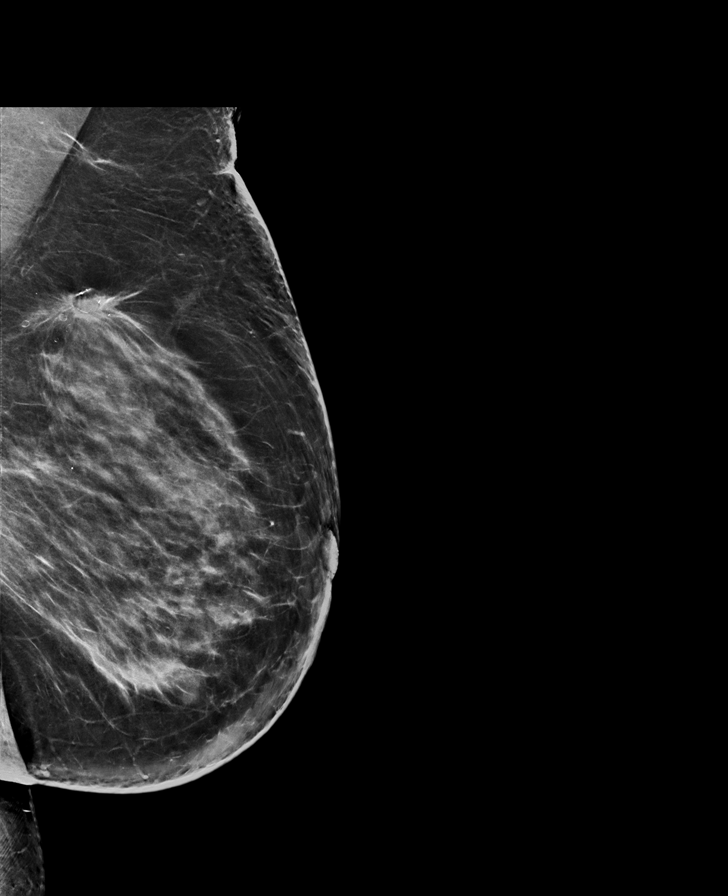

[R ML tomo · tomo slice 37/74.0]
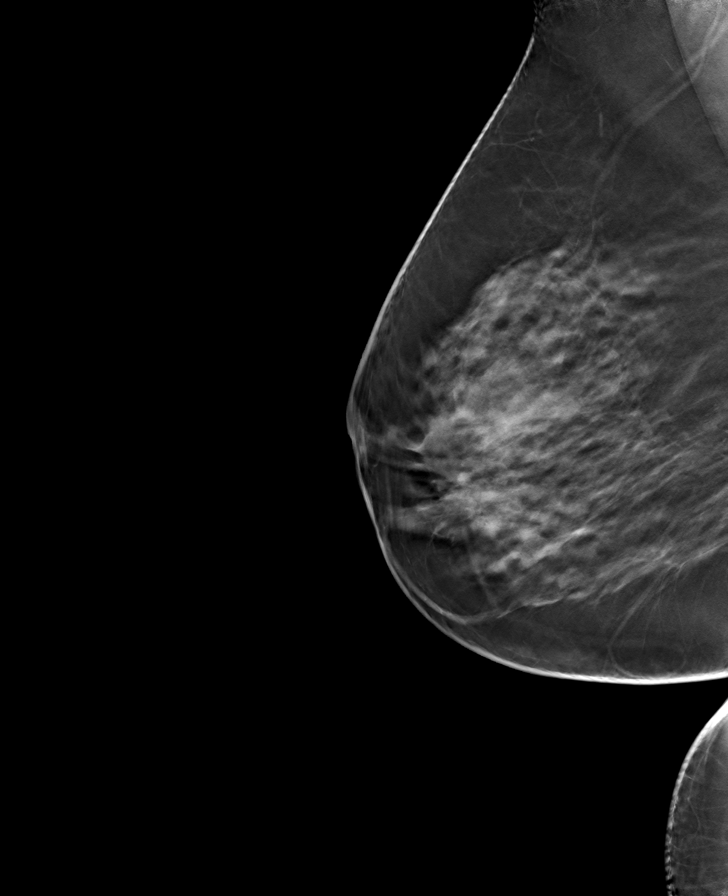

[8 of 37 positions shown; findings below may reference images not displayed]

ACR Breast Density Category c: The breast tissue is heterogeneously
dense, which may obscure small masses.
FINDINGS: No suspicious masses or calcifications are seen in either breast. An
initially questioned asymmetry in the right breast resolves on the
additional imaging with findings compatible with an area of
overlapping fibroglandular tissue. Lumpectomy changes again
identified in the upper posterior left breast. Spot compression
magnification MLO view of the left breast lumpectomy site was
performed. There is no mammographic evidence of locally recurrent
malignancy.

Mammographic images were processed with CAD.
IMPRESSION: No mammographic evidence of malignancy in either breast.

RECOMMENDATION:
Diagnostic mammogram is suggested in 1 year. (Code:OV-I-NVU)

I have discussed the findings and recommendations with the patient.
If applicable, a reminder letter will be sent to the patient
regarding the next appointment.

BI-RADS CATEGORY  2: Benign.

## 2020-02-02 ENCOUNTER — Other Ambulatory Visit: Payer: Self-pay

## 2020-02-05 ENCOUNTER — Other Ambulatory Visit: Payer: Self-pay

## 2020-02-05 ENCOUNTER — Ambulatory Visit (INDEPENDENT_AMBULATORY_CARE_PROVIDER_SITE_OTHER): Payer: Medicare Other | Admitting: Gastroenterology

## 2020-02-05 ENCOUNTER — Encounter: Payer: Self-pay | Admitting: Gastroenterology

## 2020-02-05 VITALS — BP 135/79 | HR 96 | Temp 98.3°F | Ht 67.0 in | Wt 193.4 lb

## 2020-02-05 DIAGNOSIS — R7989 Other specified abnormal findings of blood chemistry: Secondary | ICD-10-CM | POA: Diagnosis not present

## 2020-02-05 DIAGNOSIS — Z1211 Encounter for screening for malignant neoplasm of colon: Secondary | ICD-10-CM | POA: Diagnosis not present

## 2020-02-05 DIAGNOSIS — K2100 Gastro-esophageal reflux disease with esophagitis, without bleeding: Secondary | ICD-10-CM | POA: Diagnosis not present

## 2020-02-05 DIAGNOSIS — K209 Esophagitis, unspecified without bleeding: Secondary | ICD-10-CM

## 2020-02-05 MED ORDER — NA SULFATE-K SULFATE-MG SULF 17.5-3.13-1.6 GM/177ML PO SOLN: 354.0000 mL | Freq: Once | ORAL | 0 refills | Status: AC

## 2020-02-05 NOTE — Progress Notes (Signed)
Cephas Darby, MD [address]  [place]  [place], [place] [address]  Main: [phone]  Fax: [phone]    Gastroenterology Consultation  Referring Provider:     Gustavo Lah, MD Primary Care Physician:  Gustavo Lah, MD Primary Gastroenterologist:  Dr. Cephas Darby Reason for Consultation:     Elevated LFTs        HPI:   April Leblanc is a 69 y.o. female referred by Dr. Arlington Calix, Fanny Dance, MD  for consultation & management of elevated LFTs.  Patient has history of obesity, hypothyroidism, hyperlipidemia, chronic GERD, diabetes is noted to have elevated alkaline phosphatase since 01/2012.  Most recently, she is also found to have elevated AST and ALT, 45/65 in 09/2019, increased to 64/79 in 10/2019.  Unknown hepatitis B and C status.  ANA negative, ceruloplasmin levels were normal in the past.  Right upper quadrant ultrasound revealed gallbladder polyp as well as fatty liver. Patient has history of chronic GERD, underwent EGD in 01/2015 by Dr. Rayann Heman, found to have LA grade B esophagitis and medium size hiatal hernia.  Patient has been on Nexium 40 mg daily since then.  She remains asymptomatic on Nexium.  She denies any lower GI symptoms. She reports having blood transfusion several years ago.  She denies history of IV drug abuse or any other high risk behaviors.  She lost about 7 pounds by following healthy diet.  She has cut back on snacking.  She does drink diet soda a few times a week.  NSAIDs: None  Antiplts/Anticoagulants/Anti thrombotics: None  Patient did not undergo colonoscopy in the past GI Procedures: EGD 02/05/2015 - Medium-sized hiatus hernia. - LA Grade A reflux esophagitis. Biopsied. - Discolored, granular mucosa in the esophagus. Biopsied. - Normal stomach. - Normal examined duodenum. DIAGNOSIS:  A. GEJ; COLD BIOPSY:  - SQUAMOCOLUMNAR MUCOSA WITH MILD GASTRITIS, COMPATIBLE WITH REFLUX.  - NEGATIVE FOR DYSPLASIA AND MALIGNANCY.    B. ESOPHAGUS ABNORMAL MUCOSA, AT 17 CM; COLD BIOPSY:  - REFLUX GASTROESOPHAGITIS.  - NEGATIVE FOR DYSPLASIA AND MALIGNANCY.  Past Medical History:  Diagnosis Date  . Anemia   . Ankle sprain 03/2017  . Arthritis   . Breast cancer ([facility]) 2018  . Breast cancer ([facility]) 03/2017  . Depression   . Fracture of distal end of radius 03/2017   left  . Genetic testing 05/04/2017   Multi-Cancer panel (83 genes) @ [facility] - No pathogenic mutations detected  . GERD (gastroesophageal reflux disease)   . Headache   . Hyperlipidemia   . Hypothyroidism   . Personal history of radiation therapy   . Pre-diabetes 03/2017   follows healthy diabetic diet  . Sleep apnea    has not used cpap in a long time  . Vitamin B 12 deficiency     Past Surgical History:  Procedure Laterality Date  . ABDOMINAL HYSTERECTOMY  1983  . APPENDECTOMY  1977   when she had c-section  . BREAST BIOPSY Left 03/27/2017   INVASIVE MAMMARY CARCINOMA Grade 1 UIG  . BREAST LUMPECTOMY Left 04/13/2017   INVASIVE MAMMARY CARCINOMA/ DCIS, CLEAR MARGINS, NEG. LN  . CESAREAN SECTION    . COLONOSCOPY    . ESOPHAGOGASTRODUODENOSCOPY (EGD) WITH PROPOFOL N/A 02/05/2015   Procedure: ESOPHAGOGASTRODUODENOSCOPY (EGD) WITH PROPOFOL;  Surgeon: Josefine Class, MD;  Location: [facility] ENDOSCOPY;  Service: Endoscopy;  Laterality: N/A;  . PARTIAL MASTECTOMY WITH NEEDLE LOCALIZATION Left 04/13/2017   Procedure: PARTIAL MASTECTOMY WITH NEEDLE LOCALIZATION;  Surgeon:  Leonie Green, MD;  Location: [facility] ORS;  Service: General;  Laterality: Left;  . PATELLA FRACTURE SURGERY Left 1991   screws in place  . SAVORY DILATION N/A 02/05/2015   Procedure: SAVORY DILATION;  Surgeon: Josefine Class, MD;  Location: [facility] ENDOSCOPY;  Service: Endoscopy;  Laterality: N/A;  . SENTINEL NODE BIOPSY Left 04/13/2017   Procedure: SENTINEL NODE BIOPSY;  Surgeon: Leonie Green, MD;  Location: [facility] ORS;  Service: General;  Laterality: Left;  . SINUS  SURGERY WITH INSTATRAK      Current Outpatient Medications:  .  acetaminophen (TYLENOL) 500 MG tablet, Take 1,000 mg by mouth every 6 (six) hours as needed for moderate pain or headache., Disp: , Rfl:  .  amphetamine-dextroamphetamine (ADDERALL XR) 10 MG 24 hr capsule, Take 10 mg by mouth daily., Disp: , Rfl:  .  anastrozole (ARIMIDEX) 1 MG tablet, TAKE 1 TABLET BY MOUTH EVERY DAY, Disp: 90 tablet, Rfl: 0 .  ARIPiprazole (ABILIFY) 10 MG tablet, Take 10 mg by mouth daily., Disp: , Rfl:  .  atorvastatin (LIPITOR) 40 MG tablet, Take 40 mg by mouth daily. , Disp: , Rfl:  .  darifenacin (ENABLEX) 15 MG 24 hr tablet, Take 15 mg by mouth daily., Disp: , Rfl:  .  esomeprazole (NEXIUM) 40 MG capsule, Take 40 mg by mouth daily. , Disp: , Rfl:  .  Estradiol 10 MCG TABS vaginal tablet, Place vaginally as needed. , Disp: , Rfl:  .  fluticasone (FLONASE) 50 MCG/ACT nasal spray, Place 2 sprays into both nostrils daily as needed for allergies. , Disp: , Rfl:  .  levocetirizine (XYZAL) 5 MG tablet, Take 5 mg by mouth every evening. , Disp: , Rfl:  .  levothyroxine (SYNTHROID, LEVOTHROID) 150 MCG tablet, Take 150 mcg by mouth daily before breakfast., Disp: , Rfl:  .  LORazepam (ATIVAN) 0.5 MG tablet, Take 0.5 mg by mouth 3 (three) times daily. , Disp: , Rfl:  .  oxybutynin (DITROPAN-XL) 5 MG 24 hr tablet, Take by mouth., Disp: , Rfl:  .  traZODone (DESYREL) 100 MG tablet, Take 150 mg by mouth at bedtime. Take  1 and 1/2 tablet for a total of 150mg  daily, Disp: , Rfl:  .  vitamin B-12 (CYANOCOBALAMIN) 1000 MCG tablet, Take 1,000 mcg by mouth daily., Disp: , Rfl:  .  Vitamin D, Ergocalciferol, (DRISDOL) 50000 units CAPS capsule, Take 50,000 Units by mouth every 7 (seven) days., Disp: , Rfl:  .  Vortioxetine HBr (TRINTELLIX) 20 MG TABS, Take 20 mg by mouth daily., Disp: , Rfl:  .  Na Sulfate-K Sulfate-Mg Sulf 17.5-3.13-1.6 GM/177ML SOLN, Take 354 mLs by mouth once for 1 dose., Disp: 354 mL, Rfl: 0   Family  History  Problem Relation Age of Onset  . Breast cancer Maternal Aunt 70       deceased 52s  . Breast cancer Cousin 45       daughter of mat aunt with breast cancer  . Breast cancer Cousin 37       daughter of mat aunt with breast cancer  . Deep vein thrombosis Mother   . Heart disease Mother   . Stroke Mother   . Heart disease Father   . Parkinson's disease Father   . Heart disease Sister   . Stroke Sister   . Skin cancer Brother   . Heart disease Brother   . Alzheimer's disease Brother   . Lung cancer Paternal Uncle  deceased 54s  . Heart disease Brother   . Alzheimer's disease Brother   . Heart disease Brother   . Alzheimer's disease Brother   . Cancer Brother        unclear primary; currently 68s  . Cirrhosis Sister   . Alzheimer's disease Sister   . Kidney cancer Son 62       nephrectomy @ [facility]     Social History   Tobacco Use  . Smoking status: Never Smoker  . Smokeless tobacco: Never Used  Vaping Use  . Vaping Use: Never used  Substance Use Topics  . Alcohol use: No  . Drug use: No    Allergies as of 02/05/2020 - Review Complete 02/05/2020  Allergen Reaction Noted  . Amoxicillin Hives, Itching, and Other (See Comments) 12/19/2011  . Erythromycin Hives and Itching 12/19/2011  . Sulfa antibiotics Hives and Itching 12/19/2011  . Iodinated diagnostic agents Rash 02/04/2015  . Morphine Nausea And Vomiting and Other (See Comments) 12/19/2011    Review of Systems:    All systems reviewed and negative except where noted in HPI.   Physical Exam:  BP 135/79 (BP Location: Left Arm, Patient Position: Sitting, Cuff Size: Normal)   Pulse 96   Temp 98.3 F (36.8 C) (Oral)   Ht 5\' 7"  (1.702 m)   Wt 193 lb 6 oz (87.7 kg)   BMI 30.29 kg/m  No LMP recorded. Patient has had a hysterectomy.  General:   Alert,  Well-developed, well-nourished, pleasant and cooperative in NAD Head:  Normocephalic and atraumatic. Eyes:  Sclera clear, no icterus.   Conjunctiva  pink. Ears:  Normal auditory acuity. Nose:  No deformity, discharge, or lesions. Mouth:  No deformity or lesions,oropharynx pink & moist. Neck:  Supple; no masses or thyromegaly. Lungs:  Respirations even and unlabored.  Clear throughout to auscultation.   No wheezes, crackles, or rhonchi. No acute distress. Heart:  Regular rate and rhythm; no murmurs, clicks, rubs, or gallops. Abdomen:  Normal bowel sounds. Soft, obese, non-tender and non-distended without masses, hepatosplenomegaly or hernias noted.  No guarding or rebound tenderness.   Rectal: Not performed Msk:  Symmetrical without gross deformities. Good, equal movement & strength bilaterally. Pulses:  Normal pulses noted. Extremities:  No clubbing or edema.  No cyanosis. Neurologic:  Alert and oriented x3;  grossly normal neurologically. Skin:  Intact without significant lesions or rashes. No jaundice. Lymph Nodes:  No significant cervical adenopathy. Psych:  Alert and cooperative. Normal mood and affect.  Imaging Studies: Reviewed  Assessment and Plan:   ALICE BURNSIDE is a 69 y.o. Caucasian female with obesity, hypothyroidism, diabetes, chronic GERD is seen in consultation for chronically elevated LFTs, ultrasound revealed fatty liver  Elevated LFTs Recommend secondary liver disease work-up Discussed about lifestyle modification for management of fatty liver Liver biopsy and further treatment based on the above work-up  Chronic GERD Continue Nexium 40 mg daily Recommend EGD to evaluate for Barrett's given history of erosive esophagitis and her risk factors  Colon cancer screening, overdue Recommend screening colonoscopy  I have discussed alternative options, risks & benefits,  which include, but are not limited to, bleeding, infection, perforation,respiratory complication & drug reaction.  The patient agrees with this plan & written consent will be obtained.      Follow up in 2 to 3 months   Cephas Darby,  MD

## 2020-02-08 LAB — HEPATITIS A ANTIBODY, TOTAL: hep A Total Ab: NEGATIVE

## 2020-02-08 LAB — IRON,TIBC AND FERRITIN PANEL
Ferritin: 140 ng/mL (ref 15–150)
Iron Saturation: 9 % — CL (ref 15–55)
Iron: 32 ug/dL (ref 27–139)
Total Iron Binding Capacity: 356 ug/dL (ref 250–450)
UIBC: 324 ug/dL (ref 118–369)

## 2020-02-08 LAB — HEPATITIS B SURFACE ANTIGEN: Hepatitis B Surface Ag: NEGATIVE

## 2020-02-08 LAB — CERULOPLASMIN: Ceruloplasmin: 35.4 mg/dL (ref 19.0–39.0)

## 2020-02-08 LAB — MITOCHONDRIAL/SMOOTH MUSCLE AB PNL
Mitochondrial Ab: <20 Units (ref 0.0–20.0)
Smooth Muscle Ab: 4 Units (ref 0–19)

## 2020-02-08 LAB — HEPATITIS B SURFACE ANTIBODY,QUALITATIVE: Hep B Surface Ab, Qual: NONREACTIVE

## 2020-02-08 LAB — ALPHA-1-ANTITRYPSIN: A-1 Antitrypsin: 192 mg/dL — ABNORMAL HIGH (ref 101–187)

## 2020-02-08 LAB — HEPATITIS B CORE ANTIBODY, TOTAL: Hep B Core Total Ab: NEGATIVE

## 2020-02-08 LAB — HCV RNA QUANT: Hepatitis C Quantitation: NOT DETECTED IU/mL

## 2020-02-08 LAB — IGA: IgA/Immunoglobulin A, Serum: 211 mg/dL (ref 87–352)

## 2020-02-08 LAB — ANA: Anti Nuclear Antibody (ANA): NEGATIVE

## 2020-02-08 LAB — TISSUE TRANSGLUTAMINASE, IGA: Transglutaminase IgA: <2 U/mL (ref 0–3)

## 2020-02-11 ENCOUNTER — Telehealth: Payer: Self-pay

## 2020-02-11 NOTE — Telephone Encounter (Signed)
-----   Message from [name], MD sent at 02/10/2020 12:45 PM EDT ----- Please inform patient that the secondary liver disease work-up came back negative so far.  Her elevated liver enzymes are secondary to fatty liver  April Leblanc

## 2020-02-11 NOTE — Telephone Encounter (Signed)
Called patient and patient verbalized understanding of results  

## 2020-02-19 ENCOUNTER — Other Ambulatory Visit: Payer: Self-pay | Admitting: Oncology

## 2020-02-23 ENCOUNTER — Other Ambulatory Visit
Admission: RE | Admit: 2020-02-23 | Discharge: 2020-02-23 | Disposition: A | Discharge status: Home / Self Care | Payer: Medicare Other | Source: Ambulatory Visit | Attending: Gastroenterology | Admitting: Gastroenterology

## 2020-02-23 ENCOUNTER — Other Ambulatory Visit: Payer: Self-pay

## 2020-02-23 DIAGNOSIS — Z01812 Encounter for preprocedural laboratory examination: Secondary | ICD-10-CM | POA: Insufficient documentation

## 2020-02-23 DIAGNOSIS — Z20822 Contact with and (suspected) exposure to covid-19: Secondary | ICD-10-CM | POA: Insufficient documentation

## 2020-02-23 LAB — SARS CORONAVIRUS 2 (TAT 6-24 HRS): SARS Coronavirus 2: NEGATIVE

## 2020-02-25 ENCOUNTER — Encounter
Admission: RE | Disposition: A | Discharge status: Home / Self Care | Payer: Self-pay | Source: Home / Self Care | Attending: Gastroenterology

## 2020-02-25 ENCOUNTER — Other Ambulatory Visit: Payer: Self-pay

## 2020-02-25 ENCOUNTER — Ambulatory Visit
Admission: RE | Admit: 2020-02-25 | Discharge: 2020-02-25 | Disposition: A | Discharge status: Home / Self Care | Payer: Medicare Other | Attending: Gastroenterology | Admitting: Gastroenterology

## 2020-02-25 ENCOUNTER — Ambulatory Visit: Payer: Medicare Other | Admitting: Certified Registered Nurse Anesthetist

## 2020-02-25 ENCOUNTER — Encounter: Payer: Self-pay | Admitting: Gastroenterology

## 2020-02-25 DIAGNOSIS — K449 Diaphragmatic hernia without obstruction or gangrene: Secondary | ICD-10-CM | POA: Diagnosis not present

## 2020-02-25 DIAGNOSIS — Z13810 Encounter for screening for upper gastrointestinal disorder: Secondary | ICD-10-CM | POA: Insufficient documentation

## 2020-02-25 DIAGNOSIS — K209 Esophagitis, unspecified without bleeding: Secondary | ICD-10-CM

## 2020-02-25 DIAGNOSIS — Z853 Personal history of malignant neoplasm of breast: Secondary | ICD-10-CM | POA: Diagnosis not present

## 2020-02-25 DIAGNOSIS — K5520 Angiodysplasia of colon without hemorrhage: Secondary | ICD-10-CM | POA: Insufficient documentation

## 2020-02-25 DIAGNOSIS — K635 Polyp of colon: Secondary | ICD-10-CM | POA: Diagnosis not present

## 2020-02-25 DIAGNOSIS — D122 Benign neoplasm of ascending colon: Secondary | ICD-10-CM | POA: Diagnosis not present

## 2020-02-25 DIAGNOSIS — D123 Benign neoplasm of transverse colon: Secondary | ICD-10-CM | POA: Diagnosis not present

## 2020-02-25 DIAGNOSIS — Z1211 Encounter for screening for malignant neoplasm of colon: Secondary | ICD-10-CM | POA: Diagnosis not present

## 2020-02-25 DIAGNOSIS — Z923 Personal history of irradiation: Secondary | ICD-10-CM | POA: Insufficient documentation

## 2020-02-25 DIAGNOSIS — K644 Residual hemorrhoidal skin tags: Secondary | ICD-10-CM | POA: Insufficient documentation

## 2020-02-25 HISTORY — PX: COLONOSCOPY WITH PROPOFOL: SHX5780

## 2020-02-25 HISTORY — PX: ESOPHAGOGASTRODUODENOSCOPY (EGD) WITH PROPOFOL: SHX5813

## 2020-02-25 SURGERY — COLONOSCOPY WITH PROPOFOL
Anesthesia: General

## 2020-02-25 MED ORDER — PROPOFOL 500 MG/50ML IV EMUL
INTRAVENOUS | Status: AC
  Filled 2020-02-25: qty 50

## 2020-02-25 MED ORDER — PROPOFOL 10 MG/ML IV BOLUS
INTRAVENOUS | Status: DC | PRN
  Administered 2020-02-25: 60 mg via INTRAVENOUS

## 2020-02-25 MED ORDER — LIDOCAINE HCL (CARDIAC) PF 100 MG/5ML IV SOSY
PREFILLED_SYRINGE | INTRAVENOUS | Status: DC | PRN
  Administered 2020-02-25: 100 mg via INTRAVENOUS

## 2020-02-25 MED ORDER — SODIUM CHLORIDE 0.9 % IV SOLN: INTRAVENOUS | Status: DC

## 2020-02-25 MED ORDER — PROPOFOL 500 MG/50ML IV EMUL
INTRAVENOUS | Status: DC | PRN
  Administered 2020-02-25: 160 ug/kg/min via INTRAVENOUS

## 2020-02-25 NOTE — Op Note (Signed)
[facility] Gastroenterology Patient Name: April Leblanc Procedure Date: 02/25/2020 9:26 AM MRN: [id number] Account #: [id number] Date of Birth: [birth year] Admit Type: Outpatient Age: 69 Room: [facility] ENDO ROOM 4 Gender: Female Note Status: Finalized Procedure:             Colonoscopy Indications:           Screening for colorectal malignant neoplasm, Last                         colonoscopy: November 2011 Providers:             [name] MD, MD Medicines:             General Anesthesia Complications:         No immediate complications. Estimated blood loss: None. Procedure:             Pre-Anesthesia Assessment:                        - Prior to the procedure, a History and Physical was                         performed, and patient medications and allergies were                         reviewed. The patient is competent. The risks and                         benefits of the procedure and the sedation options and                         risks were discussed with the patient. All questions                         were answered and informed consent was obtained.                         Patient identification and proposed procedure were                         verified by the physician, the nurse, the                         anesthesiologist, the anesthetist and the technician                         in the pre-procedure area in the procedure room in the                         endoscopy suite. Mental Status Examination: alert and                         oriented. Airway Examination: normal oropharyngeal                         airway and neck mobility. Respiratory Examination:                         clear to auscultation. CV Examination: normal.  Prophylactic Antibiotics: The patient does not require                         prophylactic antibiotics. Prior Anticoagulants: The                         patient has taken no previous  anticoagulant or                         antiplatelet agents. ASA Grade Assessment: II - A                         patient with mild systemic disease. After reviewing                         the risks and benefits, the patient was deemed in                         satisfactory condition to undergo the procedure. The                         anesthesia plan was to use general anesthesia.                         Immediately prior to administration of medications,                         the patient was re-assessed for adequacy to receive                         sedatives. The heart rate, respiratory rate, oxygen                         saturations, blood pressure, adequacy of pulmonary                         ventilation, and response to care were monitored                         throughout the procedure. The physical status of the                         patient was re-assessed after the procedure.                        After obtaining informed consent, the colonoscope was                         passed under direct vision. Throughout the procedure,                         the patient's blood pressure, pulse, and oxygen                         saturations were monitored continuously. The                         Colonoscope was introduced through the anus and  advanced to the the cecum, identified by appendiceal                         orifice and ileocecal valve. The colonoscopy was                         performed without difficulty. The patient tolerated                         the procedure well. The quality of the bowel                         preparation was evaluated using the BBPS [facility] Bowel                         Preparation Scale) with scores of: Right Colon = 3,                         Transverse Colon = 3 and Left Colon = 3 (entire mucosa                         seen well with no residual staining, small fragments                         of stool or  opaque liquid). The total BBPS score                         equals 9. Findings:      The perianal and digital rectal examinations were normal. Pertinent       negatives include normal sphincter tone and no palpable rectal lesions.      A diminutive polyp was found in the ascending colon. The polyp was       sessile. The polyp was removed with a cold biopsy forceps. Resection and       retrieval were complete.      A 4 mm polyp was found in the transverse colon. The polyp was sessile.       The polyp was removed with a cold snare. Resection and retrieval were       complete.      The exam was otherwise without abnormality.      Non-bleeding external hemorrhoids were found during endoscopy. The       hemorrhoids were medium-sized.      A single small localized angiodysplastic lesion without bleeding was       found in the cecum. This was not treat as patient does not have anemia Impression:            - One diminutive polyp in the ascending colon, removed                         with a cold biopsy forceps. Resected and retrieved.                        - One 4 mm polyp in the transverse colon, removed with                         a cold snare. Resected and retrieved.                        -  The examination was otherwise normal.                        - Non-bleeding external hemorrhoids.                        - A single non-bleeding colonic angiodysplastic lesion. Recommendation:        - Discharge patient to home (with escort).                        - Resume previous diet today.                        - Continue present medications.                        - Await pathology results.                        - Repeat colonoscopy in 7 years for surveillance. Procedure Code(s):     --- Professional ---                        [phone], Colonoscopy, flexible; with removal of                         tumor(s), polyp(s), or other lesion(s) by snare                         technique                         45380, 88, Colonoscopy, flexible; with biopsy, single                         or multiple Diagnosis Code(s):     --- Professional ---                        Z12.11, Encounter for screening for malignant neoplasm                         of colon                        K63.5, Polyp of colon                        K64.4, Residual hemorrhoidal skin tags                        K55.20, Angiodysplasia of colon without hemorrhage CPT copyright 2019 American Medical Association. All rights reserved. The codes documented in this report are preliminary and upon coder review may  be revised to meet current compliance requirements. Dr. Ulyess Mort [name] MD, MD 02/25/2020 10:02:02 AM This report has been signed electronically. Number of Addenda: 0 Note Initiated On: 02/25/2020 9:26 AM Scope Withdrawal Time: 0 hours 11 minutes 23 seconds  Total Procedure Duration: 0 hours 14 minutes 13 seconds  Estimated Blood Loss:  Estimated blood loss: none.      [facility]

## 2020-02-25 NOTE — Anesthesia Procedure Notes (Signed)
Performed by: Demetrius Charity, CRNA Pre-anesthesia Checklist: Patient identified, Patient being monitored, Timeout performed, Emergency Drugs available and Suction available Patient Re-evaluated:Patient Re-evaluated prior to induction Preoxygenation: Pre-oxygenation with 100% oxygen Placement Confirmation: CO2 detector

## 2020-02-25 NOTE — Op Note (Signed)
[facility] Gastroenterology Patient Name: April Leblanc Procedure Date: 02/25/2020 9:26 AM MRN: [id number] Account #: [id number] Date of Birth: 04/08/1951 Admit Type: Outpatient Age: 69 Room: [facility] ENDO ROOM 4 Gender: Female Note Status: Finalized Procedure:             Upper GI endoscopy Indications:           Screening for Barrett's esophagus, Screening for                         Barrett's esophagus in patient at risk for this                         condition Providers:             [name] MD, MD Medicines:             General Anesthesia Complications:         No immediate complications. Estimated blood loss: None. Procedure:             Pre-Anesthesia Assessment:                        - Prior to the procedure, a History and Physical was                         performed, and patient medications and allergies were                         reviewed. The patient is competent. The risks and                         benefits of the procedure and the sedation options and                         risks were discussed with the patient. All questions                         were answered and informed consent was obtained.                         Patient identification and proposed procedure were                         verified by the physician, the nurse, the                         anesthesiologist, the anesthetist and the technician                         in the pre-procedure area in the procedure room in the                         endoscopy suite. Mental Status Examination: alert and                         oriented. Airway Examination: normal oropharyngeal                         airway and neck mobility. Respiratory Examination:  clear to auscultation. CV Examination: normal.                         Prophylactic Antibiotics: The patient does not require                         prophylactic antibiotics. Prior Anticoagulants:  The                         patient has taken no previous anticoagulant or                         antiplatelet agents. ASA Grade Assessment: II - A                         patient with mild systemic disease. After reviewing                         the risks and benefits, the patient was deemed in                         satisfactory condition to undergo the procedure. The                         anesthesia plan was to use general anesthesia.                         Immediately prior to administration of medications,                         the patient was re-assessed for adequacy to receive                         sedatives. The heart rate, respiratory rate, oxygen                         saturations, blood pressure, adequacy of pulmonary                         ventilation, and response to care were monitored                         throughout the procedure. The physical status of the                         patient was re-assessed after the procedure.                        After obtaining informed consent, the endoscope was                         passed under direct vision. Throughout the procedure,                         the patient's blood pressure, pulse, and oxygen                         saturations were monitored continuously. The Endoscope  was introduced through the mouth, and advanced to the                         second part of duodenum. The upper GI endoscopy was                         accomplished without difficulty. The patient tolerated                         the procedure well. Findings:      The esophagus, gastroesophageal junction and examined esophagus were       normal.      The stomach was normal.      The examined duodenum was normal.      A medium-sized hiatal hernia was present.      Esophagogastric landmarks were identified: the gastroesophageal junction       was found at 32 cm from the incisors. Impression:            - Normal  esophagus and gastroesophageal junction.                        - Normal stomach.                        - Normal examined duodenum.                        - Medium-sized hiatal hernia.                        - Esophagogastric landmarks identified.                        - No specimens collected. Recommendation:        - Discharge patient to home (with escort).                        - Resume previous diet today.                        - Follow an antireflux regimen.                        - Continue present medications. Procedure Code(s):     --- Professional ---                        [phone], Esophagogastroduodenoscopy, flexible,                         transoral; diagnostic, including collection of                         specimen(s) by brushing or washing, when performed                         (separate procedure) Diagnosis Code(s):     --- Professional ---                        K44.9, Diaphragmatic hernia without obstruction or  gangrene                        Z13.810, Encounter for screening for upper                         gastrointestinal disorder CPT copyright 2019 American Medical Association. All rights reserved. The codes documented in this report are preliminary and upon coder review may  be revised to meet current compliance requirements. Dr. Ulyess Mort [name] MD, MD 02/25/2020 9:42:22 AM This report has been signed electronically. Number of Addenda: 0 Note Initiated On: 02/25/2020 9:26 AM Estimated Blood Loss:  Estimated blood loss: none.      [facility]

## 2020-02-25 NOTE — Transfer of Care (Signed)
Immediate Anesthesia Transfer of Care Note  Patient: April Leblanc  Procedure(s) Performed: COLONOSCOPY WITH PROPOFOL (N/A ) ESOPHAGOGASTRODUODENOSCOPY (EGD) WITH PROPOFOL (N/A )  Patient Location: PACU  Anesthesia Type:General  Level of Consciousness: drowsy  Airway & Oxygen Therapy: Patient Spontanous Breathing and Patient connected to nasal cannula oxygen  Post-op Assessment: Report given to RN and Post -op Vital signs reviewed and stable  Post vital signs: Reviewed and stable  Last Vitals:  Vitals Value Taken Time  BP    Temp    Pulse 77 02/25/20 1004  Resp 18 02/25/20 1004  SpO2 98 % 02/25/20 1004  Vitals shown include unvalidated device data.  Last Pain:  Vitals:   02/25/20 0854  TempSrc: Temporal  PainSc: 0-No pain         Complications: No complications documented.

## 2020-02-25 NOTE — Anesthesia Postprocedure Evaluation (Signed)
Anesthesia Post Note  Patient: April Leblanc  Procedure(s) Performed: COLONOSCOPY WITH PROPOFOL (N/A ) ESOPHAGOGASTRODUODENOSCOPY (EGD) WITH PROPOFOL (N/A )  Patient location during evaluation: Endoscopy Anesthesia Type: General Level of consciousness: awake and alert Pain management: pain level controlled Vital Signs Assessment: post-procedure vital signs reviewed and stable Respiratory status: spontaneous breathing, nonlabored ventilation, respiratory function stable and patient connected to nasal cannula oxygen Cardiovascular status: blood pressure returned to baseline and stable Postop Assessment: no apparent nausea or vomiting Anesthetic complications: no   No complications documented.   Last Vitals:  Vitals:   02/25/20 1030 02/25/20 1040  BP: 128/64 137/77  Pulse: 83 79  Resp: 20 (!) 21  Temp:    SpO2: 99% 98%    Last Pain:  Vitals:   02/25/20 0900  TempSrc: Temporal  PainSc:                  Arita Miss

## 2020-02-25 NOTE — H&P (Signed)
Cephas Darby, MD [address]  [place], [place] [address]  Main: [phone]  Fax: [phone] Pager: [phone]  Primary Care Physician:  Gustavo Lah, MD Primary Gastroenterologist:  Dr. Cephas Darby  Pre-Procedure History & Physical: HPI:  April Leblanc is a 69 y.o. female is here for an endoscopy and colonoscopy.   Past Medical History:  Diagnosis Date   Anemia    Ankle sprain 03/2017   Arthritis    Breast cancer ([place]) 2018   Breast cancer ([place]) 03/2017   Depression    Fracture of distal end of radius 03/2017   left   Genetic testing 05/04/2017   Multi-Cancer panel (83 genes) @ [facility] - No pathogenic mutations detected   GERD (gastroesophageal reflux disease)    Headache    Hyperlipidemia    Hypothyroidism    Personal history of radiation therapy    Pre-diabetes 03/2017   follows healthy diabetic diet   Sleep apnea    has not used cpap in a long time   Vitamin B 12 deficiency     Past Surgical History:  Procedure Laterality Date   [place]   when she had c-section   BREAST BIOPSY Left 03/27/2017   INVASIVE MAMMARY CARCINOMA Grade 1 UIG   BREAST LUMPECTOMY Left 04/13/2017   INVASIVE MAMMARY CARCINOMA/ DCIS, CLEAR MARGINS, NEG. LN   CESAREAN SECTION     COLONOSCOPY     ESOPHAGOGASTRODUODENOSCOPY (EGD) WITH PROPOFOL N/A 02/05/2015   Procedure: ESOPHAGOGASTRODUODENOSCOPY (EGD) WITH PROPOFOL;  Surgeon: Josefine Class, MD;  Location: [facility] ENDOSCOPY;  Service: Endoscopy;  Laterality: N/A;   PARTIAL MASTECTOMY WITH NEEDLE LOCALIZATION Left 04/13/2017   Procedure: PARTIAL MASTECTOMY WITH NEEDLE LOCALIZATION;  Surgeon: Leonie Green, MD;  Location: [facility] ORS;  Service: General;  Laterality: Left;   PATELLA FRACTURE SURGERY Left 1991   screws in place   SAVORY DILATION N/A 02/05/2015   Procedure: SAVORY DILATION;  Surgeon: Josefine Class, MD;  Location[facility] ENDOSCOPY;   Service: Endoscopy;  Laterality: N/A;   SENTINEL NODE BIOPSY Left 04/13/2017   Procedure: SENTINEL NODE BIOPSY;  Surgeon: Leonie Green, MD;  Location: [facility] ORS;  Service: General;  Laterality: Left;   SINUS SURGERY WITH INSTATRAK      Prior to Admission medications   Medication Sig Start Date End Date Taking? Authorizing Provider  acetaminophen (TYLENOL) 500 MG tablet Take 1,000 mg by mouth every 6 (six) hours as needed for moderate pain or headache.   Yes [provider]  amphetamine-dextroamphetamine (ADDERALL XR) 10 MG 24 hr capsule Take 10 mg by mouth daily.   Yes [provider]  anastrozole (ARIMIDEX) 1 MG tablet TAKE 1 TABLET BY MOUTH EVERY DAY 02/19/20  Yes Sindy Guadeloupe, MD  ARIPiprazole (ABILIFY) 10 MG tablet Take 10 mg by mouth daily.   Yes [provider]  atorvastatin (LIPITOR) 40 MG tablet Take 40 mg by mouth daily.    Yes [provider]  esomeprazole (NEXIUM) 40 MG capsule Take 40 mg by mouth daily.    Yes [provider]  levocetirizine (XYZAL) 5 MG tablet Take 5 mg by mouth every evening.  06/21/18  Yes [provider]  levothyroxine (SYNTHROID, LEVOTHROID) 150 MCG tablet Take 150 mcg by mouth daily before breakfast.   Yes [provider]  LORazepam (ATIVAN) 0.5 MG tablet Take 0.5 mg by mouth 3 (three) times daily.    Yes [provider]  oxybutynin (DITROPAN-XL) 5 MG 24 hr tablet Take by mouth. 12/08/19 12/07/20 Yes [provider]  traZODone (DESYREL) 100 MG tablet Take 150 mg by mouth at bedtime. Take  1 and 1/2 tablet for a total of 150mg  daily   Yes [provider]  Vortioxetine HBr (TRINTELLIX) 20 MG TABS Take 20 mg by mouth daily.   Yes [provider]  darifenacin (ENABLEX) 15 MG 24 hr tablet Take 15 mg by mouth daily. Patient not taking: Reported on 02/25/2020    [provider]  Estradiol 10 MCG TABS vaginal tablet Place vaginally as needed.  Patient not  taking: Reported on 02/25/2020    [provider]  fluticasone (FLONASE) 50 MCG/ACT nasal spray Place 2 sprays into both nostrils daily as needed for allergies.  Patient not taking: Reported on 02/25/2020    [provider]  vitamin B-12 (CYANOCOBALAMIN) 1000 MCG tablet Take 1,000 mcg by mouth daily.    [provider]  Vitamin D, Ergocalciferol, (DRISDOL) 50000 units CAPS capsule Take 50,000 Units by mouth every 7 (seven) days.    [provider]    Allergies as of 02/05/2020 - Review Complete 02/05/2020  Allergen Reaction Noted   Amoxicillin Hives, Itching, and Other (See Comments) 12/19/2011   Erythromycin Hives and Itching 12/19/2011   Sulfa antibiotics Hives and Itching 12/19/2011   Iodinated diagnostic agents Rash 02/04/2015   Morphine Nausea And Vomiting and Other (See Comments) 12/19/2011    Family History  Problem Relation Age of Onset   Breast cancer Maternal Aunt 70       deceased 12s   Breast cancer Cousin 63       daughter of mat aunt with breast cancer   Breast cancer Cousin 49       daughter of mat aunt with breast cancer   Deep vein thrombosis Mother    Heart disease Mother    Stroke Mother    Heart disease Father    Parkinson's disease Father    Heart disease Sister    Stroke Sister    Skin cancer Brother    Heart disease Brother    Alzheimer's disease Brother    Lung cancer Paternal Uncle        deceased 37s   Heart disease Brother    Alzheimer's disease Brother    Heart disease Brother    Alzheimer's disease Brother    Cancer Brother        unclear primary; currently 78s   Cirrhosis Sister    Alzheimer's disease Sister    Kidney cancer Son 38       nephrectomy @ [facility]    Social History   Socioeconomic History   Marital status: Married    Spouse name: Not on file   Number of children: Not on file   Years of education: Not on file   Highest education level: Not on file  Occupational History   Not on file    Tobacco Use   Smoking status: Never Smoker   Smokeless tobacco: Never Used  Vaping Use   Vaping Use: Never used  Substance and Sexual Activity   Alcohol use: No   Drug use: No   Sexual activity: Not Currently  Other Topics Concern   Not on file  Social History Narrative   Not on file   Social Determinants of Health   Financial Resource Strain:    Difficulty of Paying Living Expenses: Not on file  Food Insecurity:  Worried About Charity fundraiser in the Last Year: Not on file   [employer] of Food in the Last Year: Not on file  Transportation Needs:    Lack of Transportation (Medical): Not on file   Lack of Transportation (Non-Medical): Not on file  Physical Activity:    Days of Exercise per Week: Not on file   Minutes of Exercise per Session: Not on file  Stress:    Feeling of Stress : Not on file  Social Connections:    Frequency of Communication with Friends and Family: Not on file   Frequency of Social Gatherings with Friends and Family: Not on file   Attends Religious Services: Not on file   Active Member of Clubs or Organizations: Not on file   Attends Archivist Meetings: Not on file   Marital Status: Not on file  Intimate Partner Violence:    Fear of Current or Ex-Partner: Not on file   Emotionally Abused: Not on file   Physically Abused: Not on file   Sexually Abused: Not on file    Review of Systems: See HPI, otherwise negative ROS  Physical Exam: BP (!) 143/92   Pulse 90   Temp (!) 96 F (35.6 C) (Temporal)   Resp 18   Ht 5\' 7"  (1.702 m)   Wt 189 lb (85.7 kg)   SpO2 100%   BMI 29.60 kg/m  General:   Alert,  pleasant and cooperative in NAD Head:  Normocephalic and atraumatic. Neck:  Supple; no masses or thyromegaly. Lungs:  Clear throughout to auscultation.    Heart:  Regular rate and rhythm. Abdomen:  Soft, nontender and nondistended. Normal bowel sounds, without guarding, and without rebound.   Neurologic:  Alert and  oriented x4;   grossly normal neurologically.  Impression/Plan: April Leblanc is here for an endoscopy and colonoscopy to be performed for barrett's screening and colon cancer screening  Risks, benefits, limitations, and alternatives regarding  endoscopy and colonoscopy have been reviewed with the patient.  Questions have been answered.  All parties agreeable.   Sherri Sear, MD  02/25/2020, 9:23 AM

## 2020-02-25 NOTE — Anesthesia Preprocedure Evaluation (Signed)
Anesthesia Evaluation  Patient identified by MRN, date of birth, ID band Patient awake    Reviewed: Allergy & Precautions, NPO status , Patient's Chart, lab work & pertinent test results  History of Anesthesia Complications Negative for: history of anesthetic complications  Airway Mallampati: III  TM Distance: >3 FB Neck ROM: Full    Dental no notable dental hx.    Pulmonary sleep apnea and Continuous Positive Airway Pressure Ventilation , neg COPD,    breath sounds clear to auscultation- rhonchi (-) wheezing      Cardiovascular (-) hypertension(-) CAD, (-) Past MI, (-) Cardiac Stents and (-) CABG  Rhythm:Regular Rate:Normal - Systolic murmurs and - Diastolic murmurs    Neuro/Psych  Headaches, neg Seizures PSYCHIATRIC DISORDERS Depression    GI/Hepatic Neg liver ROS, GERD  ,  Endo/Other  neg diabetesHypothyroidism   Renal/GU negative Renal ROS     Musculoskeletal  (+) Arthritis ,   Abdominal (+) + obese,   Peds  Hematology  (+) anemia ,   Anesthesia Other Findings Past Medical History: No date: Anemia 03/2017: Ankle sprain No date: Arthritis 2018: Breast cancer ([place]) 03/2017: Breast cancer ([place]) No date: Depression 03/2017: Fracture of distal end of radius     Comment:  left 05/04/2017: Genetic testing     Comment:  Multi-Cancer panel (83 genes) @ [facility] - No pathogenic               mutations detected No date: GERD (gastroesophageal reflux disease) No date: Headache No date: Hyperlipidemia No date: Hypothyroidism No date: Personal history of radiation therapy 03/2017: Pre-diabetes     Comment:  follows healthy diabetic diet No date: Sleep apnea     Comment:  has not used cpap in a long time No date: Vitamin B 12 deficiency   Reproductive/Obstetrics                             Anesthesia Physical Anesthesia Plan  ASA: II  Anesthesia Plan: General   Post-op Pain  Management:    Induction: Intravenous  PONV Risk Score and Plan: 2 and Propofol infusion  Airway Management Planned: Natural Airway  Additional Equipment:   Intra-op Plan:   Post-operative Plan:   Informed Consent: I have reviewed the patients History and Physical, chart, labs and discussed the procedure including the risks, benefits and alternatives for the proposed anesthesia with the patient or authorized representative who has indicated his/her understanding and acceptance.     Dental advisory given  Plan Discussed with: CRNA and Anesthesiologist  Anesthesia Plan Comments:         Anesthesia Quick Evaluation

## 2020-02-26 ENCOUNTER — Encounter: Payer: Self-pay | Admitting: Gastroenterology

## 2020-02-26 LAB — SURGICAL PATHOLOGY

## 2020-03-01 ENCOUNTER — Encounter: Payer: Self-pay | Admitting: Gastroenterology

## 2020-03-20 ENCOUNTER — Other Ambulatory Visit: Payer: Self-pay | Admitting: Oncology

## 2020-04-20 ENCOUNTER — Inpatient Hospital Stay: Payer: Medicare Other

## 2020-04-20 ENCOUNTER — Other Ambulatory Visit: Payer: Self-pay | Admitting: *Deleted

## 2020-04-20 ENCOUNTER — Encounter: Payer: Self-pay | Admitting: Oncology

## 2020-04-20 ENCOUNTER — Inpatient Hospital Stay: Payer: Medicare Other | Attending: Oncology | Admitting: Oncology

## 2020-04-20 VITALS — BP 132/76 | HR 83 | Temp 97.9°F | Resp 16 | Wt 194.1 lb

## 2020-04-20 DIAGNOSIS — Z79811 Long term (current) use of aromatase inhibitors: Secondary | ICD-10-CM | POA: Insufficient documentation

## 2020-04-20 DIAGNOSIS — Z853 Personal history of malignant neoplasm of breast: Secondary | ICD-10-CM | POA: Diagnosis not present

## 2020-04-20 DIAGNOSIS — Z170 Estrogen receptor positive status [ER+]: Secondary | ICD-10-CM | POA: Insufficient documentation

## 2020-04-20 DIAGNOSIS — M8580 Other specified disorders of bone density and structure, unspecified site: Secondary | ICD-10-CM | POA: Diagnosis not present

## 2020-04-20 DIAGNOSIS — R748 Abnormal levels of other serum enzymes: Secondary | ICD-10-CM | POA: Diagnosis not present

## 2020-04-20 DIAGNOSIS — E785 Hyperlipidemia, unspecified: Secondary | ICD-10-CM | POA: Insufficient documentation

## 2020-04-20 DIAGNOSIS — G4730 Sleep apnea, unspecified: Secondary | ICD-10-CM | POA: Insufficient documentation

## 2020-04-20 DIAGNOSIS — K219 Gastro-esophageal reflux disease without esophagitis: Secondary | ICD-10-CM | POA: Diagnosis not present

## 2020-04-20 DIAGNOSIS — C50412 Malignant neoplasm of upper-outer quadrant of left female breast: Secondary | ICD-10-CM | POA: Diagnosis present

## 2020-04-20 DIAGNOSIS — Z803 Family history of malignant neoplasm of breast: Secondary | ICD-10-CM | POA: Diagnosis not present

## 2020-04-20 DIAGNOSIS — Z08 Encounter for follow-up examination after completed treatment for malignant neoplasm: Secondary | ICD-10-CM | POA: Diagnosis not present

## 2020-04-20 DIAGNOSIS — Z79899 Other long term (current) drug therapy: Secondary | ICD-10-CM | POA: Insufficient documentation

## 2020-04-20 DIAGNOSIS — Z5181 Encounter for therapeutic drug level monitoring: Secondary | ICD-10-CM

## 2020-04-20 DIAGNOSIS — M1990 Unspecified osteoarthritis, unspecified site: Secondary | ICD-10-CM | POA: Diagnosis not present

## 2020-04-20 DIAGNOSIS — Z923 Personal history of irradiation: Secondary | ICD-10-CM | POA: Diagnosis not present

## 2020-04-20 LAB — COMPREHENSIVE METABOLIC PANEL
ALT: 58 U/L — ABNORMAL HIGH (ref 0–44)
AST: 59 U/L — ABNORMAL HIGH (ref 15–41)
Albumin: 4.1 g/dL (ref 3.5–5.0)
Alkaline Phosphatase: 169 U/L — ABNORMAL HIGH (ref 38–126)
Anion gap: 10 (ref 5–15)
BUN: 18 mg/dL (ref 8–23)
CO2: 28 mmol/L (ref 22–32)
Calcium: 9.7 mg/dL (ref 8.9–10.3)
Chloride: 99 mmol/L (ref 98–111)
Creatinine, Ser: 0.76 mg/dL (ref 0.44–1.00)
GFR, Estimated: >60 mL/min (ref >60)
Glucose, Bld: 211 mg/dL — ABNORMAL HIGH (ref 70–99)
Potassium: 4.2 mmol/L (ref 3.5–5.1)
Sodium: 137 mmol/L (ref 135–145)
Total Bilirubin: 0.6 mg/dL (ref 0.3–1.2)
Total Protein: 7.7 g/dL (ref 6.5–8.1)

## 2020-04-20 NOTE — Progress Notes (Signed)
Pt here for follow up. No new concerns or new breast problems.

## 2020-04-21 NOTE — Progress Notes (Signed)
Hematology/Oncology Consult note [facility]  [phone] [phone]  Patient Care Team: Gustavo Lah, MD as PCP - General (Student) Noreene Filbert, MD as Radiation Oncologist (Radiation Oncology)   Name of the patient: April Leblanc  [id number]  23-Feb-1951   Date of visit: 04/21/20  Diagnosis- invasive mammary of the left breast stage I acT1b cN0 cM0ER greater than 90% positive, PR 1% positive and HER-2/neu equivocal on [facility] negative  Chief complaint/ Reason for visit- routine f/u of breast cancer on arimidex  Heme/Onc history: Patient is a 69 year old female with left breast cancer diagnosed in this 2018 stage I. 6 mm tumor with negative lymph nodes that was ER/PR positive and negative. Patient did not adjuvant chemotherapy and went on to receive adjuvant radiation treatment and started taking Arimidex in march 2019. She gets reclast for osteopenia  Interval history-tolerating Arimidex along with calcium and vitamin D well without any significant side effects.  Reports no significant arthralgias mood swings or hot flashes  ECOG PS- 1 Pain scale- 0   Review of systems- Review of Systems  Constitutional: Negative for chills, fever, malaise/fatigue and weight loss.  HENT: Negative for congestion, ear discharge and nosebleeds.   Eyes: Negative for blurred vision.  Respiratory: Negative for cough, hemoptysis, sputum production, shortness of breath and wheezing.   Cardiovascular: Negative for chest pain, palpitations, orthopnea and claudication.  Gastrointestinal: Negative for abdominal pain, blood in stool, constipation, diarrhea, heartburn, melena, nausea and vomiting.  Genitourinary: Negative for dysuria, flank pain, frequency, hematuria and urgency.  Musculoskeletal: Negative for back pain, joint pain and myalgias.  Skin: Negative for rash.  Neurological: Negative for dizziness, tingling, focal weakness, seizures,  weakness and headaches.  Endo/Heme/Allergies: Does not bruise/bleed easily.  Psychiatric/Behavioral: Negative for depression and suicidal ideas. The patient does not have insomnia.       Allergies  Allergen Reactions  . Amoxicillin Hives, Itching and Other (See Comments)    Has patient had a PCN reaction causing immediate rash, facial/tongue/throat swelling, SOB or lightheadedness with hypotension: No Has patient had a PCN reaction causing severe rash involving mucus membranes or skin necrosis: No Has patient had a PCN reaction that required hospitalization: No Has patient had a PCN reaction occurring within the last 10 years: No If all of the above answers are "NO", then may proceed with Cephalosporin use.   . Erythromycin Hives and Itching  . Sulfa Antibiotics Hives and Itching  . Iodinated Diagnostic Agents Rash    Red rash and itching. Topical iodine not a problem.  . Morphine Nausea And Vomiting and Other (See Comments)    Pretty severe vomiting. Can take hydrocodone and oxycodone       Past Medical History:  Diagnosis Date  . Anemia   . Ankle sprain 03/2017  . Arthritis   . Breast cancer ([place]) 2018  . Breast cancer ([place]) 03/2017  . Depression   . Fracture of distal end of radius 03/2017   left  . Genetic testing 05/04/2017   Multi-Cancer panel (83 genes) @ [facility] - No pathogenic mutations detected  . GERD (gastroesophageal reflux disease)   . Headache   . Hyperlipidemia   . Hypothyroidism   . Personal history of radiation therapy   . Pre-diabetes 03/2017   follows healthy diabetic diet  . Sleep apnea    has not used cpap in a long time  . Vitamin B 12 deficiency      Past Surgical History:  Procedure Laterality  Date  . ABDOMINAL HYSTERECTOMY  1983  . APPENDECTOMY  1977   when she had c-section  . BREAST BIOPSY Left 03/27/2017   INVASIVE MAMMARY CARCINOMA Grade 1 UIG  . BREAST LUMPECTOMY Left 04/13/2017   INVASIVE MAMMARY CARCINOMA/ DCIS, CLEAR  MARGINS, NEG. LN  . CESAREAN SECTION    . COLONOSCOPY    . COLONOSCOPY WITH PROPOFOL N/A 02/25/2020   Procedure: COLONOSCOPY WITH PROPOFOL;  Surgeon: [name], MD;  Location: [facility] ENDOSCOPY;  Service: Gastroenterology;  Laterality: N/A;  . ESOPHAGOGASTRODUODENOSCOPY (EGD) WITH PROPOFOL N/A 02/05/2015   Procedure: ESOPHAGOGASTRODUODENOSCOPY (EGD) WITH PROPOFOL;  Surgeon: Josefine Class, MD;  Location: [facility] ENDOSCOPY;  Service: Endoscopy;  Laterality: N/A;  . ESOPHAGOGASTRODUODENOSCOPY (EGD) WITH PROPOFOL N/A 02/25/2020   Procedure: ESOPHAGOGASTRODUODENOSCOPY (EGD) WITH PROPOFOL;  Surgeon: [name], MD;  Location: [facility] ENDOSCOPY;  Service: Gastroenterology;  Laterality: N/A;  . PARTIAL MASTECTOMY WITH NEEDLE LOCALIZATION Left 04/13/2017   Procedure: PARTIAL MASTECTOMY WITH NEEDLE LOCALIZATION;  Surgeon: Leonie Green, MD;  Location: [facility] ORS;  Service: General;  Laterality: Left;  . PATELLA FRACTURE SURGERY Left 1991   screws in place  . SAVORY DILATION N/A 02/05/2015   Procedure: SAVORY DILATION;  Surgeon: Josefine Class, MD;  Location: [facility] ENDOSCOPY;  Service: Endoscopy;  Laterality: N/A;  . SENTINEL NODE BIOPSY Left 04/13/2017   Procedure: SENTINEL NODE BIOPSY;  Surgeon: Leonie Green, MD;  Location: [facility] ORS;  Service: General;  Laterality: Left;  . SINUS SURGERY WITH INSTATRAK      Social History   Socioeconomic History  . Marital status: Married    Spouse name: Not on file  . Number of children: Not on file  . Years of education: Not on file  . Highest education level: Not on file  Occupational History  . Not on file  Tobacco Use  . Smoking status: Never Smoker  . Smokeless tobacco: Never Used  Vaping Use  . Vaping Use: Never used  Substance and Sexual Activity  . Alcohol use: No  . Drug use: No  . Sexual activity: Not Currently  Other Topics Concern  . Not on file  Social History Narrative  . Not on file   Social  Determinants of Health   Financial Resource Strain: Not on file  Food Insecurity: Not on file  Transportation Needs: Not on file  Physical Activity: Not on file  Stress: Not on file  Social Connections: Not on file  Intimate Partner Violence: Not on file    Family History  Problem Relation Age of Onset  . Breast cancer Maternal Aunt 70       deceased 26s  . Breast cancer Cousin 55       daughter of mat aunt with breast cancer  . Breast cancer Cousin 34       daughter of mat aunt with breast cancer  . Deep vein thrombosis Mother   . Heart disease Mother   . Stroke Mother   . Heart disease Father   . Parkinson's disease Father   . Heart disease Sister   . Stroke Sister   . Skin cancer Brother   . Heart disease Brother   . Alzheimer's disease Brother   . Lung cancer Paternal Uncle        deceased 41s  . Heart disease Brother   . Alzheimer's disease Brother   . Heart disease Brother   . Alzheimer's disease Brother   . Cancer Brother  unclear primary; currently 64s  . Cirrhosis Sister   . Alzheimer's disease Sister   . Kidney cancer Son 33       nephrectomy @ [facility]     Current Outpatient Medications:  .  acetaminophen (TYLENOL) 500 MG tablet, Take 1,000 mg by mouth every 6 (six) hours as needed for moderate pain or headache., Disp: , Rfl:  .  amphetamine-dextroamphetamine (ADDERALL XR) 10 MG 24 hr capsule, Take 10 mg by mouth daily., Disp: , Rfl:  .  anastrozole (ARIMIDEX) 1 MG tablet, TAKE 1 TABLET BY MOUTH EVERY DAY, Disp: 90 tablet, Rfl: 0 .  ARIPiprazole (ABILIFY) 10 MG tablet, Take 10 mg by mouth daily., Disp: , Rfl:  .  atorvastatin (LIPITOR) 40 MG tablet, Take 40 mg by mouth daily. , Disp: , Rfl:  .  esomeprazole (NEXIUM) 40 MG capsule, Take 40 mg by mouth daily. , Disp: , Rfl:  .  Estradiol 10 MCG TABS vaginal tablet, Place vaginally as needed., Disp: , Rfl:  .  fluticasone (FLONASE) 50 MCG/ACT nasal spray, Place 2 sprays into both nostrils daily as needed  for allergies., Disp: , Rfl:  .  levocetirizine (XYZAL) 5 MG tablet, Take 5 mg by mouth every evening. , Disp: , Rfl:  .  levothyroxine (SYNTHROID, LEVOTHROID) 150 MCG tablet, Take 150 mcg by mouth daily before breakfast., Disp: , Rfl:  .  LORazepam (ATIVAN) 0.5 MG tablet, Take 0.5 mg by mouth 3 (three) times daily., Disp: , Rfl:  .  oxybutynin (DITROPAN-XL) 5 MG 24 hr tablet, Take by mouth., Disp: , Rfl:  .  traZODone (DESYREL) 100 MG tablet, Take 150 mg by mouth at bedtime. Take  1 and 1/2 tablet for a total of 164m daily, Disp: , Rfl:  .  vitamin B-12 (CYANOCOBALAMIN) 1000 MCG tablet, Take 1,000 mcg by mouth daily., Disp: , Rfl:  .  Vitamin D, Ergocalciferol, (DRISDOL) 50000 units CAPS capsule, Take 50,000 Units by mouth every 7 (seven) days., Disp: , Rfl:  .  vortioxetine HBr (TRINTELLIX) 20 MG TABS tablet, Take 20 mg by mouth daily., Disp: , Rfl:   Physical exam:  Vitals:   04/20/20 1037  BP: 132/76  Pulse: 83  Resp: 16  Temp: 97.9 F (36.6 C)  TempSrc: Tympanic  SpO2: 98%  Weight: 194 lb 1.6 oz (88 kg)   Physical Exam Constitutional:      General: She is not in acute distress. Eyes:     Extraocular Movements: EOM normal.     Pupils: Pupils are equal, round, and reactive to light.  Cardiovascular:     Rate and Rhythm: Normal rate and regular rhythm.     Heart sounds: Normal heart sounds.  Pulmonary:     Effort: Pulmonary effort is normal.     Breath sounds: Normal breath sounds.  Abdominal:     General: Bowel sounds are normal.     Palpations: Abdomen is soft.  Skin:    General: Skin is warm and dry.  Neurological:     Mental Status: She is alert and oriented to person, place, and time.    Breast exam was performed in seated and lying down position. Patient is status post left lumpectomy with a well-healed surgical scar. No evidence of any palpable masses. No evidence of axillary adenopathy. No evidence of any palpable masses or lumps in the right breast. No evidence  of right axillary adenopathy   CMP Latest Ref Rng & Units 04/20/2020  Glucose 70 - 99 mg/dL 211(H)  BUN 8 - 23 mg/dL 18  Creatinine 0.44 - 1.00 mg/dL 0.76  Sodium 135 - 145 mmol/L 137  Potassium 3.5 - 5.1 mmol/L 4.2  Chloride 98 - 111 mmol/L 99  CO2 22 - 32 mmol/L 28  Calcium 8.9 - 10.3 mg/dL 9.7  Total Protein 6.5 - 8.1 g/dL 7.7  Total Bilirubin 0.3 - 1.2 mg/dL 0.6  Alkaline Phos 38 - 126 U/L 169(H)  AST 15 - 41 U/L 59(H)  ALT 0 - 44 U/L 58(H)   CBC Latest Ref Rng & Units 10/24/2019  WBC 4.0 - 10.5 K/uL 12.2(H)  Hemoglobin 12.0 - 15.0 g/dL 13.5  Hematocrit 36.0 - 46.0 % 40.3  Platelets 150 - 400 K/uL 225    No images are attached to the encounter.  No results found.   Assessment and plan- Patient is a 69 y.o. 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 This is a routine follow-up visit for breast cancer  Clinically patient is doing well with no concerning signs and symptoms of recurrence based on today's exam.  We will be scheduling a mammogram for her this month.  I will see her back in 6 months with CBC with differential and CMP.  She will continue to take Arimidex and calcium with vitamin D.  Patient has known osteopenia and had a repeat bone density scan in July 2021 which actually showed improvement in her T-scores at the [facility] after receiving Reclast.  She last received it in July 2021 and we are planning to give it every 18 months.   Visit Diagnosis 1. Elevated liver enzymes   2. Encounter for follow-up surveillance of breast cancer   3. Visit for monitoring Arimidex therapy      Dr. Randa Evens, MD, MPH [facility] at [facility] [phone] 04/21/2020 4:10 PM

## 2020-05-04 ENCOUNTER — Encounter: Payer: Self-pay | Admitting: Gastroenterology

## 2020-05-04 ENCOUNTER — Ambulatory Visit (INDEPENDENT_AMBULATORY_CARE_PROVIDER_SITE_OTHER): Payer: Medicare Other | Admitting: Gastroenterology

## 2020-05-04 VITALS — BP 152/88 | HR 84 | Temp 98.2°F | Ht 67.0 in | Wt 195.5 lb

## 2020-05-04 DIAGNOSIS — K760 Fatty (change of) liver, not elsewhere classified: Secondary | ICD-10-CM

## 2020-05-04 NOTE — Progress Notes (Signed)
Cephas Darby, MD [address]  [place]  [place], [address] [address]  Main: [phone]  Fax: [phone]    Gastroenterology Consultation  Referring Provider:     Gustavo Lah, MD Primary Care Physician:  Gustavo Lah, MD Primary Gastroenterologist:  Dr. Cephas Darby Reason for Consultation:     Elevated LFTs        HPI:   April Leblanc is a 70 y.o. female referred by Dr. Arlington Calix, Fanny Dance, MD  for consultation & management of elevated LFTs.  Patient has history of obesity, hypothyroidism, hyperlipidemia, chronic GERD, diabetes is noted to have elevated alkaline phosphatase since 01/2012.  Most recently, she is also found to have elevated AST and ALT, 45/65 in 09/2019, increased to 64/79 in 10/2019.  Unknown hepatitis B and C status.  ANA negative, ceruloplasmin levels were normal in the past.  Right upper quadrant ultrasound revealed gallbladder polyp as well as fatty liver. Patient has history of chronic GERD, underwent EGD in 01/2015 by Dr. Rayann Heman, found to have LA grade B esophagitis and medium size hiatal hernia.  Patient has been on Nexium 40 mg daily since then.  She remains asymptomatic on Nexium.  She denies any lower GI symptoms. She reports having blood transfusion several years ago.  She denies history of IV drug abuse or any other high risk behaviors.  She lost about 7 pounds by following healthy diet.  She has cut back on snacking.  She does drink diet soda a few times a week.  Follow-up visit 05/04/2020 Patient denies any GI symptoms today.  She underwent work-up for elevated liver enzymes.  Secondary liver disease work-up is negative.  Patient reports that she has not been maintaining healthy diet during holidays.  She said she has slacked off on it.  NSAIDs: None  Antiplts/Anticoagulants/Anti thrombotics: None  Patient did not undergo colonoscopy in the past GI Procedures: EGD 02/05/2015 - Medium-sized hiatus hernia. - LA Grade A reflux  esophagitis. Biopsied. - Discolored, granular mucosa in the esophagus. Biopsied. - Normal stomach. - Normal examined duodenum. DIAGNOSIS:  A. GEJ; COLD BIOPSY:  - SQUAMOCOLUMNAR MUCOSA WITH MILD GASTRITIS, COMPATIBLE WITH REFLUX.  - NEGATIVE FOR DYSPLASIA AND MALIGNANCY.   B. ESOPHAGUS ABNORMAL MUCOSA, AT 17 CM; COLD BIOPSY:  - REFLUX GASTROESOPHAGITIS.  - NEGATIVE FOR DYSPLASIA AND MALIGNANCY.  EGD and colonoscopy 02/25/2020 - Normal esophagus and gastroesophageal junction. - Normal stomach. - Normal examined duodenum. - Medium-sized hiatal hernia. - Esophagogastric landmarks identified. - No specimens collected.  - One diminutive polyp in the ascending colon, removed with a cold biopsy forceps. Resected and retrieved. - One 4 mm polyp in the transverse colon, removed with a cold snare. Resected and retrieved. - The examination was otherwise normal. - Non-bleeding external hemorrhoids. - A single non-bleeding colonic angiodysplastic lesion.  DIAGNOSIS:  A. COLON POLYP, ASCENDING; COLD BIOPSY:  - TUBULAR ADENOMA.  - NEGATIVE FOR HIGH-GRADE DYSPLASIA AND MALIGNANCY.   B. COLON POLYP, TRANSVERSE; COLD SNARE:  - TUBULAR ADENOMA.  - NEGATIVE FOR HIGH-GRADE DYSPLASIA AND MALIGNANCY.   Past Medical History:  Diagnosis Date  . Anemia   . Ankle sprain 03/2017  . Arthritis   . Breast cancer ([place]) 2018  . Breast cancer ([place]) 03/2017  . Depression   . Fracture of distal end of radius 03/2017   left  . Genetic testing 05/04/2017   Multi-Cancer panel (83 genes) @ [facility] - No pathogenic mutations detected  . GERD (gastroesophageal reflux disease)   .  Headache   . Hyperlipidemia   . Hypothyroidism   . Personal history of radiation therapy   . Pre-diabetes 03/2017   follows healthy diabetic diet  . Sleep apnea    has not used cpap in a long time  . Vitamin B 12 deficiency     Past Surgical History:  Procedure Laterality Date  . ABDOMINAL HYSTERECTOMY  1983  .  APPENDECTOMY  1977   when she had c-section  . BREAST BIOPSY Left 03/27/2017   INVASIVE MAMMARY CARCINOMA Grade 1 UIG  . BREAST LUMPECTOMY Left 04/13/2017   INVASIVE MAMMARY CARCINOMA/ DCIS, CLEAR MARGINS, NEG. LN  . CESAREAN SECTION    . COLONOSCOPY    . COLONOSCOPY WITH PROPOFOL N/A 02/25/2020   Procedure: COLONOSCOPY WITH PROPOFOL;  Surgeon: Toney Reil, MD;  Location: [facility] ENDOSCOPY;  Service: Gastroenterology;  Laterality: N/A;  . ESOPHAGOGASTRODUODENOSCOPY (EGD) WITH PROPOFOL N/A 02/05/2015   Procedure: ESOPHAGOGASTRODUODENOSCOPY (EGD) WITH PROPOFOL;  Surgeon: Elnita Maxwell, MD;  Location[facility] ENDOSCOPY;  Service: Endoscopy;  Laterality: N/A;  . ESOPHAGOGASTRODUODENOSCOPY (EGD) WITH PROPOFOL N/A 02/25/2020   Procedure: ESOPHAGOGASTRODUODENOSCOPY (EGD) WITH PROPOFOL;  Surgeon: Toney Reil, MD;  Location: [facility] ENDOSCOPY;  Service: Gastroenterology;  Laterality: N/A;  . PARTIAL MASTECTOMY WITH NEEDLE LOCALIZATION Left 04/13/2017   Procedure: PARTIAL MASTECTOMY WITH NEEDLE LOCALIZATION;  Surgeon: Nadeen Landau, MD;  Location: [facility] ORS;  Service: General;  Laterality: Left;  . PATELLA FRACTURE SURGERY Left 1991   screws in place  . SAVORY DILATION N/A 02/05/2015   Procedure: SAVORY DILATION;  Surgeon: Elnita Maxwell, MD;  Location: [facility] ENDOSCOPY;  Service: Endoscopy;  Laterality: N/A;  . SENTINEL NODE BIOPSY Left 04/13/2017   Procedure: SENTINEL NODE BIOPSY;  Surgeon: Nadeen Landau, MD;  Location: [facility] ORS;  Service: General;  Laterality: Left;  . SINUS SURGERY WITH INSTATRAK      Current Outpatient Medications:  .  acetaminophen (TYLENOL) 500 MG tablet, Take 1,000 mg by mouth every 6 (six) hours as needed for moderate pain or headache., Disp: , Rfl:  .  amphetamine-dextroamphetamine (ADDERALL XR) 10 MG 24 hr capsule, Take 10 mg by mouth daily., Disp: , Rfl:  .  anastrozole (ARIMIDEX) 1 MG tablet, TAKE 1 TABLET BY MOUTH EVERY DAY, Disp: 90 tablet,  Rfl: 0 .  atorvastatin (LIPITOR) 40 MG tablet, Take 40 mg by mouth daily. , Disp: , Rfl:  .  esomeprazole (NEXIUM) 40 MG capsule, Take 40 mg by mouth daily. , Disp: , Rfl:  .  Estradiol 10 MCG TABS vaginal tablet, Place vaginally as needed., Disp: , Rfl:  .  fluticasone (FLONASE) 50 MCG/ACT nasal spray, Place 2 sprays into both nostrils daily as needed for allergies., Disp: , Rfl:  .  levocetirizine (XYZAL) 5 MG tablet, Take 5 mg by mouth every evening. , Disp: , Rfl:  .  levothyroxine (SYNTHROID, LEVOTHROID) 150 MCG tablet, Take 150 mcg by mouth daily before breakfast., Disp: , Rfl:  .  LORazepam (ATIVAN) 0.5 MG tablet, Take 0.5 mg by mouth 3 (three) times daily., Disp: , Rfl:  .  oxybutynin (DITROPAN-XL) 5 MG 24 hr tablet, Take by mouth., Disp: , Rfl:  .  traZODone (DESYREL) 100 MG tablet, Take 150 mg by mouth at bedtime. Take  1 and 1/2 tablet for a total of 150mg  daily, Disp: , Rfl:  .  vitamin B-12 (CYANOCOBALAMIN) 1000 MCG tablet, Take 1,000 mcg by mouth daily., Disp: , Rfl:  .  Vitamin D, Ergocalciferol, (DRISDOL) 50000 units CAPS  capsule, Take 50,000 Units by mouth every 7 (seven) days., Disp: , Rfl:  .  vortioxetine HBr (TRINTELLIX) 20 MG TABS tablet, Take 20 mg by mouth daily., Disp: , Rfl:  .  ARIPiprazole (ABILIFY) 15 MG tablet, Take 15 mg by mouth daily., Disp: , Rfl:    Family History  Problem Relation Age of Onset  . Breast cancer Maternal Aunt 70       deceased 25s  . Breast cancer Cousin 41       daughter of mat aunt with breast cancer  . Breast cancer Cousin 17       daughter of mat aunt with breast cancer  . Deep vein thrombosis Mother   . Heart disease Mother   . Stroke Mother   . Heart disease Father   . Parkinson's disease Father   . Heart disease Sister   . Stroke Sister   . Skin cancer Brother   . Heart disease Brother   . Alzheimer's disease Brother   . Lung cancer Paternal Uncle        deceased 68s  . Heart disease Brother   . Alzheimer's disease Brother    . Heart disease Brother   . Alzheimer's disease Brother   . Cancer Brother        unclear primary; currently 46s  . Cirrhosis Sister   . Alzheimer's disease Sister   . Kidney cancer Son 15       nephrectomy @ [facility]     Social History   Tobacco Use  . Smoking status: Never Smoker  . Smokeless tobacco: Never Used  Vaping Use  . Vaping Use: Never used  Substance Use Topics  . Alcohol use: No  . Drug use: No    Allergies as of 05/04/2020 - Review Complete 05/04/2020  Allergen Reaction Noted  . Amoxicillin Hives, Itching, and Other (See Comments) 12/19/2011  . Erythromycin Hives and Itching 12/19/2011  . Sulfa antibiotics Hives and Itching 12/19/2011  . Iodinated diagnostic agents Rash 02/04/2015  . Morphine Nausea And Vomiting and Other (See Comments) 12/19/2011    Review of Systems:    All systems reviewed and negative except where noted in HPI.   Physical Exam:  BP (!) 152/88 (BP Location: Left Arm, Patient Position: Sitting, Cuff Size: Normal)   Pulse 84   Temp 98.2 F (36.8 C) (Oral)   Ht 5\' 7"  (1.702 m)   Wt 195 lb 8 oz (88.7 kg)   BMI 30.62 kg/m  No LMP recorded. Patient has had a hysterectomy.  General:   Alert,  Well-developed, well-nourished, pleasant and cooperative in NAD Head:  Normocephalic and atraumatic. Eyes:  Sclera clear, no icterus.   Conjunctiva pink. Ears:  Normal auditory acuity. Nose:  No deformity, discharge, or lesions. Mouth:  No deformity or lesions,oropharynx pink & moist. Neck:  Supple; no masses or thyromegaly. Lungs:  Respirations even and unlabored.  Clear throughout to auscultation.   No wheezes, crackles, or rhonchi. No acute distress. Heart:  Regular rate and rhythm; no murmurs, clicks, rubs, or gallops. Abdomen:  Normal bowel sounds. Soft, obese, non-tender and non-distended without masses, hepatosplenomegaly or hernias noted.  No guarding or rebound tenderness.   Rectal: Not performed Msk:  Symmetrical without gross  deformities. Good, equal movement & strength bilaterally. Pulses:  Normal pulses noted. Extremities:  No clubbing or edema.  No cyanosis. Neurologic:  Alert and oriented x3;  grossly normal neurologically. Skin:  Intact without significant lesions or rashes. No jaundice. Lymph Nodes:  No significant cervical adenopathy. Psych:  Alert and cooperative. Normal mood and affect.  Imaging Studies: Reviewed  Assessment and Plan:   April Leblanc is a 70 y.o. Caucasian female with obesity, hypothyroidism, diabetes, chronic GERD is seen in consultation for chronically elevated LFTs, ultrasound revealed fatty liver  Elevated LFTs: Secondary to fatty liver disease Secondary liver disease work-up is negative Recheck LFTs today, if persistently elevated or worsening, recommend liver biopsy and patient is agreeable Discussed in length regarding fatty liver disease, progression if left untreated, management by following healthy diet and physical activity, information provided  Chronic GERD Continue Nexium 40 mg daily EGD did not reveal any evidence of Barrett's.  Given history of medium size hiatal hernia, recommend long-term PPI  Tubular adenomas of the colon Recommend surveillance colonoscopy in 01/2027  I have discussed alternative options, risks & benefits,  which include, but are not limited to, bleeding, infection, perforation,respiratory complication & drug reaction.  The patient agrees with this plan & written consent will be obtained.    Follow up in  3 months   Cephas Darby, MD

## 2020-05-04 NOTE — Patient Instructions (Signed)
Nonalcoholic Fatty Liver Disease Diet, Adult Nonalcoholic fatty liver disease is a condition that causes fat to build up in and around the liver. The disease makes it harder for the liver to work the way that it should. Following a healthy diet can help to keep nonalcoholic fatty liver disease under control. It can also help to prevent or improve conditions that are associated with the disease, such as heart disease, diabetes, high blood pressure, and abnormal cholesterol levels. Along with regular exercise, this diet: Promotes weight loss. Helps to control blood sugar levels. Helps to improve the way that the body uses insulin. What are tips for following this plan? Reading food labels Always check food labels for: The amount of saturated fat in a food. You should limit your intake of saturated fat. Saturated fat is found in foods that come from animals, including meat and dairy products such as butter, cheese, and whole milk. The amount of fiber in a food. You should choose high-fiber foods such as fruits, vegetables, and whole grains. Try to get 25-30 grams (g) of fiber a day.  Cooking When cooking, use heart-healthy oils that are high in monounsaturated fats. These include olive oil, canola oil, and avocado oil. Limit frying or deep-frying foods. Cook foods using healthy methods such as baking, boiling, steaming, and grilling instead. Meal planning You may want to keep track of how many calories you take in. Eating the right amount of calories will help you achieve a healthy weight. Meeting with a registered dietitian can help you get started. Limit how often you eat takeout and fast food. These foods are usually very high in fat, salt, and sugar. Use the glycemic index (GI) to plan your meals. The index tells you how quickly a food will raise your blood sugar. Choose low-GI foods (GI less than 55). These foods take a longer time to raise blood sugar. A registered dietitian can help you  identify foods lower on the GI scale. Lifestyle You may want to follow a Mediterranean diet. This diet includes a lot of vegetables, lean meats or fish, whole grains, fruits, and healthy oils and fats. What foods can I eat?  Fruits Bananas. Apples. Oranges. Grapes. Papaya. Mango. Pomegranate. Kiwi. Grapefruit. Cherries. Vegetables Lettuce. Spinach. Peas. Beets. Cauliflower. Cabbage. Broccoli. Carrots. Tomatoes. Squash. Eggplant. Herbs. Peppers. Onions. Cucumbers. Brussels sprouts. Yams and sweet potatoes. Beans. Lentils. Grains Whole wheat or whole-grain foods, including breads, crackers, cereals, and pasta. Stone-ground whole wheat. Unsweetened oatmeal. Bulgur. Barley. Quinoa. Brown or wild rice. Corn or whole wheat flour tortillas. Meats and other proteins Lean meats. Poultry. Tofu. Seafood and shellfish. Dairy Low-fat or fat-free dairy products, such as yogurt, cottage cheese, or cheese. Beverages Water. Sugar-free drinks. Tea. Coffee. Low-fat or skim milk. Milk alternatives, such as soy or almond milk. Real fruit juice. Fats and oils Avocado. Canola or olive oil. Nuts and nut butters. Seeds. Seasonings and condiments Mustard. Relish. Low-fat, low-sugar ketchup and barbecue sauce. Low-fat or fat-free mayonnaise. Sweets and desserts Sugar-free sweets. The items listed above may not be a complete list of foods and beverages you can eat. Contact a dietitian for more information. What foods should I limit or avoid? Meats and other proteins Limit red meat to 1-2 times a week. Dairy [employer]. Fats and oils Palm oil and coconut oil. Fried foods. Other foods Processed foods. Foods that contain a lot of salt or sodium. Sweets and desserts Sweets that contain sugar. Beverages Sweetened drinks, such as sweet tea, milkshakes, iced sweet  sugar. °Beverages °Sweetened drinks, such as sweet tea, milkshakes, iced sweet drinks, and sodas. Alcohol. °The items listed above may not be a complete list of foods and beverages you should avoid. Contact a  dietitian for more information. °Where to find more information °The National Institute of Diabetes and Digestive and Kidney Diseases: niddk.nih.gov °Summary °· Nonalcoholic fatty liver disease is a condition that causes fat to build up in and around the liver. °· Following a healthy diet can help to keep nonalcoholic fatty liver disease under control. Your diet should be rich in fruits, vegetables, whole grains, and lean proteins. °· Limit your intake of saturated fat. Saturated fat is found in foods that come from animals, including meat and dairy products such as butter, cheese, and whole milk. °· This diet promotes weight loss, helps to control blood sugar levels, and helps to improve the way that the body uses insulin. °This information is not intended to replace advice given to you by your health care provider. Make sure you discuss any questions you have with your health care provider. °Document Revised: 08/09/2018 Document Reviewed: 05/09/2018 °Elsevier Patient Education © 2020 Elsevier Inc. ° °Fatty Liver Disease ° °Fatty liver disease occurs when too much fat has built up in your liver cells. Fatty liver disease is also called hepatic steatosis or steatohepatitis. The liver removes harmful substances from your bloodstream and produces fluids that your body needs. It also helps your body use and store energy from the food you eat. °In many cases, fatty liver disease does not cause symptoms or problems. It is often diagnosed when tests are being done for other reasons. However, over time, fatty liver can cause inflammation that may lead to more serious liver problems, such as scarring of the liver (cirrhosis) and liver failure. °Fatty liver is associated with insulin resistance, increased body fat, high blood pressure (hypertension), and high cholesterol. These are features of metabolic syndrome and increase your risk for stroke, diabetes, and heart disease. °What are the causes? °This condition may be  caused by: °· Drinking too much alcohol. °· Poor nutrition. °· Obesity. °· Cushing's syndrome. °· Diabetes. °· High cholesterol. °· Certain drugs. °· Poisons. °· Some viral infections. °· Pregnancy. °What increases the risk? °You are more likely to develop this condition if you: °· Abuse alcohol. °· Are overweight. °· Have diabetes. °· Have hepatitis. °· Have a high triglyceride level. °· Are pregnant. °What are the signs or symptoms? °Fatty liver disease often does not cause symptoms. If symptoms do develop, they can include: °· Fatigue. °· Weakness. °· Weight loss. °· Confusion. °· Abdominal pain. °· Nausea and vomiting. °· Yellowing of your skin and the white parts of your eyes (jaundice). °· Itchy skin. °How is this diagnosed? °This condition may be diagnosed by: °· A physical exam and medical history. °· Blood tests. °· Imaging tests, such as an ultrasound, CT scan, or MRI. °· A liver biopsy. A small sample of liver tissue is removed using a needle. The sample is then looked at under a microscope. °How is this treated? °Fatty liver disease is often caused by other health conditions. Treatment for fatty liver may involve medicines and lifestyle changes to manage conditions such as: °· Alcoholism. °· High cholesterol. °· Diabetes. °· Being overweight or obese. °Follow these instructions at home: ° °· Do not drink alcohol. If you have trouble quitting, ask your health care provider how to safely quit with the help of medicine or a supervised program. This is important   to keep your condition from getting worse. °· Eat a healthy diet as told by your health care provider. Ask your health care provider about working with a diet and nutrition specialist (dietitian) to develop an eating plan. °· Exercise regularly. This can help you lose weight and control your cholesterol and diabetes. Talk to your health care provider about an exercise plan and which activities are best for you. °· Take over-the-counter and  prescription medicines only as told by your health care provider. °· Keep all follow-up visits as told by your health care provider. This is important. °Contact a health care provider if: °You have trouble controlling your: °· Blood sugar. This is especially important if you have diabetes. °· Cholesterol. °· Drinking of alcohol. °Get help right away if: °· You have abdominal pain. °· You have jaundice. °· You have nausea and vomiting. °· You vomit blood or material that looks like coffee grounds. °· You have stools that are black, tar-like, or bloody. °Summary °· Fatty liver disease develops when too much fat builds up in the cells of your liver. °· Fatty liver disease often causes no symptoms or problems. However, over time, fatty liver can cause inflammation that may lead to more serious liver problems, such as scarring of the liver (cirrhosis). °· You are more likely to develop this condition if you abuse alcohol, are pregnant, are overweight, have diabetes, have hepatitis, or have high triglyceride levels. °· Contact your health care provider if you have trouble controlling your weight, blood sugar, cholesterol, or drinking of alcohol. °This information is not intended to replace advice given to you by your health care provider. Make sure you discuss any questions you have with your health care provider. °Document Revised: 03/30/2017 Document Reviewed: 01/24/2017 °Elsevier Patient Education © 2020 Elsevier Inc. ° °

## 2020-05-05 ENCOUNTER — Telehealth: Payer: Self-pay

## 2020-05-05 DIAGNOSIS — R7989 Other specified abnormal findings of blood chemistry: Secondary | ICD-10-CM

## 2020-05-05 LAB — HEPATIC FUNCTION PANEL
ALT: 73 IU/L — ABNORMAL HIGH (ref 0–32)
AST: 51 IU/L — ABNORMAL HIGH (ref 0–40)
Albumin: 4.4 g/dL (ref 3.8–4.8)
Alkaline Phosphatase: 222 IU/L — ABNORMAL HIGH (ref 44–121)
Bilirubin Total: 0.3 mg/dL (ref 0.0–1.2)
Bilirubin, Direct: 0.12 mg/dL (ref 0.00–0.40)
Total Protein: 6.8 g/dL (ref 6.0–8.5)

## 2020-05-05 NOTE — Telephone Encounter (Signed)
[facility] procedures fax is done Britta Mccreedy gave me another fax number to fax to which is [phone]

## 2020-05-05 NOTE — Telephone Encounter (Signed)
-----   Message from Toney Reil, MD sent at 05/05/2020  9:20 AM EST ----- Her LFTs are persistently elevated. Recommend ultrasound-guided liver biopsy  RV

## 2020-05-05 NOTE — Telephone Encounter (Signed)
Tried to call patient on primary number but voicemail is not set. Called patient on home number and she verbalized understanding. Order the ultrasound and faxed paper work to special procedures.  Will wait to find out when patient needs to go

## 2020-05-07 NOTE — Progress Notes (Signed)
Patient on schedule for Liver Biopsy 05/11/2020, called and spoke with patient on phone and pre procedure instructions given. Made aware to be here @ 0730, NPO after [place], and driver post procedure/discharge. Stated understanding

## 2020-05-10 ENCOUNTER — Other Ambulatory Visit: Payer: Self-pay | Admitting: Radiology

## 2020-05-10 NOTE — H&P (Signed)
Chief Complaint: Patient was seen in consultation today for non-focal liver biopsy   Referring Physician(s): Marius Ditch, Tally Due  Supervising Physician: Daryll Brod  Patient Status: [facility] - Out-pt  History of Present Illness: April Leblanc is a 70 y.o. female with a medical history significant for obesity, hypothyroidism, DM, left breast cancer (diagnosed 2018; on maintenance therapy) and elevated alkaline phosphatase levels since 2013. Recent labs show elevated AST and ALT levels. A right upper quadrant ultrasound revealed a fatty liver.   Interventional Radiology has been asked to evaluate this patient for an image-guided non-focal liver biopsy for further work up.    Past Medical History:  Diagnosis Date  . Anemia   . Ankle sprain 03/2017  . Arthritis   . Breast cancer ([place]) 2018  . Breast cancer ([place]) 03/2017  . Depression   . Fracture of distal end of radius 03/2017   left  . Genetic testing 05/04/2017   Multi-Cancer panel (83 genes) @ [facility] - No pathogenic mutations detected  . GERD (gastroesophageal reflux disease)   . Headache   . Hyperlipidemia   . Hypothyroidism   . Personal history of radiation therapy   . Pre-diabetes 03/2017   follows healthy diabetic diet  . Sleep apnea    has not used cpap in a long time  . Vitamin B 12 deficiency     Past Surgical History:  Procedure Laterality Date  . ABDOMINAL HYSTERECTOMY  1983  . APPENDECTOMY  1977   when she had c-section  . BREAST BIOPSY Left 03/27/2017   INVASIVE MAMMARY CARCINOMA Grade 1 UIG  . BREAST LUMPECTOMY Left 04/13/2017   INVASIVE MAMMARY CARCINOMA/ DCIS, CLEAR MARGINS, NEG. LN  . CESAREAN SECTION    . COLONOSCOPY    . COLONOSCOPY WITH PROPOFOL N/A 02/25/2020   Procedure: COLONOSCOPY WITH PROPOFOL;  Surgeon: [name], MD;  Location: [facility] ENDOSCOPY;  Service: Gastroenterology;  Laterality: N/A;  . ESOPHAGOGASTRODUODENOSCOPY (EGD) WITH PROPOFOL N/A 02/05/2015   Procedure:  ESOPHAGOGASTRODUODENOSCOPY (EGD) WITH PROPOFOL;  Surgeon: Josefine Class, MD;  Location[facility] ENDOSCOPY;  Service: Endoscopy;  Laterality: N/A;  . ESOPHAGOGASTRODUODENOSCOPY (EGD) WITH PROPOFOL N/A 02/25/2020   Procedure: ESOPHAGOGASTRODUODENOSCOPY (EGD) WITH PROPOFOL;  Surgeon: [name], MD;  Location: [facility] ENDOSCOPY;  Service: Gastroenterology;  Laterality: N/A;  . PARTIAL MASTECTOMY WITH NEEDLE LOCALIZATION Left 04/13/2017   Procedure: PARTIAL MASTECTOMY WITH NEEDLE LOCALIZATION;  Surgeon: Leonie Green, MD;  Location: [facility] ORS;  Service: General;  Laterality: Left;  . PATELLA FRACTURE SURGERY Left 1991   screws in place  . SAVORY DILATION N/A 02/05/2015   Procedure: SAVORY DILATION;  Surgeon: Josefine Class, MD;  Location: [facility] ENDOSCOPY;  Service: Endoscopy;  Laterality: N/A;  . SENTINEL NODE BIOPSY Left 04/13/2017   Procedure: SENTINEL NODE BIOPSY;  Surgeon: Leonie Green, MD;  Location: [facility] ORS;  Service: General;  Laterality: Left;  . SINUS SURGERY WITH INSTATRAK      Allergies: Amoxicillin, Erythromycin, Sulfa antibiotics, Iodinated diagnostic agents, and Morphine  Medications: Prior to Admission medications   Medication Sig Start Date End Date Taking? Authorizing Provider  acetaminophen (TYLENOL) 500 MG tablet Take 1,000 mg by mouth every 6 (six) hours as needed for moderate pain or headache.    [provider]  amphetamine-dextroamphetamine (ADDERALL XR) 10 MG 24 hr capsule Take 10 mg by mouth daily.    [provider]  anastrozole (ARIMIDEX) 1 MG tablet TAKE 1 TABLET BY MOUTH EVERY DAY 03/21/20   Sindy Guadeloupe,  MD  ARIPiprazole (ABILIFY) 15 MG tablet Take 15 mg by mouth daily. 04/20/20   [provider]  atorvastatin (LIPITOR) 40 MG tablet Take 40 mg by mouth daily.     [provider]  esomeprazole (NEXIUM) 40 MG capsule Take 40 mg by mouth daily.     [provider]  Estradiol 10 MCG TABS vaginal  tablet Place vaginally as needed.    [provider]  fluticasone (FLONASE) 50 MCG/ACT nasal spray Place 2 sprays into both nostrils daily as needed for allergies.    [provider]  levocetirizine (XYZAL) 5 MG tablet Take 5 mg by mouth every evening.  06/21/18   [provider]  levothyroxine (SYNTHROID, LEVOTHROID) 150 MCG tablet Take 150 mcg by mouth daily before breakfast.    [provider]  LORazepam (ATIVAN) 0.5 MG tablet Take 0.5 mg by mouth 3 (three) times daily.    [provider]  oxybutynin (DITROPAN-XL) 5 MG 24 hr tablet Take by mouth. 12/08/19 12/07/20  [provider]  traZODone (DESYREL) 100 MG tablet Take 150 mg by mouth at bedtime. Take  1 and 1/2 tablet for a total of 150mg  daily    [provider]  vitamin B-12 (CYANOCOBALAMIN) 1000 MCG tablet Take 1,000 mcg by mouth daily.    [provider]  Vitamin D, Ergocalciferol, (DRISDOL) 50000 units CAPS capsule Take 50,000 Units by mouth every 7 (seven) days.    [provider]  vortioxetine HBr (TRINTELLIX) 20 MG TABS tablet Take 20 mg by mouth daily.    [provider]     Family History  Problem Relation Age of Onset  . Breast cancer Maternal Aunt 70       deceased 84s  . Breast cancer Cousin 60       daughter of mat aunt with breast cancer  . Breast cancer Cousin 14       daughter of mat aunt with breast cancer  . Deep vein thrombosis Mother   . Heart disease Mother   . Stroke Mother   . Heart disease Father   . Parkinson's disease Father   . Heart disease Sister   . Stroke Sister   . Skin cancer Brother   . Heart disease Brother   . Alzheimer's disease Brother   . Lung cancer Paternal Uncle        deceased 1s  . Heart disease Brother   . Alzheimer's disease Brother   . Heart disease Brother   . Alzheimer's disease Brother   . Cancer Brother        unclear primary; currently 41s  . Cirrhosis Sister   . Alzheimer's disease  Sister   . Kidney cancer Son 18       nephrectomy @ [facility]    Social History   Socioeconomic History  . Marital status: Married    Spouse name: Not on file  . Number of children: Not on file  . Years of education: Not on file  . Highest education level: Not on file  Occupational History  . Not on file  Tobacco Use  . Smoking status: Never Smoker  . Smokeless tobacco: Never Used  Vaping Use  . Vaping Use: Never used  Substance and Sexual Activity  . Alcohol use: No  . Drug use: No  . Sexual activity: Not Currently  Other Topics Concern  . Not on file  Social History Narrative  . Not on file   Social Determinants of Health  Financial Resource Strain: Not on file  Food Insecurity: Not on file  Transportation Needs: Not on file  Physical Activity: Not on file  Stress: Not on file  Social Connections: Not on file    Review of Systems: A 12 point ROS discussed and pertinent positives are indicated in the HPI above.  All other systems are negative.  Review of Systems  Constitutional: Negative for appetite change and fatigue.  Respiratory: Negative for cough and shortness of breath.   Cardiovascular: Negative for chest pain and leg swelling.  Gastrointestinal: Negative for abdominal pain, diarrhea, nausea and vomiting.  Musculoskeletal: Negative for back pain.  Neurological: Negative for dizziness and headaches.    Vital Signs: BP 114/72   Pulse 69   Temp 98 F (36.7 C) (Oral)   Resp 18   Ht 5\' 4"  (1.626 m)   Wt 192 lb (87.1 kg)   SpO2 97%   BMI 32.96 kg/m   Physical Exam Constitutional:      General: She is not in acute distress. HENT:     Mouth/Throat:     Mouth: Mucous membranes are moist.     Pharynx: Oropharynx is clear.  Cardiovascular:     Rate and Rhythm: Normal rate and regular rhythm.     Pulses: Normal pulses.     Heart sounds: Normal heart sounds.  Pulmonary:     Effort: Pulmonary effort is normal.     Breath sounds: Normal breath sounds.   Abdominal:     General: Bowel sounds are normal.     Palpations: Abdomen is soft.     Tenderness: There is no abdominal tenderness.  Musculoskeletal:        General: Normal range of motion.  Skin:    General: Skin is warm and dry.  Neurological:     Mental Status: She is alert and oriented to person, place, and time.     Imaging: No results found.  Labs:  CBC: Recent Labs    10/24/19 1129 05/11/20 0743  WBC 12.2* 6.7  HGB 13.5 12.9  HCT 40.3 39.5  PLT 225 205    COAGS: Recent Labs    05/11/20 0743  INR 1.0    BMP: Recent Labs    10/24/19 1048 11/25/19 1317 04/20/20 1116  NA 140 138 137  K 4.0 4.2 4.2  CL 104 100 99  CO2 26 27 28   GLUCOSE 180* 264* 211*  BUN 16 14 18   CALCIUM 9.2 9.3 9.7  CREATININE 0.72 0.77 0.76  GFRNONAA >60 >60 >60  GFRAA >60 >60  --     LIVER FUNCTION TESTS: Recent Labs    10/24/19 1048 11/25/19 1317 04/20/20 1116 05/04/20 0955  BILITOT 0.5 0.7 0.6 0.3  AST 45* 64* 59* 51*  ALT 65* 79* 58* 73*  ALKPHOS 190* 166* 169* 222*  PROT 7.2 7.5 7.7 6.8  ALBUMIN 4.1 4.2 4.1 4.4    TUMOR MARKERS: No results for input(s): AFPTM, CEA, CA199, CHROMGRNA in the last 8760 hours.  Assessment and Plan:  Elevated liver enzymes: April Leblanc, 70 year old female, presents today to [facility] for an image-guided, non-focal liver biopsy.  Risks and benefits of this procedure were discussed with the patient and/or patient's family including, but not limited to bleeding, infection, damage to adjacent structures or low yield requiring additional tests.  All of the questions were answered and there is agreement to proceed.  The patient has been NPO. Labs and vitals have been reviewed.  Consent signed and in chart.  Thank you for this interesting consult.  I greatly enjoyed meeting April Leblanc and look forward to participating in their care.  A copy of this report was sent to the requesting provider on  this date.  Electronically Signed: Soyla Dryer, AGACNP-BC [phone] 05/11/2020, 8:11 AM   I spent a total of  30 Minutes   in face to face in clinical consultation, greater than 50% of which was counseling/coordinating care for non-focal liver biopsy

## 2020-05-10 NOTE — Telephone Encounter (Signed)
They have patient scheduled for 05/11/2020 [facility] 7:30[facility] for 8:30am scan. Called patient and she verbalized understanding of appointment

## 2020-05-11 ENCOUNTER — Other Ambulatory Visit: Payer: Self-pay

## 2020-05-11 ENCOUNTER — Ambulatory Visit
Admission: RE | Admit: 2020-05-11 | Discharge: 2020-05-11 | Disposition: A | Discharge status: Home / Self Care | Payer: Medicare Other | Source: Ambulatory Visit | Attending: Gastroenterology | Admitting: Gastroenterology

## 2020-05-11 DIAGNOSIS — K760 Fatty (change of) liver, not elsewhere classified: Secondary | ICD-10-CM | POA: Diagnosis not present

## 2020-05-11 DIAGNOSIS — R7989 Other specified abnormal findings of blood chemistry: Secondary | ICD-10-CM | POA: Diagnosis not present

## 2020-05-11 LAB — CBC
HCT: 39.5 % (ref 36.0–46.0)
Hemoglobin: 12.9 g/dL (ref 12.0–15.0)
MCH: 27.9 pg (ref 26.0–34.0)
MCHC: 32.7 g/dL (ref 30.0–36.0)
MCV: 85.3 fL (ref 80.0–100.0)
Platelets: 205 10*3/uL (ref 150–400)
RBC: 4.63 MIL/uL (ref 3.87–5.11)
RDW: 14 % (ref 11.5–15.5)
WBC: 6.7 10*3/uL (ref 4.0–10.5)
nRBC: 0 % (ref 0.0–0.2)

## 2020-05-11 LAB — PROTIME-INR
INR: 1 (ref 0.8–1.2)
Prothrombin Time: 12.5 seconds (ref 11.4–15.2)

## 2020-05-11 MED ORDER — MIDAZOLAM HCL 5 MG/5ML IJ SOLN
INTRAMUSCULAR | Status: AC | PRN
  Administered 2020-05-11: 1 mg via INTRAVENOUS

## 2020-05-11 MED ORDER — SODIUM CHLORIDE 0.9 % IV SOLN
INTRAVENOUS | Status: DC
  Administered 2020-05-11: 1000 mL via INTRAVENOUS

## 2020-05-11 MED ORDER — FENTANYL CITRATE (PF) 100 MCG/2ML IJ SOLN
INTRAMUSCULAR | Status: AC
  Filled 2020-05-11: qty 2

## 2020-05-11 MED ORDER — FENTANYL CITRATE (PF) 100 MCG/2ML IJ SOLN
INTRAMUSCULAR | Status: AC | PRN
  Administered 2020-05-11: 50 ug via INTRAVENOUS

## 2020-05-11 MED ORDER — MIDAZOLAM HCL 5 MG/5ML IJ SOLN
INTRAMUSCULAR | Status: AC
  Filled 2020-05-11: qty 5

## 2020-05-11 NOTE — Progress Notes (Signed)
Patient tolerated liver biopsy well. Handoff report to Coralyn Mark, Therapist, sports. Patient alert and oriented on arrival back to [facility]. Received 1mg  Versed and 6mcg Fentanyl for procedure.

## 2020-05-11 NOTE — Procedures (Signed)
Interventional Radiology Procedure Note ° °Procedure: US RANDOM LIVER CORE BX   ° °Complications: None ° °Estimated Blood Loss:  MIN ° °Findings: °18 G CORES X 2   ° °M. TREVOR Marrisa Kimber, MD ° ° ° °

## 2020-05-11 NOTE — Discharge Instructions (Signed)
Moderate Conscious Sedation, Adult, Care After This sheet gives you information about how to care for yourself after your procedure. Your health care provider may also give you more specific instructions. If you have problems or questions, contact your health care provider. What can I expect after the procedure? After the procedure, it is common to have:  Sleepiness for several hours.  Impaired judgment for several hours.  Difficulty with balance.  Vomiting if you eat too soon. Follow these instructions at home: For the time period you were told by your health care provider:  Rest.  Do not participate in activities where you could fall or become injured.  Do not drive or use machinery.  Do not drink alcohol.  Do not take sleeping pills or medicines that cause drowsiness.  Do not make important decisions or sign legal documents.  Do not take care of children on your own.      Eating and drinking  Follow the diet recommended by your health care provider.  Drink enough fluid to keep your urine pale yellow.  If you vomit: ? Drink water, juice, or soup when you can drink without vomiting. ? Make sure you have little or no nausea before eating solid foods.   General instructions  Take over-the-counter and prescription medicines only as told by your health care provider.  Have a responsible adult stay with you for the time you are told. It is important to have someone help care for you until you are awake and alert.  Do not smoke.  Keep all follow-up visits as told by your health care provider. This is important. Contact a health care provider if:  You are still sleepy or having trouble with balance after 24 hours.  You feel light-headed.  You keep feeling nauseous or you keep vomiting.  You develop a rash.  You have a fever.  You have redness or swelling around the IV site. Get help right away if:  You have trouble breathing.  You have new-onset confusion at  home. Summary  After the procedure, it is common to feel sleepy, have impaired judgment, or feel nauseous if you eat too soon.  Rest after you get home. Know the things you should not do after the procedure.  Follow the diet recommended by your health care provider and drink enough fluid to keep your urine pale yellow.  Get help right away if you have trouble breathing or new-onset confusion at home. This information is not intended to replace advice given to you by your health care provider. Make sure you discuss any questions you have with your health care provider. Document Revised: 08/15/2019 Document Reviewed: 03/13/2019 Elsevier Patient Education  2021 [employer] Inc. Liver Biopsy, Care After These instructions give you information on caring for yourself after your procedure. Your doctor may also give you more specific instructions. Call your doctor if you have any problems or questions after your procedure. What can I expect after the procedure? After the procedure, it is common to have:  Pain and soreness where the biopsy was done.  Bruising around the area where the biopsy was done.  Sleepiness and be tired for a few days. Follow these instructions at home: Medicines  Take over-the-counter and prescription medicines only as told by your doctor.  If you were prescribed an antibiotic medicine, take it as told by your doctor. Do not stop taking the antibiotic even if you start to feel better.  Do not take medicines such as aspirin and ibuprofen.   These medicines can thin your blood. Do not take these medicines unless your doctor tells you to take them.  If you are taking prescription pain medicine, take actions to prevent or treat constipation. Your doctor may recommend that you: ? Drink enough fluid to keep your pee (urine) clear or pale yellow. ? Take over-the-counter or prescription medicines. ? Eat foods that are high in fiber, such as fresh fruits and vegetables, whole  grains, and beans. ? Limit foods that are high in fat and processed sugars, such as fried and sweet foods. Caring for your cut  Follow instructions from your doctor about how to take care of your cuts from surgery (incisions). Make sure you: ? Wash your hands with soap and water before you change your bandage (dressing). If you cannot use soap and water, use hand sanitizer. ? Change your bandage as told by your doctor. ? Leave stitches (sutures), skin glue, or skin tape (adhesive) strips in place. They may need to stay in place for 2 weeks or longer. If tape strips get loose and curl up, you may trim the loose edges. Do not remove tape strips completely unless your doctor says it is okay.  Check your cuts every day for signs of infection. Check for: ? Redness, swelling, or more pain. ? Fluid or blood. ? Pus or a bad smell. ? Warmth.  Do not take baths, swim, or use a hot tub until your doctor says it is okay to do so. Activity  Rest at home for 1-2 days or as told by your doctor. ? Avoid sitting for a long time without moving. Get up to take short walks every 1-2 hours.  Return to your normal activities as told by your doctor. Ask what activities are safe for you.  Do not do these things in the first 24 hours: ? Drive. ? Use machinery. ? Take a bath or shower.  Do not lift more than 10 pounds (4.5 kg) or play contact sports for the first 2 weeks.   General instructions  Do not drink alcohol in the first week after the procedure.  Have someone stay with you for at least 24 hours after the procedure.  Get your test results. Ask your doctor or the department that is doing the test: ? When will my results be ready? ? How will I get my results? ? What are my treatment options? ? What other tests do I need? ? What are my next steps?  Keep all follow-up visits as told by your doctor. This is important.   Contact a doctor if:  A cut bleeds and leaves more than just a small spot  of blood.  A cut is red, puffs up (swells), or hurts more than before.  Fluid or something else comes from a cut.  A cut smells bad.  You have a fever or chills. Get help right away if:  You have swelling, bloating, or pain in your belly (abdomen).  You get dizzy or faint.  You have a rash.  You feel sick to your stomach (nauseous) or throw up (vomit).  You have trouble breathing, feel short of breath, or feel faint.  Your chest hurts.  You have problems talking or seeing.  You have trouble with your balance or moving your arms or legs. Summary  After the procedure, it is common to have pain, soreness, bruising, and tiredness.  Your doctor will tell you how to take care of yourself at home. Change your bandage,   take your medicines, and limit your activities as told by your doctor.  Call your doctor if you have symptoms of infection. Get help right away if your belly swells, your cut bleeds a lot, or you have trouble talking or breathing. This information is not intended to replace advice given to you by your health care provider. Make sure you discuss any questions you have with your health care provider. Document Revised: 04/26/2017 Document Reviewed: 04/27/2017 Elsevier Patient Education  2021 Elsevier Inc.  

## 2020-05-13 ENCOUNTER — Telehealth: Payer: Self-pay

## 2020-05-13 NOTE — Telephone Encounter (Signed)
-----   Message from [name], MD sent at 05/13/2020  1:37 PM EST ----- Liver biopsy results confirm that her elevated liver enzymes are secondary to fatty liver disease. She should continue to follow healthy diet, try to lose weight.  I will see her for follow-up in April as scheduled, will recheck LFTs at that time  RV

## 2020-05-13 NOTE — Telephone Encounter (Signed)
Tried to call patient but voicemail is not set up  

## 2020-05-13 NOTE — Telephone Encounter (Signed)
Tried to call patient but voicemail is not set up yet so could not leave a message

## 2020-05-14 ENCOUNTER — Ambulatory Visit: Admission: RE | Admit: 2020-05-14 | Payer: Medicare Other | Source: Ambulatory Visit

## 2020-05-14 ENCOUNTER — Ambulatory Visit: Payer: Medicare Other | Attending: Oncology

## 2020-05-14 ENCOUNTER — Ambulatory Visit: Payer: Medicare Other

## 2020-05-14 LAB — SURGICAL PATHOLOGY

## 2020-05-14 NOTE — Telephone Encounter (Signed)
Tried to call patient but voicemail is not set up  

## 2020-05-17 NOTE — Telephone Encounter (Signed)
Patient verbalized understanding of results  

## 2020-06-02 ENCOUNTER — Other Ambulatory Visit: Payer: Self-pay | Admitting: Oncology

## 2020-07-23 ENCOUNTER — Inpatient Hospital Stay: Admission: RE | Admit: 2020-07-23 | Payer: Medicare Other | Source: Ambulatory Visit

## 2020-07-27 ENCOUNTER — Ambulatory Visit
Admission: RE | Admit: 2020-07-27 | Discharge: 2020-07-27 | Disposition: A | Discharge status: Home / Self Care | Payer: Medicare Other | Source: Ambulatory Visit | Attending: Oncology | Admitting: Oncology

## 2020-07-27 ENCOUNTER — Other Ambulatory Visit: Payer: Self-pay

## 2020-07-27 DIAGNOSIS — Z170 Estrogen receptor positive status [ER+]: Secondary | ICD-10-CM | POA: Diagnosis present

## 2020-07-27 DIAGNOSIS — Z853 Personal history of malignant neoplasm of breast: Secondary | ICD-10-CM | POA: Diagnosis present

## 2020-07-27 DIAGNOSIS — Z08 Encounter for follow-up examination after completed treatment for malignant neoplasm: Secondary | ICD-10-CM | POA: Insufficient documentation

## 2020-07-27 DIAGNOSIS — C50412 Malignant neoplasm of upper-outer quadrant of left female breast: Secondary | ICD-10-CM | POA: Insufficient documentation

## 2020-08-16 ENCOUNTER — Other Ambulatory Visit: Payer: Self-pay

## 2020-08-17 ENCOUNTER — Ambulatory Visit (INDEPENDENT_AMBULATORY_CARE_PROVIDER_SITE_OTHER): Payer: Medicare Other | Admitting: Gastroenterology

## 2020-08-17 ENCOUNTER — Other Ambulatory Visit: Payer: Self-pay

## 2020-08-17 ENCOUNTER — Encounter: Payer: Self-pay | Admitting: Gastroenterology

## 2020-08-17 VITALS — BP 140/104 | HR 89 | Temp 98.0°F | Ht 67.0 in | Wt 192.5 lb

## 2020-08-17 DIAGNOSIS — R739 Hyperglycemia, unspecified: Secondary | ICD-10-CM | POA: Diagnosis not present

## 2020-08-17 DIAGNOSIS — K760 Fatty (change of) liver, not elsewhere classified: Secondary | ICD-10-CM

## 2020-08-17 NOTE — Progress Notes (Signed)
April Darby, MD [address]  [place]  [place], [place] [address]  Main: [phone]  Fax: [phone]    Gastroenterology Consultation  Referring Provider:     Gustavo Lah, MD Primary Care Physician:  Gustavo Lah, MD Primary Gastroenterologist:  Dr. Cephas Leblanc Reason for Consultation:    Fatty liver        HPI:   April Leblanc is a 70 y.o. female referred by Dr. Arlington Calix, Fanny Dance, MD  for consultation & management of elevated LFTs.  Patient has history of obesity, hypothyroidism, hyperlipidemia, chronic GERD, diabetes is noted to have elevated alkaline phosphatase since 01/2012.  Most recently, she is also found to have elevated AST and ALT, 45/65 in 09/2019, increased to 64/79 in 10/2019.  Unknown hepatitis B and C status.  ANA negative, ceruloplasmin levels were normal in the past.  Right upper quadrant ultrasound revealed gallbladder polyp as well as fatty liver. Patient has history of chronic GERD, underwent EGD in 01/2015 by Dr. Rayann Heman, found to have LA grade B esophagitis and medium size hiatal hernia.  Patient has been on Nexium 40 mg daily since then.  She remains asymptomatic on Nexium.  She denies any lower GI symptoms. She reports having blood transfusion several years ago.  She denies history of IV drug abuse or any other high risk behaviors.  She lost about 7 pounds by following healthy diet.  She has cut back on snacking.  She does drink diet soda a few times a week.  Follow-up visit 05/04/2020 Patient denies any GI symptoms today.  She underwent work-up for elevated liver enzymes.  Secondary liver disease work-up is negative.  Patient reports that she has not been maintaining healthy diet during holidays.  She said she has slacked off on it.  Follow-up visit 08/17/2020 Patient is here for follow-up of fatty liver.  Her fatty liver disease is biopsy-proven.  Secondary liver disease work-up is negative.  She reports that she is having hard  time managing healthy diet by herself.  She is requesting if she can be seen by a dietitian for education.  NSAIDs: None  Antiplts/Anticoagulants/Anti thrombotics: None  Patient did not undergo colonoscopy in the past GI Procedures: EGD 02/05/2015 - Medium-sized hiatus hernia. - LA Grade A reflux esophagitis. Biopsied. - Discolored, granular mucosa in the esophagus. Biopsied. - Normal stomach. - Normal examined duodenum. DIAGNOSIS:  A. GEJ; COLD BIOPSY:  - SQUAMOCOLUMNAR MUCOSA WITH MILD GASTRITIS, COMPATIBLE WITH REFLUX.  - NEGATIVE FOR DYSPLASIA AND MALIGNANCY.   B. ESOPHAGUS ABNORMAL MUCOSA, AT 17 CM; COLD BIOPSY:  - REFLUX GASTROESOPHAGITIS.  - NEGATIVE FOR DYSPLASIA AND MALIGNANCY.  EGD and colonoscopy 02/25/2020 - Normal esophagus and gastroesophageal junction. - Normal stomach. - Normal examined duodenum. - Medium-sized hiatal hernia. - Esophagogastric landmarks identified. - No specimens collected.  - One diminutive polyp in the ascending colon, removed with a cold biopsy forceps. Resected and retrieved. - One 4 mm polyp in the transverse colon, removed with a cold snare. Resected and retrieved. - The examination was otherwise normal. - Non-bleeding external hemorrhoids. - A single non-bleeding colonic angiodysplastic lesion.  DIAGNOSIS:  A. COLON POLYP, ASCENDING; COLD BIOPSY:  - TUBULAR ADENOMA.  - NEGATIVE FOR HIGH-GRADE DYSPLASIA AND MALIGNANCY.   B. COLON POLYP, TRANSVERSE; COLD SNARE:  - TUBULAR ADENOMA.  - NEGATIVE FOR HIGH-GRADE DYSPLASIA AND MALIGNANCY.   Ultrasound-guided liver biopsy 05/11/2020 DIAGNOSIS:  A. LIVER, RIGHT LOBE; ULTRASOUND-GUIDED CORE NEEDLE BIOPSY:  - MODERATE MACROVESICULAR  STEATOSIS (30-40%), WITH MILD LOBULAR ACTIVITY  AND FOCAL BALLOONING DEGENERATION.  - NAS SCORE 4 OF 8    - STEATOSIS - SCORE 2 (>33-66%)    - LOBULAR INFLAMMATION - SCORE 1    - HEPATOCYTE BALLONING - SCORE 1 (FEW BALLOON CELLS)  - MILD  PERICELLULAR FIBROSIS (STAGE 1A OF 4)   Past Medical History:  Diagnosis Date  . Anemia   . Ankle sprain 03/2017  . Arthritis   . Breast cancer ([place]) 2018  . Breast cancer ([place]) 03/2017  . Depression   . Fracture of distal end of radius 03/2017   left  . Genetic testing 05/04/2017   Multi-Cancer panel (83 genes) @ [facility] - No pathogenic mutations detected  . GERD (gastroesophageal reflux disease)   . Headache   . Hyperlipidemia   . Hypothyroidism   . Personal history of radiation therapy   . Pre-diabetes 03/2017   follows healthy diabetic diet  . Sleep apnea    has not used cpap in a long time  . Vitamin B 12 deficiency     Past Surgical History:  Procedure Laterality Date  . ABDOMINAL HYSTERECTOMY  1983  . APPENDECTOMY  1977   when she had c-section  . BREAST BIOPSY Left 03/27/2017   INVASIVE MAMMARY CARCINOMA Grade 1 UIG  . BREAST LUMPECTOMY Left 04/13/2017   INVASIVE MAMMARY CARCINOMA/ DCIS, CLEAR MARGINS, NEG. LN  . CESAREAN SECTION    . COLONOSCOPY    . COLONOSCOPY WITH PROPOFOL N/A 02/25/2020   Procedure: COLONOSCOPY WITH PROPOFOL;  Surgeon: [name], MD;  Location: [facility] ENDOSCOPY;  Service: Gastroenterology;  Laterality: N/A;  . ESOPHAGOGASTRODUODENOSCOPY (EGD) WITH PROPOFOL N/A 02/05/2015   Procedure: ESOPHAGOGASTRODUODENOSCOPY (EGD) WITH PROPOFOL;  Surgeon: Josefine Class, MD;  Location: [facility] ENDOSCOPY;  Service: Endoscopy;  Laterality: N/A;  . ESOPHAGOGASTRODUODENOSCOPY (EGD) WITH PROPOFOL N/A 02/25/2020   Procedure: ESOPHAGOGASTRODUODENOSCOPY (EGD) WITH PROPOFOL;  Surgeon: [name], MD;  Location: [facility] ENDOSCOPY;  Service: Gastroenterology;  Laterality: N/A;  . PARTIAL MASTECTOMY WITH NEEDLE LOCALIZATION Left 04/13/2017   Procedure: PARTIAL MASTECTOMY WITH NEEDLE LOCALIZATION;  Surgeon: Leonie Green, MD;  Location: [facility] ORS;  Service: General;  Laterality: Left;  . PATELLA FRACTURE SURGERY Left 1991   screws in place  .  SAVORY DILATION N/A 02/05/2015   Procedure: SAVORY DILATION;  Surgeon: Josefine Class, MD;  Location: [facility] ENDOSCOPY;  Service: Endoscopy;  Laterality: N/A;  . SENTINEL NODE BIOPSY Left 04/13/2017   Procedure: SENTINEL NODE BIOPSY;  Surgeon: Leonie Green, MD;  Location: [facility] ORS;  Service: General;  Laterality: Left;  . SINUS SURGERY WITH INSTATRAK      Current Outpatient Medications:  .  acetaminophen (TYLENOL) 500 MG tablet, Take 1,000 mg by mouth every 6 (six) hours as needed for moderate pain or headache., Disp: , Rfl:  .  amphetamine-dextroamphetamine (ADDERALL XR) 10 MG 24 hr capsule, Take 10 mg by mouth daily., Disp: , Rfl:  .  anastrozole (ARIMIDEX) 1 MG tablet, TAKE 1 TABLET BY MOUTH EVERY DAY, Disp: 90 tablet, Rfl: 0 .  ARIPiprazole (ABILIFY) 15 MG tablet, Take 15 mg by mouth daily., Disp: , Rfl:  .  atorvastatin (LIPITOR) 40 MG tablet, Take 40 mg by mouth daily. , Disp: , Rfl:  .  esomeprazole (NEXIUM) 40 MG capsule, Take 40 mg by mouth daily. , Disp: , Rfl:  .  Estradiol 10 MCG TABS vaginal tablet, Place vaginally as needed., Disp: , Rfl:  .  fluticasone (FLONASE) 50  MCG/ACT nasal spray, Place 2 sprays into both nostrils daily as needed for allergies., Disp: , Rfl:  .  levocetirizine (XYZAL) 5 MG tablet, Take 5 mg by mouth every evening. , Disp: , Rfl:  .  levothyroxine (SYNTHROID, LEVOTHROID) 150 MCG tablet, Take 150 mcg by mouth daily before breakfast., Disp: , Rfl:  .  LORazepam (ATIVAN) 0.5 MG tablet, Take 0.5 mg by mouth 3 (three) times daily., Disp: , Rfl:  .  oxybutynin (DITROPAN-XL) 5 MG 24 hr tablet, Take by mouth., Disp: , Rfl:  .  traZODone (DESYREL) 150 MG tablet, Take 225 mg by mouth at bedtime., Disp: , Rfl:  .  vitamin B-12 (CYANOCOBALAMIN) 1000 MCG tablet, Take 1,000 mcg by mouth daily., Disp: , Rfl:  .  Vitamin D, Ergocalciferol, (DRISDOL) 50000 units CAPS capsule, Take 50,000 Units by mouth every 7 (seven) days., Disp: , Rfl:  .  vortioxetine HBr  (TRINTELLIX) 20 MG TABS tablet, Take 20 mg by mouth daily., Disp: , Rfl:    Family History  Problem Relation Age of Onset  . Breast cancer Maternal Aunt 70       deceased 11s  . Breast cancer Cousin 17       daughter of mat aunt with breast cancer  . Breast cancer Cousin 40       daughter of mat aunt with breast cancer  . Deep vein thrombosis Mother   . Heart disease Mother   . Stroke Mother   . Heart disease Father   . Parkinson's disease Father   . Heart disease Sister   . Stroke Sister   . Skin cancer Brother   . Heart disease Brother   . Alzheimer's disease Brother   . Lung cancer Paternal Uncle        deceased 55s  . Heart disease Brother   . Alzheimer's disease Brother   . Heart disease Brother   . Alzheimer's disease Brother   . Cancer Brother        unclear primary; currently 64s  . Cirrhosis Sister   . Alzheimer's disease Sister   . Kidney cancer Son 76       nephrectomy @ [facility]     Social History   Tobacco Use  . Smoking status: Never Smoker  . Smokeless tobacco: Never Used  Vaping Use  . Vaping Use: Never used  Substance Use Topics  . Alcohol use: No  . Drug use: No    Allergies as of 08/17/2020 - Review Complete 08/17/2020  Allergen Reaction Noted  . Amoxicillin Hives, Itching, and Other (See Comments) 12/19/2011  . Erythromycin Hives and Itching 12/19/2011  . Sulfa antibiotics Hives and Itching 12/19/2011  . Iodinated diagnostic agents Rash 02/04/2015  . Morphine Nausea And Vomiting and Other (See Comments) 12/19/2011    Review of Systems:    All systems reviewed and negative except where noted in HPI.   Physical Exam:  BP (!) 140/104 (BP Location: Left Arm, Patient Position: Sitting, Cuff Size: Large)   Pulse 89   Temp 98 F (36.7 C) (Oral)   Ht 5\' 7"  (1.702 m)   Wt 192 lb 8 oz (87.3 kg)   BMI 30.15 kg/m  No LMP recorded. Patient has had a hysterectomy.  General:   Alert,  Well-developed, well-nourished, pleasant and cooperative in  NAD Head:  Normocephalic and atraumatic. Eyes:  Sclera clear, no icterus.   Conjunctiva pink. Ears:  Normal auditory acuity. Nose:  No deformity, discharge, or lesions. Mouth:  No  deformity or lesions,oropharynx pink & moist. Neck:  Supple; no masses or thyromegaly. Lungs:  Respirations even and unlabored.  Clear throughout to auscultation.   No wheezes, crackles, or rhonchi. No acute distress. Heart:  Regular rate and rhythm; no murmurs, clicks, rubs, or gallops. Abdomen:  Normal bowel sounds. Soft, obese, non-tender and non-distended without masses, hepatosplenomegaly or hernias noted.  No guarding or rebound tenderness.   Rectal: Not performed Msk:  Symmetrical without gross deformities. Good, equal movement & strength bilaterally. Pulses:  Normal pulses noted. Extremities:  No clubbing or edema.  No cyanosis. Neurologic:  Alert and oriented x3;  grossly normal neurologically. Skin:  Intact without significant lesions or rashes. No jaundice. Lymph Nodes:  No significant cervical adenopathy. Psych:  Alert and cooperative. Normal mood and affect.  Imaging Studies: Reviewed  Assessment and Plan:   April Leblanc is a 70 y.o. Caucasian female with obesity, hypothyroidism, diabetes, chronic GERD is seen in consultation for chronically elevated LFTs, ultrasound revealed fatty liver  Elevated LFTs: Secondary to fatty liver disease, biopsy-proven Secondary liver disease work-up is negative Recheck LFTs today Referral to dietitian for dietary management of fatty liver Her hemoglobin A1c was 6.6 in 2020, recheck hemoglobin A1c today.  Patient will be a good candidate for metformin given her history of fatty liver, will communicate with her PCP, Dr. Arlington Calix  Chronic GERD Continue Nexium 40 mg daily EGD did not reveal any evidence of Barrett's.  Given history of medium size hiatal hernia, recommend long-term PPI  Tubular adenomas of the colon Recommend surveillance colonoscopy in  01/2027   Follow up in 3 months   April Darby, MD

## 2020-08-18 ENCOUNTER — Telehealth: Payer: Self-pay

## 2020-08-18 DIAGNOSIS — E1165 Type 2 diabetes mellitus with hyperglycemia: Secondary | ICD-10-CM

## 2020-08-18 LAB — HEMOGLOBIN A1C
Est. average glucose Bld gHb Est-mCnc: 232 mg/dL
Hgb A1c MFr Bld: 9.7 % — ABNORMAL HIGH (ref 4.8–5.6)

## 2020-08-18 LAB — HEPATIC FUNCTION PANEL
ALT: 67 IU/L — ABNORMAL HIGH (ref 0–32)
AST: 59 IU/L — ABNORMAL HIGH (ref 0–40)
Albumin: 4.3 g/dL (ref 3.8–4.8)
Alkaline Phosphatase: 223 IU/L — ABNORMAL HIGH (ref 44–121)
Bilirubin Total: 0.4 mg/dL (ref 0.0–1.2)
Bilirubin, Direct: <0.1 mg/dL (ref 0.00–0.40)
Total Protein: 7.1 g/dL (ref 6.0–8.5)

## 2020-08-18 NOTE — Telephone Encounter (Signed)
-----   Message from [name], MD sent at 08/18/2020  8:33 AM EDT ----- Regarding: RE: Diagnosis for coverage Caryl Pina  Please change the diagnosis to uncontrolled type 2 diabetes.  Her latest hemoglobin A1c is significantly elevated to 9.7.  I messaged her PCP and please inform the patient as well to call her PCPs office for further management of diabetes  Thanks much RV  ----- Message ----- From: Sharlyn Bologna Sent: 08/17/2020   3:10 PM EDT To: [name], MD Subject: Diagnosis for coverage                         Good afternoon. In order for your patient's insurance to cover the benefit to meet with a dietitian she must have a diagnosis of diabetes and/or renal disease. If her labs indicate diabetes please make an addendum to this referral and add the diagnosis so her Medicare plan will cover.   Thank you for this referral, and if we can be of further assistance to you or your patient, let us know.   [place], [facility] Administrative Clinical Assistant Direct Dial: [phone]  Fax: [phone]  Website: [email or web address]

## 2020-08-18 NOTE — Telephone Encounter (Signed)
Patient will contact pcp office. Fixed the referral

## 2020-08-23 DIAGNOSIS — E119 Type 2 diabetes mellitus without complications: Secondary | ICD-10-CM | POA: Insufficient documentation

## 2020-08-23 DIAGNOSIS — K7581 Nonalcoholic steatohepatitis (NASH): Secondary | ICD-10-CM | POA: Insufficient documentation

## 2020-08-25 ENCOUNTER — Other Ambulatory Visit: Payer: Self-pay | Admitting: Oncology

## 2020-08-26 ENCOUNTER — Encounter: Payer: Self-pay | Admitting: Radiation Oncology

## 2020-08-26 ENCOUNTER — Ambulatory Visit
Admission: RE | Admit: 2020-08-26 | Discharge: 2020-08-26 | Disposition: A | Discharge status: Home / Self Care | Payer: Medicare Other | Source: Ambulatory Visit | Attending: Radiation Oncology | Admitting: Radiation Oncology

## 2020-08-26 VITALS — BP 151/88 | HR 89 | Temp 97.2°F | Resp 16 | Wt 192.7 lb

## 2020-08-26 DIAGNOSIS — Z170 Estrogen receptor positive status [ER+]: Secondary | ICD-10-CM | POA: Diagnosis not present

## 2020-08-26 DIAGNOSIS — Z923 Personal history of irradiation: Secondary | ICD-10-CM | POA: Diagnosis not present

## 2020-08-26 DIAGNOSIS — Z79811 Long term (current) use of aromatase inhibitors: Secondary | ICD-10-CM | POA: Diagnosis not present

## 2020-08-26 DIAGNOSIS — C50212 Malignant neoplasm of upper-inner quadrant of left female breast: Secondary | ICD-10-CM | POA: Insufficient documentation

## 2020-08-26 DIAGNOSIS — C50412 Malignant neoplasm of upper-outer quadrant of left female breast: Secondary | ICD-10-CM

## 2020-08-26 NOTE — Progress Notes (Signed)
Radiation Oncology Follow up Note  Name: April Leblanc   Date:   08/26/2020 MRN:  [id number] DOB: 11-May-1950    This 70 y.o. female presents to the clinic today for 3-year follow-up status post whole breast radiation to her left breast for stage I ER/PR positive invasive mammary carcinoma.  REFERRING PROVIDER: Gustavo Lah, MD  HPI: Patient is a 70 year old female now out 3 years having completed whole breast radiation to her left breast for stage I ER/PR positive invasive mammary carcinoma.  Seen today in routine follow-up she is doing well.  She specifically denies breast tenderness cough or bone pain..  She had mammograms performed last month which I have reviewed were BI-RADS 2 benign.  She is currently on Arimidex tolerating it well without side effect.  COMPLICATIONS OF TREATMENT: none  FOLLOW UP COMPLIANCE: keeps appointments   PHYSICAL EXAM:  BP (!) 151/88   Pulse 89   Temp (!) 97.2 F (36.2 C)   Resp 16   Wt 192 lb 11.2 oz (87.4 kg)   SpO2 97%   BMI 30.18 kg/m  Lungs are clear to A&P cardiac examination essentially unremarkable with regular rate and rhythm. No dominant mass or nodularity is noted in either breast in 2 positions examined. Incision is well-healed. No axillary or supraclavicular adenopathy is appreciated. Cosmetic result is excellent.  Well-developed well-nourished patient in NAD. HEENT reveals PERLA, EOMI, discs not visualized.  Oral cavity is clear. No oral mucosal lesions are identified. Neck is clear without evidence of cervical or supraclavicular adenopathy. Lungs are clear to A&P. Cardiac examination is essentially unremarkable with regular rate and rhythm without murmur rub or thrill. Abdomen is benign with no organomegaly or masses noted. Motor sensory and DTR levels are equal and symmetric in the upper and lower extremities. Cranial nerves II through XII are grossly intact. Proprioception is intact. No peripheral adenopathy or edema is identified.  No motor or sensory levels are noted. Crude visual fields are within normal range.  RADIOLOGY RESULTS: Mammograms reviewed compatible with above-stated findings  PLAN: Present time patient continues to do well with no evidence of disease.  Of asked to see her back 1 more time in a year and then will discontinue follow-up care.  Patient continues on Arimidex without side effect.  She continues close follow-up care with medical oncology.  Patient knows to call with any concerns.  I would like to take this opportunity to thank you for allowing me to participate in the care of your patient.Noreene Filbert, MD

## 2020-08-30 ENCOUNTER — Encounter: Payer: Medicare Other | Attending: Gastroenterology | Admitting: *Deleted

## 2020-08-30 ENCOUNTER — Other Ambulatory Visit: Payer: Self-pay

## 2020-08-30 ENCOUNTER — Encounter: Payer: Self-pay | Admitting: *Deleted

## 2020-08-30 VITALS — BP 122/78 | Ht 67.5 in | Wt 191.7 lb

## 2020-08-30 DIAGNOSIS — E1165 Type 2 diabetes mellitus with hyperglycemia: Secondary | ICD-10-CM | POA: Insufficient documentation

## 2020-08-30 NOTE — Patient Instructions (Signed)
Exercise: Continue walking for 10-15  minutes 3-4 days a week and gradually increase to 150 minutes/week  Eat 3 meals day, 1-2  snacks a day Space meals 4-6 hours apart Don't skip meals - eat at least 1 serving of protein and 1 serving of carbohydrate  Make an eye doctor appointment  Return for classes on:

## 2020-08-30 NOTE — Progress Notes (Signed)
Diabetes Self-Management Education  Visit Type: First/Initial  Appt. Start Time: 1040 Appt. End Time: 1200  08/30/2020  Ms. April Leblanc, identified by name and date of birth, is a 70 y.o. female with a diagnosis of Diabetes: Type 2.   ASSESSMENT  Blood pressure 122/78, height 5' 7.5" (1.715 m), weight 191 lb 11.2 oz (87 kg). Body mass index is 29.58 kg/m.   Diabetes Self-Management Education - 08/30/20 1526      Visit Information   Visit Type First/Initial      Initial Visit   Diabetes Type Type 2    Are you currently following a meal plan? Yes    What type of meal plan do you follow? "more fresh veggies, less red meat, smaller portions"    Are you taking your medications as prescribed? Yes    Date Diagnosed 2 weeks      Health Coping   How would you rate your overall health? Fair      Psychosocial Assessment   Patient Belief/Attitude about Diabetes Motivated to manage diabetes    Self-care barriers None    Self-management support Doctor's office;Family    Patient Concerns Nutrition/Meal planning;Glycemic Control;Monitoring;Weight Control;Healthy Lifestyle    Special Needs None    Preferred Learning Style Visual;Hands on    Learning Readiness Change in progress    How often do you need to have someone help you when you read instructions, pamphlets, or other written materials from your doctor or pharmacy? 1 - Never    What is the last grade level you completed in school? 12      Pre-Education Assessment   Patient understands the diabetes disease and treatment process. Needs Instruction    Patient understands incorporating nutritional management into lifestyle. Needs Instruction    Patient undertands incorporating physical activity into lifestyle. Needs Instruction    Patient understands using medications safely. Needs Instruction    Patient understands monitoring blood glucose, interpreting and using results Needs Instruction    Patient understands prevention,  detection, and treatment of acute complications. Needs Instruction    Patient understands prevention, detection, and treatment of chronic complications. Needs Instruction    Patient understands how to develop strategies to address psychosocial issues. Needs Instruction    Patient understands how to develop strategies to promote health/change behavior. Needs Instruction      Complications   Last HgB A1C per patient/outside source 9 %   08/23/2020   How often do you check your blood sugar? Patient declines   Pt doesn't want to check her blood sugars at home. BG in the office today was 257 mg/dL at 11:50 am - 3 hrs pp. She reports having cereal and milk for breakfast.   Have you had a dilated eye exam in the past 12 months? No    Have you had a dental exam in the past 12 months? Yes    Are you checking your feet? Yes    How many days per week are you checking your feet? 7      Dietary Intake   Breakfast cereal and milk; [employer] breakfast bars; yogurt    Lunch skips or eats sandwich - chicken, [place], pimento cheese or leftovers    Snack (afternoon) 0-1 snacks per day - nuts, rare ice cream    Dinner chicken, beef, pork, fish, [place]; potatoes, peas, corn, beans, rice, pasta, green beans, broccolli, cauliflower, spinach, lettuce, tomatoes, carrots, cuccumbers, radishes, beets, squash, okra    Beverage(s) water, coffee, occasional diet soda  Exercise   Exercise Type Light (walking / raking leaves)    How many days per week to you exercise? 3.5    How many minutes per day do you exercise? 15    Total minutes per week of exercise 52.5      Patient Education   Previous Diabetes Education No    Disease state  Definition of diabetes, type 1 and 2, and the diagnosis of diabetes;Factors that contribute to the development of diabetes    Nutrition management  Role of diet in the treatment of diabetes and the relationship between the three main macronutrients and blood glucose level;Food label  reading, portion sizes and measuring food.;Reviewed blood glucose goals for pre and post meals and how to evaluate the patients' food intake on their blood glucose level.    Physical activity and exercise  Role of exercise on diabetes management, blood pressure control and cardiac health.    Medications Reviewed patients medication for diabetes, action, purpose, timing of dose and side effects.    Monitoring Purpose and frequency of SMBG.;Taught/discussed recording of test results and interpretation of SMBG.;Identified appropriate SMBG and/or A1C goals.;Yearly dilated eye exam    Chronic complications Relationship between chronic complications and blood glucose control    Psychosocial adjustment Identified and addressed patients feelings and concerns about diabetes      Individualized Goals (developed by patient)   Reducing Risk Other (comment)   improve blood sugars, prevent diabetes complications, lose weight, lead a healthier lifestyle, become more fit     Outcomes   Expected Outcomes Demonstrated interest in learning. Expect positive outcomes    Future DMSE 2 wks           Individualized Plan for Diabetes Self-Management Training:   Learning Objective:  Patient will have a greater understanding of diabetes self-management. Patient education plan is to attend individual and/or group sessions per assessed needs and concerns.   Plan:   Patient Instructions  Exercise: Continue walking for 10-15  minutes 3-4 days a week and gradually increase to 150 minutes/week  Eat 3 meals day, 1-2  snacks a day Space meals 4-6 hours apart Don't skip meals - eat at least 1 serving of protein and 1 serving of carbohydrate  Make an eye doctor appointment  Expected Outcomes:  Demonstrated interest in learning. Expect positive outcomes  Education material provided:  General Meal Planning Guidelines Simple Meal Plan  If problems or questions, patient to contact team via:  Johny Drilling, RN, [place] [phone]  Future DSME appointment: 3 wks  Sep 23, 2020 for Diabetes Class 1

## 2020-09-23 ENCOUNTER — Encounter: Payer: Self-pay | Admitting: Dietician

## 2020-09-23 ENCOUNTER — Encounter: Payer: Medicare Other | Admitting: Dietician

## 2020-09-23 ENCOUNTER — Other Ambulatory Visit: Payer: Self-pay

## 2020-09-23 VITALS — Ht 67.5 in | Wt 189.5 lb

## 2020-09-23 DIAGNOSIS — E1165 Type 2 diabetes mellitus with hyperglycemia: Secondary | ICD-10-CM

## 2020-09-23 NOTE — Progress Notes (Signed)

## 2020-09-30 ENCOUNTER — Encounter: Payer: Medicare Other | Attending: Gastroenterology | Admitting: *Deleted

## 2020-09-30 ENCOUNTER — Other Ambulatory Visit: Payer: Self-pay

## 2020-09-30 ENCOUNTER — Encounter: Payer: Self-pay | Admitting: *Deleted

## 2020-09-30 VITALS — Wt 188.1 lb

## 2020-09-30 DIAGNOSIS — E119 Type 2 diabetes mellitus without complications: Secondary | ICD-10-CM | POA: Diagnosis not present

## 2020-09-30 DIAGNOSIS — E1165 Type 2 diabetes mellitus with hyperglycemia: Secondary | ICD-10-CM

## 2020-09-30 NOTE — Progress Notes (Signed)

## 2020-10-06 ENCOUNTER — Other Ambulatory Visit: Payer: Self-pay | Admitting: *Deleted

## 2020-10-06 DIAGNOSIS — C50412 Malignant neoplasm of upper-outer quadrant of left female breast: Secondary | ICD-10-CM

## 2020-10-07 ENCOUNTER — Encounter: Payer: Medicare Other | Admitting: *Deleted

## 2020-10-07 ENCOUNTER — Encounter: Payer: Self-pay | Admitting: *Deleted

## 2020-10-07 ENCOUNTER — Other Ambulatory Visit: Payer: Self-pay

## 2020-10-07 VITALS — BP 128/82 | Wt 186.8 lb

## 2020-10-07 DIAGNOSIS — E119 Type 2 diabetes mellitus without complications: Secondary | ICD-10-CM | POA: Diagnosis not present

## 2020-10-07 DIAGNOSIS — E1165 Type 2 diabetes mellitus with hyperglycemia: Secondary | ICD-10-CM

## 2020-10-07 NOTE — Progress Notes (Signed)

## 2020-10-11 ENCOUNTER — Encounter: Payer: Self-pay | Admitting: Oncology

## 2020-10-11 ENCOUNTER — Other Ambulatory Visit: Payer: Self-pay | Admitting: Oncology

## 2020-10-11 ENCOUNTER — Inpatient Hospital Stay: Payer: Medicare Other | Attending: Oncology

## 2020-10-11 ENCOUNTER — Inpatient Hospital Stay (HOSPITAL_BASED_OUTPATIENT_CLINIC_OR_DEPARTMENT_OTHER): Payer: Medicare Other | Admitting: Oncology

## 2020-10-11 VITALS — BP 139/88 | HR 76 | Temp 97.4°F | Resp 17 | Ht 67.0 in | Wt 187.9 lb

## 2020-10-11 DIAGNOSIS — Z170 Estrogen receptor positive status [ER+]: Secondary | ICD-10-CM | POA: Insufficient documentation

## 2020-10-11 DIAGNOSIS — C50412 Malignant neoplasm of upper-outer quadrant of left female breast: Secondary | ICD-10-CM | POA: Diagnosis present

## 2020-10-11 DIAGNOSIS — Z7984 Long term (current) use of oral hypoglycemic drugs: Secondary | ICD-10-CM | POA: Insufficient documentation

## 2020-10-11 DIAGNOSIS — E538 Deficiency of other specified B group vitamins: Secondary | ICD-10-CM | POA: Insufficient documentation

## 2020-10-11 DIAGNOSIS — G4730 Sleep apnea, unspecified: Secondary | ICD-10-CM | POA: Insufficient documentation

## 2020-10-11 DIAGNOSIS — Z923 Personal history of irradiation: Secondary | ICD-10-CM | POA: Diagnosis not present

## 2020-10-11 DIAGNOSIS — Z79811 Long term (current) use of aromatase inhibitors: Secondary | ICD-10-CM | POA: Diagnosis not present

## 2020-10-11 DIAGNOSIS — E785 Hyperlipidemia, unspecified: Secondary | ICD-10-CM | POA: Diagnosis not present

## 2020-10-11 DIAGNOSIS — E039 Hypothyroidism, unspecified: Secondary | ICD-10-CM | POA: Diagnosis not present

## 2020-10-11 DIAGNOSIS — Z79899 Other long term (current) drug therapy: Secondary | ICD-10-CM | POA: Diagnosis not present

## 2020-10-11 DIAGNOSIS — Z5181 Encounter for therapeutic drug level monitoring: Secondary | ICD-10-CM | POA: Diagnosis not present

## 2020-10-11 DIAGNOSIS — M8580 Other specified disorders of bone density and structure, unspecified site: Secondary | ICD-10-CM | POA: Insufficient documentation

## 2020-10-11 DIAGNOSIS — E119 Type 2 diabetes mellitus without complications: Secondary | ICD-10-CM | POA: Insufficient documentation

## 2020-10-11 LAB — COMPREHENSIVE METABOLIC PANEL
ALT: 35 U/L (ref 0–44)
AST: 36 U/L (ref 15–41)
Albumin: 4.1 g/dL (ref 3.5–5.0)
Alkaline Phosphatase: 148 U/L — ABNORMAL HIGH (ref 38–126)
Anion gap: 11 (ref 5–15)
BUN: 20 mg/dL (ref 8–23)
CO2: 25 mmol/L (ref 22–32)
Calcium: 9.1 mg/dL (ref 8.9–10.3)
Chloride: 103 mmol/L (ref 98–111)
Creatinine, Ser: 0.73 mg/dL (ref 0.44–1.00)
GFR, Estimated: >60 mL/min (ref >60)
Glucose, Bld: 140 mg/dL — ABNORMAL HIGH (ref 70–99)
Potassium: 3.8 mmol/L (ref 3.5–5.1)
Sodium: 139 mmol/L (ref 135–145)
Total Bilirubin: 0.3 mg/dL (ref 0.3–1.2)
Total Protein: 7.3 g/dL (ref 6.5–8.1)

## 2020-10-11 LAB — CBC WITH DIFFERENTIAL/PLATELET
Abs Immature Granulocytes: 0.02 10*3/uL (ref 0.00–0.07)
Basophils Absolute: 0 10*3/uL (ref 0.0–0.1)
Basophils Relative: 0 %
Eosinophils Absolute: 0.2 10*3/uL (ref 0.0–0.5)
Eosinophils Relative: 3 %
HCT: 39.7 % (ref 36.0–46.0)
Hemoglobin: 12.9 g/dL (ref 12.0–15.0)
Immature Granulocytes: 0 %
Lymphocytes Relative: 28 %
Lymphs Abs: 2 10*3/uL (ref 0.7–4.0)
MCH: 27.6 pg (ref 26.0–34.0)
MCHC: 32.5 g/dL (ref 30.0–36.0)
MCV: 85 fL (ref 80.0–100.0)
Monocytes Absolute: 0.4 10*3/uL (ref 0.1–1.0)
Monocytes Relative: 6 %
Neutro Abs: 4.6 10*3/uL (ref 1.7–7.7)
Neutrophils Relative %: 63 %
Platelets: 216 10*3/uL (ref 150–400)
RBC: 4.67 MIL/uL (ref 3.87–5.11)
RDW: 14.5 % (ref 11.5–15.5)
WBC: 7.3 10*3/uL (ref 4.0–10.5)
nRBC: 0 % (ref 0.0–0.2)

## 2020-10-11 NOTE — Progress Notes (Signed)
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  6 mm tumor with negative lymph nodes that was ER/PR positive and negative.  Patient did not adjuvant chemotherapy and went on to receive adjuvant radiation treatment and started taking Arimidex in march 2019. She gets reclast for osteopenia  Interval history-patient is doing well overall and denies any specific complaints at this time.  Appetite and weight have been stable.  Denies any new aches and pains anywhere  ECOG PS- 1 Pain scale- 0   Review of systems- Review of Systems  Constitutional:  Negative for chills, fever, malaise/fatigue and weight loss.  HENT:  Negative for congestion, ear discharge and nosebleeds.   Eyes:  Negative for blurred vision.  Respiratory:  Negative for cough, hemoptysis, sputum production, shortness of breath and wheezing.   Cardiovascular:  Negative for chest pain, palpitations, orthopnea and claudication.  Gastrointestinal:  Negative for abdominal pain, blood in stool, constipation, diarrhea, heartburn, melena, nausea and vomiting.  Genitourinary:  Negative for dysuria, flank pain, frequency, hematuria and urgency.  Musculoskeletal:  Negative for back pain, joint pain and myalgias.  Skin:  Negative for rash.  Neurological:  Negative for dizziness, tingling, focal  weakness, seizures, weakness and headaches.  Endo/Heme/Allergies:  Does not bruise/bleed easily.  Psychiatric/Behavioral:  Negative for depression and suicidal ideas. The patient does not have insomnia.     Allergies  Allergen Reactions   Amoxicillin Hives, Itching and Other (See Comments)    Has patient had a PCN reaction causing immediate rash, facial/tongue/throat swelling, SOB or lightheadedness with hypotension: No Has patient had a PCN reaction causing severe rash involving mucus membranes or skin necrosis: No Has patient had a PCN reaction that required hospitalization: No Has patient had a PCN reaction occurring within the last 10 years: No If all of the above answers are "NO", then may proceed with Cephalosporin use.    Erythromycin Hives and Itching   Sulfa Antibiotics Hives and Itching   Contrast Media [Iodinated Diagnostic Agents] Rash    Red rash and itching. Topical iodine not a problem.   Morphine Nausea And Vomiting and Other (See Comments)    Pretty severe vomiting. Can take hydrocodone and oxycodone       Past Medical History:  Diagnosis Date   Anemia    Ankle sprain 03/2017   Arthritis    Breast cancer ([place]) 2018   Breast cancer ([place]) 03/2017   Depression    Diabetes mellitus without complication ([place])    Fracture of distal end of radius 03/2017   left   Genetic testing 05/04/2017   Multi-Cancer panel (83 genes) @ [facility] - No pathogenic mutations detected   GERD (gastroesophageal reflux disease)    Headache    Hyperlipidemia    Hypothyroidism    Personal history of radiation therapy    Pre-diabetes 03/2017   follows healthy diabetic diet   Sleep  apnea    has not used cpap in a long time   Vitamin B 12 deficiency      Past Surgical History:  Procedure Laterality Date   [place]   when she had c-section   BREAST BIOPSY Left 03/27/2017   INVASIVE MAMMARY CARCINOMA Grade 1 UIG   BREAST LUMPECTOMY Left  04/13/2017   INVASIVE MAMMARY CARCINOMA/ DCIS, CLEAR MARGINS, NEG. LN   CESAREAN SECTION     COLONOSCOPY     COLONOSCOPY WITH PROPOFOL N/A 02/25/2020   Procedure: COLONOSCOPY WITH PROPOFOL;  Surgeon: [name], MD;  Location: [facility] ENDOSCOPY;  Service: Gastroenterology;  Laterality: N/A;   ESOPHAGOGASTRODUODENOSCOPY (EGD) WITH PROPOFOL N/A 02/05/2015   Procedure: ESOPHAGOGASTRODUODENOSCOPY (EGD) WITH PROPOFOL;  Surgeon: Josefine Class, MD;  Location: [facility] ENDOSCOPY;  Service: Endoscopy;  Laterality: N/A;   ESOPHAGOGASTRODUODENOSCOPY (EGD) WITH PROPOFOL N/A 02/25/2020   Procedure: ESOPHAGOGASTRODUODENOSCOPY (EGD) WITH PROPOFOL;  Surgeon: [name], MD;  Location: [facility] ENDOSCOPY;  Service: Gastroenterology;  Laterality: N/A;   PARTIAL MASTECTOMY WITH NEEDLE LOCALIZATION Left 04/13/2017   Procedure: PARTIAL MASTECTOMY WITH NEEDLE LOCALIZATION;  Surgeon: Leonie Green, MD;  Location: [facility] ORS;  Service: General;  Laterality: Left;   PATELLA FRACTURE SURGERY Left 1991   screws in place   SAVORY DILATION N/A 02/05/2015   Procedure: SAVORY DILATION;  Surgeon: Josefine Class, MD;  Location: [facility] ENDOSCOPY;  Service: Endoscopy;  Laterality: N/A;   SENTINEL NODE BIOPSY Left 04/13/2017   Procedure: SENTINEL NODE BIOPSY;  Surgeon: Leonie Green, MD;  Location: [facility] ORS;  Service: General;  Laterality: Left;   SINUS SURGERY WITH INSTATRAK      Social History   Socioeconomic History   Marital status: Married    Spouse name: Not on file   Number of children: Not on file   Years of education: Not on file   Highest education level: Not on file  Occupational History   Not on file  Tobacco Use   Smoking status: Never   Smokeless tobacco: Never  Vaping Use   Vaping Use: Never used  Substance and Sexual Activity   Alcohol use: No   Drug use: No   Sexual activity: Not Currently  Other Topics Concern   Not on file  Social History Narrative   Not on file    Social Determinants of Health   Financial Resource Strain: Not on file  Food Insecurity: Not on file  Transportation Needs: Not on file  Physical Activity: Not on file  Stress: Not on file  Social Connections: Not on file  Intimate Partner Violence: Not on file    Family History  Problem Relation Age of Onset   Breast cancer Maternal Aunt 70       deceased 51s   Breast cancer Cousin 27       daughter of mat aunt with breast cancer   Breast cancer Cousin 41       daughter of mat aunt with breast cancer   Deep vein thrombosis Mother    Heart disease Mother    Stroke Mother    Heart disease Father    Parkinson's disease Father    Heart disease Sister    Stroke Sister    Skin cancer Brother    Heart disease Brother    Alzheimer's disease Brother    Lung cancer Paternal Uncle        deceased 12s   Heart disease Brother  Alzheimer's disease Brother    Heart disease Brother    Alzheimer's disease Brother    Cancer Brother        unclear primary; currently 62s   Cirrhosis Sister    Alzheimer's disease Sister    Kidney cancer Son 52       nephrectomy @ [facility]     Current Outpatient Medications:    acetaminophen (TYLENOL) 500 MG tablet, Take 1,000 mg by mouth every 6 (six) hours as needed for moderate pain or headache., Disp: , Rfl:    amphetamine-dextroamphetamine (ADDERALL XR) 10 MG 24 hr capsule, Take 10 mg by mouth daily., Disp: , Rfl:    ARIPiprazole (ABILIFY) 15 MG tablet, Take 15 mg by mouth daily., Disp: , Rfl:    atorvastatin (LIPITOR) 40 MG tablet, Take 40 mg by mouth daily. , Disp: , Rfl:    esomeprazole (NEXIUM) 40 MG capsule, Take 40 mg by mouth daily. , Disp: , Rfl:    Estradiol 10 MCG TABS vaginal tablet, Place vaginally as needed., Disp: , Rfl:    fluticasone (FLONASE) 50 MCG/ACT nasal spray, Place 2 sprays into both nostrils daily as needed for allergies., Disp: , Rfl:    levocetirizine (XYZAL) 5 MG tablet, Take 5 mg by mouth every evening. , Disp: ,  Rfl:    levothyroxine (SYNTHROID, LEVOTHROID) 150 MCG tablet, Take 150 mcg by mouth daily before breakfast., Disp: , Rfl:    LORazepam (ATIVAN) 0.5 MG tablet, Take 0.5 mg by mouth 3 (three) times daily., Disp: , Rfl:    metFORMIN (GLUCOPHAGE-XR) 500 MG 24 hr tablet, Take 1,000 mg by mouth 2 (two) times daily., Disp: , Rfl:    mupirocin ointment (BACTROBAN) 2 %, Apply 1 application topically 3 (three) times daily as needed., Disp: , Rfl:    oxybutynin (DITROPAN-XL) 5 MG 24 hr tablet, Take 5 mg by mouth at bedtime., Disp: , Rfl:    traZODone (DESYREL) 150 MG tablet, Take 150 mg by mouth at bedtime., Disp: , Rfl:    vitamin B-12 (CYANOCOBALAMIN) 1000 MCG tablet, Take 1,000 mcg by mouth daily., Disp: , Rfl:    Vitamin D, Ergocalciferol, (DRISDOL) 50000 units CAPS capsule, Take 50,000 Units by mouth every 7 (seven) days., Disp: , Rfl:    vortioxetine HBr (TRINTELLIX) 20 MG TABS tablet, Take 20 mg by mouth daily., Disp: , Rfl:    anastrozole (ARIMIDEX) 1 MG tablet, TAKE 1 TABLET BY MOUTH EVERY DAY, Disp: 90 tablet, Rfl: 0  Physical exam:  Vitals:   10/11/20 0952  BP: 139/88  Pulse: 76  Resp: 17  Temp: (!) 97.4 F (36.3 C)  SpO2: 100%  Weight: 187 lb 14.4 oz (85.2 kg)  Height: _0  (1.702 m)   Physical Exam Constitutional:      General: She is not in acute distress. Cardiovascular:     Rate and Rhythm: Normal rate and regular rhythm.     Heart sounds: Normal heart sounds.  Pulmonary:     Effort: Pulmonary effort is normal.  Skin:    General: Skin is warm and dry.  Neurological:     Mental Status: She is alert and oriented to person, place, and time.    Breast exam was performed in seated and lying down position. Patient is status post left lumpectomy with a well-healed surgical scar. No evidence of any palpable masses. No evidence of axillary adenopathy. No evidence of any palpable masses or lumps in the right breast. No evidence of right axillary adenopathy  CMP Latest Ref Rng  & Units 10/11/2020  Glucose 70 - 99 mg/dL 140(H)  BUN 8 - 23 mg/dL 20  Creatinine 0.44 - 1.00 mg/dL 0.73  Sodium 135 - 145 mmol/L 139  Potassium 3.5 - 5.1 mmol/L 3.8  Chloride 98 - 111 mmol/L 103  CO2 22 - 32 mmol/L 25  Calcium 8.9 - 10.3 mg/dL 9.1  Total Protein 6.5 - 8.1 g/dL 7.3  Total Bilirubin 0.3 - 1.2 mg/dL 0.3  Alkaline Phos 38 - 126 U/L 148(H)  AST 15 - 41 U/L 36  ALT 0 - 44 U/L 35   CBC Latest Ref Rng & Units 10/11/2020  WBC 4.0 - 10.5 K/uL 7.3  Hemoglobin 12.0 - 15.0 g/dL 12.9  Hematocrit 36.0 - 46.0 % 39.7  Platelets 150 - 400 K/uL 216    Assessment and plan- Patient is a 70 y.o. 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  This is a routine follow-up visit for breast cancer  Overall patient is doing well with no concerning signs and symptoms of recurrence based on today's exam.  Recent mammogram from March 2022 did not show any evidence of malignancy.  She will be seen by me in 7 months to receive her next dose of Reclast which she gets every 18 months for osteopenia.  Patient will continue taking Arimidex along with calcium and vitamin D at this time.  She completes 5 years of treatment in April 2023   Visit Diagnosis 1. Malignant neoplasm of upper-outer quadrant of left breast in female, estrogen receptor positive ([place])   2. Visit for monitoring Arimidex therapy      Dr. Randa Evens, MD, MPH [facility] at [facility] [phone] 10/11/2020 4:58 PM

## 2020-10-19 ENCOUNTER — Other Ambulatory Visit: Payer: Medicare Other

## 2020-10-19 ENCOUNTER — Ambulatory Visit: Payer: Medicare Other | Admitting: Oncology

## 2020-11-26 ENCOUNTER — Encounter: Payer: Self-pay | Admitting: Oncology

## 2020-11-26 NOTE — Progress Notes (Signed)
[facility] [place], [place] [address]  Pulmonary Sleep Medicine   Office Visit Note  Patient Name: April Leblanc DOB: Mar 11, 1951 MRN [id number]    Chief Complaint: Obstructive Sleep Apnea visit  Brief History:  April Leblanc is seen today for initial consult to establish care. Current therapy is APAP @ 5 - 10 cmH2O. The patient has a 17 history of sleep apnea. Patient is using PAP nightly.  The patient feels refreshed after sleeping with PAP.  The patient reports benefiting from PAP use.  and the Epworth Sleepiness Score is 3 out of 24. The patient rarely, like for migraines.   take naps. The patient complains of the following: Dreamstation is under recall, but not eligible for replacement until 2024.  The compliance download shows  compliance with an average use time of 6:43 hours @ 87.7%. The AHI is 0.6  The patient does not complain of limb movements disrupting sleep.  ROS  General: (-) fever, (-) chills, (-) night sweat Nose and Sinuses: (-) nasal stuffiness or itchiness, (-) postnasal drip, (-) nosebleeds, (-) sinus trouble. Mouth and Throat: (-) sore throat, (-) hoarseness. Neck: (-) swollen glands, (-) enlarged thyroid, (-) neck pain. Respiratory: - cough, - shortness of breath, - wheezing. Neurologic: - numbness, - tingling. Psychiatric: + anxiety, + depression   Current Medication: Outpatient Encounter Medications as of 11/29/2020  Medication Sig   acetaminophen (TYLENOL) 500 MG tablet Take 1,000 mg by mouth every 6 (six) hours as needed for moderate pain or headache.   amphetamine-dextroamphetamine (ADDERALL XR) 10 MG 24 hr capsule Take 10 mg by mouth daily.   anastrozole (ARIMIDEX) 1 MG tablet TAKE 1 TABLET BY MOUTH EVERY DAY   ARIPiprazole (ABILIFY) 15 MG tablet Take 15 mg by mouth daily.   atorvastatin (LIPITOR) 40 MG tablet Take 40 mg by mouth daily.    esomeprazole (NEXIUM) 40 MG capsule Take 40 mg by mouth daily.    Estradiol 10 MCG  TABS vaginal tablet Place vaginally as needed.   fluticasone (FLONASE) 50 MCG/ACT nasal spray Place 2 sprays into both nostrils daily as needed for allergies.   levocetirizine (XYZAL) 5 MG tablet Take 5 mg by mouth every evening.    levothyroxine (SYNTHROID, LEVOTHROID) 150 MCG tablet Take 150 mcg by mouth daily before breakfast.   LORazepam (ATIVAN) 0.5 MG tablet Take 0.5 mg by mouth 3 (three) times daily.   metFORMIN (GLUCOPHAGE-XR) 500 MG 24 hr tablet Take 1,000 mg by mouth 2 (two) times daily.   mupirocin ointment (BACTROBAN) 2 % Apply 1 application topically 3 (three) times daily as needed.   oxybutynin (DITROPAN-XL) 5 MG 24 hr tablet Take 5 mg by mouth at bedtime.   traZODone (DESYREL) 150 MG tablet Take 150 mg by mouth at bedtime.   vitamin B-12 (CYANOCOBALAMIN) 1000 MCG tablet Take 1,000 mcg by mouth daily.   vortioxetine HBr (TRINTELLIX) 20 MG TABS tablet Take 20 mg by mouth daily.   [DISCONTINUED] Vitamin D, Ergocalciferol, (DRISDOL) 50000 units CAPS capsule Take 50,000 Units by mouth every 7 (seven) days.   No facility-administered encounter medications on file as of 11/29/2020.    Surgical History: Past Surgical History:  Procedure Laterality Date   [place]   when she had c-section   BREAST BIOPSY Left 03/27/2017   INVASIVE MAMMARY CARCINOMA Grade 1 UIG   BREAST LUMPECTOMY Left 04/13/2017   INVASIVE MAMMARY CARCINOMA/ DCIS, CLEAR MARGINS, NEG. LN   CESAREAN SECTION  COLONOSCOPY     COLONOSCOPY WITH PROPOFOL N/A 02/25/2020   Procedure: COLONOSCOPY WITH PROPOFOL;  Surgeon: [name], MD;  Location: [facility] ENDOSCOPY;  Service: Gastroenterology;  Laterality: N/A;   ESOPHAGOGASTRODUODENOSCOPY (EGD) WITH PROPOFOL N/A 02/05/2015   Procedure: ESOPHAGOGASTRODUODENOSCOPY (EGD) WITH PROPOFOL;  Surgeon: Josefine Class, MD;  Location: [facility] ENDOSCOPY;  Service: Endoscopy;  Laterality: N/A;   ESOPHAGOGASTRODUODENOSCOPY (EGD) WITH  PROPOFOL N/A 02/25/2020   Procedure: ESOPHAGOGASTRODUODENOSCOPY (EGD) WITH PROPOFOL;  Surgeon: [name], MD;  Location: [facility] ENDOSCOPY;  Service: Gastroenterology;  Laterality: N/A;   PARTIAL MASTECTOMY WITH NEEDLE LOCALIZATION Left 04/13/2017   Procedure: PARTIAL MASTECTOMY WITH NEEDLE LOCALIZATION;  Surgeon: Leonie Green, MD;  Location: [facility] ORS;  Service: General;  Laterality: Left;   PATELLA FRACTURE SURGERY Left 1991   screws in place   SAVORY DILATION N/A 02/05/2015   Procedure: SAVORY DILATION;  Surgeon: Josefine Class, MD;  Location: [facility] ENDOSCOPY;  Service: Endoscopy;  Laterality: N/A;   SENTINEL NODE BIOPSY Left 04/13/2017   Procedure: SENTINEL NODE BIOPSY;  Surgeon: Leonie Green, MD;  Location: [facility] ORS;  Service: General;  Laterality: Left;   SINUS SURGERY WITH INSTATRAK      Medical History: Past Medical History:  Diagnosis Date   Anemia    Ankle sprain 03/2017   Arthritis    Breast cancer ([place]) 2018   Breast cancer ([place]) 03/2017   Depression    Diabetes mellitus without complication ([place])    Fracture of distal end of radius 03/2017   left   Genetic testing 05/04/2017   Multi-Cancer panel (83 genes) @ [facility] - No pathogenic mutations detected   GERD (gastroesophageal reflux disease)    Headache    Hyperlipidemia    Hypothyroidism    Personal history of radiation therapy    Pre-diabetes 03/2017   follows healthy diabetic diet   Sleep apnea    has not used cpap in a long time   Vitamin B 12 deficiency     Family History: Non contributory to the present illness  Social History: Social History   Socioeconomic History   Marital status: Married    Spouse name: Not on file   Number of children: Not on file   Years of education: Not on file   Highest education level: Not on file  Occupational History   Not on file  Tobacco Use   Smoking status: Never   Smokeless tobacco: Never  Vaping Use   Vaping Use: Never used   Substance and Sexual Activity   Alcohol use: No   Drug use: No   Sexual activity: Not Currently  Other Topics Concern   Not on file  Social History Narrative   Not on file   Social Determinants of Health   Financial Resource Strain: Not on file  Food Insecurity: Not on file  Transportation Needs: Not on file  Physical Activity: Not on file  Stress: Not on file  Social Connections: Not on file  Intimate Partner Violence: Not on file    Vital Signs: Blood pressure 130/83, pulse 83, resp. rate 16, height 5' 7.5" (1.715 m), weight 185 lb 8 oz (84.1 kg), SpO2 97 %.  Examination: General Appearance: The patient is well-developed, well-nourished, and in no distress. Neck Circumference: 36 cm Skin: Gross inspection of skin unremarkable. Head: normocephalic, no gross deformities. Eyes: no gross deformities noted. ENT: ears appear grossly normal Neurologic: Alert and oriented. No involuntary movements.    EPWORTH SLEEPINESS SCALE:  Scale:  (  0)= no chance of dozing; (1)= slight chance of dozing; (2)= moderate chance of dozing; (3)= high chance of dozing  Chance  Situtation    Sitting and reading: 1    Watching TV: 1    Sitting Inactive in public: 0    As a passenger in car: 0      Lying down to rest: 1    Sitting and talking: 0    Sitting quielty after lunch: 0    In a car, stopped in traffic: 0   TOTAL SCORE:   3 out of 24    SLEEP STUDIES:  PSG 07-02-03 AHI 11 SpO67mn 85%   CPAP COMPLIANCE DATA:  Date Range: 11/25/19 - 11/23/20  Average Daily Use: 6:43 hours  Median Use: 7:26 hours  Compliance for > 4 Hours: 87.7% days  AHI: 0.6 respiratory events per hour  Days Used: 330/365 days  Mask Large Leak: average 1 min, 8 seconds per day  90th Percentile Pressure: 7.5 cmH2O         LABS: Recent Results (from the past 2160 hour(s))  Comprehensive metabolic panel     Status: Abnormal   Collection Time: 10/11/20  9:11 AM  Result Value Ref  Range   Sodium 139 135 - 145 mmol/L   Potassium 3.8 3.5 - 5.1 mmol/L   Chloride 103 98 - 111 mmol/L   CO2 25 22 - 32 mmol/L   Glucose, Bld 140 (H) 70 - 99 mg/dL    Comment: Glucose reference range applies only to samples taken after fasting for at least 8 hours.   BUN 20 8 - 23 mg/dL   Creatinine, Ser 0.73 0.44 - 1.00 mg/dL   Calcium 9.1 8.9 - 10.3 mg/dL   Total Protein 7.3 6.5 - 8.1 g/dL   Albumin 4.1 3.5 - 5.0 g/dL   AST 36 15 - 41 U/L   ALT 35 0 - 44 U/L   Alkaline Phosphatase 148 (H) 38 - 126 U/L   Total Bilirubin 0.3 0.3 - 1.2 mg/dL   GFR, Estimated >60 >60 mL/min    Comment: (NOTE) Calculated using the CKD-EPI Creatinine Equation (2021)    Anion gap 11 5 - 15    Comment: [facility] [address], [place] [id number] CBC with Differential/Platelet     Status: None   Collection Time: 10/11/20  9:11 AM  Result Value Ref Range   WBC 7.3 4.0 - 10.5 K/uL   RBC 4.67 3.87 - 5.11 MIL/uL   Hemoglobin 12.9 12.0 - 15.0 g/dL   HCT 39.7 36.0 - 46.0 %   MCV 85.0 80.0 - 100.0 fL   MCH 27.6 26.0 - 34.0 pg   MCHC 32.5 30.0 - 36.0 g/dL   RDW 14.5 11.5 - 15.5 %   Platelets 216 150 - 400 K/uL   nRBC 0.0 0.0 - 0.2 %   Neutrophils Relative % 63 %   Neutro Abs 4.6 1.7 - 7.7 K/uL   Lymphocytes Relative 28 %   Lymphs Abs 2.0 0.7 - 4.0 K/uL   Monocytes Relative 6 %   Monocytes Absolute 0.4 0.1 - 1.0 K/uL   Eosinophils Relative 3 %   Eosinophils Absolute 0.2 0.0 - 0.5 K/uL   Basophils Relative 0 %   Basophils Absolute 0.0 0.0 - 0.1 K/uL   Immature Granulocytes 0 %   Abs Immature Granulocytes 0.02 0.00 - 0.07 K/uL    Comment: [facility] at [facility] [address]  [place], [place] [address]    Radiology: No results found.  No results found.  No results found.    Assessment and Plan: Patient Active Problem List   Diagnosis Date Noted   OSA on CPAP 11/29/2020   CPAP use counseling 11/29/2020   NASH (nonalcoholic steatohepatitis)  08/23/2020   Type 2 diabetes mellitus without complication, without long-term current use of insulin ([place]) 08/23/2020   Acute esophagitis    Depression 04/17/2019   Migraine headache 04/17/2019   Blepharoconjunctivitis of both eyes 09/18/2018   Dyshidrotic eczema 09/18/2018   Floaters, bilateral 09/18/2018   OAB (overactive bladder) 09/18/2018   Positive ANA (antinuclear antibody) 09/18/2018   Prediabetes 09/18/2018   Urethral caruncle 09/18/2018   Osteopenia 10/15/2017   Genetic testing 05/04/2017   Malignant neoplasm of upper-outer quadrant of left breast in female, estrogen receptor positive ([place]) 04/02/2017   Goals of care, counseling/discussion 04/02/2017   Mixed incontinence urge and stress 02/05/2016   Weakness 06/30/2015   Adhesive capsulitis of left shoulder 08/26/2014   Tendinitis of both rotator cuffs 08/26/2014   Vitamin B12 deficiency 08/25/2014   Bilateral shoulder pain 07/17/2014   Aphasia 10/06/2013   Gait disturbance 10/06/2013   Memory loss 10/06/2013   GERD (gastroesophageal reflux disease) 12/19/2011   Hypercholesterolemia 12/19/2011   Hypothyroidism 12/19/2011   Sleep apnea 12/19/2011   1. OSA on CPAP The patient does tolerate PAP and reports definite benefit from PAP use. The patient was reminded how to clean equipment and advised to replace supplies routinely. The patient was also counselled on weight loss. The compliance is very good. The AHI is 0.6.   OSA- continue cpap with very good compliance.    2. CPAP use counseling CPAP Counseling: had a lengthy discussion with the patient regarding the importance of PAP therapy in management of the sleep apnea. Patient appears to understand the risk factor reduction and also understands the risks associated with untreated sleep apnea. Patient will try to make a good faith effort to remain compliant with therapy. Also instructed the patient on proper cleaning of the device including the water must be changed daily  if possible and use of distilled water is preferred. Patient understands that the machine should be regularly cleaned with appropriate recommended cleaning solutions that do not damage the PAP machine for example given white vinegar and water rinses. Other methods such as ozone treatment may not be as good as these simple methods to achieve cleaning.   3. Gastroesophageal reflux disease without esophagitis Controlled with nexium. Continue to avoid dietary triggers.    General Counseling: I have discussed the findings of the evaluation and examination with Araina.  I have also discussed any further diagnostic evaluation thatmay be needed or ordered today. Charm verbalizes understanding of the findings of todays visit. We also reviewed her medications today and discussed drug interactions and side effects including but not limited excessive drowsiness and altered mental states. We also discussed that there is always a risk not just to her but also people around her. she has been encouraged to call the office with any questions or concerns that should arise related to todays visit.  No orders of the defined types were placed in this encounter.       I have personally obtained a history, examined the patient, evaluated laboratory and imaging results, formulated the assessment and plan and placed orders.   This patient was seen today by Tressie Ellis, PA-C in collaboration with Dr. Devona Konig.    Delanie Tirrell A  Humphrey Rolls, MD [facility] Diplomate ABMS Pulmonary and Critical Care Medicine Sleep medicine

## 2020-11-29 ENCOUNTER — Ambulatory Visit (INDEPENDENT_AMBULATORY_CARE_PROVIDER_SITE_OTHER): Payer: Medicare Other | Admitting: Internal Medicine

## 2020-11-29 ENCOUNTER — Other Ambulatory Visit: Payer: Self-pay

## 2020-11-29 VITALS — BP 130/83 | HR 83 | Resp 16 | Ht 67.5 in | Wt 185.5 lb

## 2020-11-29 DIAGNOSIS — G4733 Obstructive sleep apnea (adult) (pediatric): Secondary | ICD-10-CM

## 2020-11-29 DIAGNOSIS — Z7189 Other specified counseling: Secondary | ICD-10-CM | POA: Diagnosis not present

## 2020-11-29 DIAGNOSIS — K219 Gastro-esophageal reflux disease without esophagitis: Secondary | ICD-10-CM

## 2020-11-29 DIAGNOSIS — Z9989 Dependence on other enabling machines and devices: Secondary | ICD-10-CM

## 2020-11-29 NOTE — Patient Instructions (Signed)

## 2020-12-07 ENCOUNTER — Ambulatory Visit: Payer: Medicare Other | Admitting: Gastroenterology

## 2020-12-15 ENCOUNTER — Ambulatory Visit (INDEPENDENT_AMBULATORY_CARE_PROVIDER_SITE_OTHER): Payer: Medicare Other | Admitting: Gastroenterology

## 2020-12-15 ENCOUNTER — Other Ambulatory Visit: Payer: Self-pay

## 2020-12-15 ENCOUNTER — Encounter: Payer: Self-pay | Admitting: Gastroenterology

## 2020-12-15 VITALS — BP 142/86 | HR 90 | Temp 98.2°F | Ht 67.5 in | Wt 185.0 lb

## 2020-12-15 DIAGNOSIS — R7989 Other specified abnormal findings of blood chemistry: Secondary | ICD-10-CM

## 2020-12-15 DIAGNOSIS — K760 Fatty (change of) liver, not elsewhere classified: Secondary | ICD-10-CM | POA: Diagnosis not present

## 2020-12-15 NOTE — Progress Notes (Signed)
April Darby, MD [address]  [place]  [place], [place] [address]  Main: [phone]  Fax: [phone]    Gastroenterology Consultation  Referring Provider:     Gustavo Lah, MD Primary Care Physician:  Gustavo Lah, MD Primary Gastroenterologist:  Dr. Cephas Leblanc Reason for Consultation:    Fatty liver        HPI:   April Leblanc is a 70 y.o. female referred by Dr. Arlington Calix, Fanny Dance, MD  for consultation & management of elevated LFTs.  Patient has history of obesity, hypothyroidism, hyperlipidemia, chronic GERD, diabetes is noted to have elevated alkaline phosphatase since 01/2012.  Most recently, she is also found to have elevated AST and ALT, 45/65 in 09/2019, increased to 64/79 in 10/2019.  Unknown hepatitis B and C status.  ANA negative, ceruloplasmin levels were normal in the past.  Right upper quadrant ultrasound revealed gallbladder polyp as well as fatty liver. Patient has history of chronic GERD, underwent EGD in 01/2015 by Dr. Rayann Heman, found to have LA grade B esophagitis and medium size hiatal hernia.  Patient has been on Nexium 40 mg daily since then.  She remains asymptomatic on Nexium.  She denies any lower GI symptoms. She reports having blood transfusion several years ago.  She denies history of IV drug abuse or any other high risk behaviors.  She lost about 7 pounds by following healthy diet.  She has cut back on snacking.  She does drink diet soda a few times a week.  Follow-up visit 05/04/2020 Patient denies any GI symptoms today.  She underwent work-up for elevated liver enzymes.  Secondary liver disease work-up is negative.  Patient reports that she has not been maintaining healthy diet during holidays.  She said she has slacked off on it.  Follow-up visit 08/17/2020 Patient is here for follow-up of fatty liver.  Her fatty liver disease is biopsy-proven.  Secondary liver disease work-up is negative.  She reports that she is having hard  time managing healthy diet by herself.  She is requesting if she can be seen by a dietitian for education.  Follow-up visit 12/15/2020 Patient is here for follow-up of fatty liver.  She has done remarkably well and her transaminases have normalized.  She is started on metformin 1000 mg twice daily and her hemoglobin A1c has improved from 9.7 to 6.8.  She also saw dietitian which she found it helpful.  Patient does not have any GI concerns today  NSAIDs: None  Antiplts/Anticoagulants/Anti thrombotics: None  Patient did not undergo colonoscopy in the past GI Procedures: EGD 02/05/2015 - Medium-sized hiatus hernia. - LA Grade A reflux esophagitis. Biopsied. - Discolored, granular mucosa in the esophagus. Biopsied. - Normal stomach. - Normal examined duodenum. DIAGNOSIS:  A. GEJ; COLD BIOPSY:  - SQUAMOCOLUMNAR MUCOSA WITH MILD GASTRITIS, COMPATIBLE WITH REFLUX.  - NEGATIVE FOR DYSPLASIA AND MALIGNANCY.   B. ESOPHAGUS ABNORMAL MUCOSA, AT 17 CM; COLD BIOPSY:  - REFLUX GASTROESOPHAGITIS.  - NEGATIVE FOR DYSPLASIA AND MALIGNANCY.  EGD and colonoscopy 02/25/2020 - Normal esophagus and gastroesophageal junction. - Normal stomach. - Normal examined duodenum. - Medium-sized hiatal hernia. - Esophagogastric landmarks identified. - No specimens collected.  - One diminutive polyp in the ascending colon, removed with a cold biopsy forceps. Resected and retrieved. - One 4 mm polyp in the transverse colon, removed with a cold snare. Resected and retrieved. - The examination was otherwise normal. - Non-bleeding external hemorrhoids. - A single non-bleeding colonic  angiodysplastic lesion.  DIAGNOSIS:  A. COLON POLYP, ASCENDING; COLD BIOPSY:  - TUBULAR ADENOMA.  - NEGATIVE FOR HIGH-GRADE DYSPLASIA AND MALIGNANCY.   B.  COLON POLYP, TRANSVERSE; COLD SNARE:  - TUBULAR ADENOMA.  - NEGATIVE FOR HIGH-GRADE DYSPLASIA AND MALIGNANCY.   Ultrasound-guided liver biopsy 05/11/2020 DIAGNOSIS:  A.  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  - NAS SCORE 4 OF 8       - STEATOSIS - SCORE 2 (>33-66%)       - LOBULAR INFLAMMATION - SCORE 1       - HEPATOCYTE BALLONING - SCORE 1 (FEW BALLOON CELLS)  - MILD PERICELLULAR FIBROSIS (STAGE 1A OF 4)   Past Medical History:  Diagnosis Date   Anemia    Ankle sprain 03/2017   Arthritis    Breast cancer ([place]) 2018   Breast cancer ([place]) 03/2017   Depression    Diabetes mellitus without complication ([place])    Fracture of distal end of radius 03/2017   left   Genetic testing 05/04/2017   Multi-Cancer panel (83 genes) @ [facility] - No pathogenic mutations detected   GERD (gastroesophageal reflux disease)    Headache    Hyperlipidemia    Hypothyroidism    Personal history of radiation therapy    Pre-diabetes 03/2017   follows healthy diabetic diet   Sleep apnea    has not used cpap in a long time   Vitamin B 12 deficiency     Past Surgical History:  Procedure Laterality Date   [place]   when she had c-section   BREAST BIOPSY Left 03/27/2017   INVASIVE MAMMARY CARCINOMA Grade 1 UIG   BREAST LUMPECTOMY Left 04/13/2017   INVASIVE MAMMARY CARCINOMA/ DCIS, CLEAR MARGINS, NEG. LN   CESAREAN SECTION     COLONOSCOPY     COLONOSCOPY WITH PROPOFOL N/A 02/25/2020   Procedure: COLONOSCOPY WITH PROPOFOL;  Surgeon: [name], MD;  Location: [facility] ENDOSCOPY;  Service: Gastroenterology;  Laterality: N/A;   ESOPHAGOGASTRODUODENOSCOPY (EGD) WITH PROPOFOL N/A 02/05/2015   Procedure: ESOPHAGOGASTRODUODENOSCOPY (EGD) WITH PROPOFOL;  Surgeon: Josefine Class, MD;  Location: [facility] ENDOSCOPY;  Service: Endoscopy;  Laterality: N/A;   ESOPHAGOGASTRODUODENOSCOPY (EGD) WITH PROPOFOL N/A 02/25/2020   Procedure: ESOPHAGOGASTRODUODENOSCOPY (EGD) WITH PROPOFOL;  Surgeon: [name],  MD;  Location: [facility] ENDOSCOPY;  Service: Gastroenterology;  Laterality: N/A;   PARTIAL MASTECTOMY WITH NEEDLE LOCALIZATION Left 04/13/2017   Procedure: PARTIAL MASTECTOMY WITH NEEDLE LOCALIZATION;  Surgeon: Leonie Green, MD;  Location: [facility] ORS;  Service: General;  Laterality: Left;   PATELLA FRACTURE SURGERY Left 1991   screws in place   SAVORY DILATION N/A 02/05/2015   Procedure: SAVORY DILATION;  Surgeon: Josefine Class, MD;  Location: [facility] ENDOSCOPY;  Service: Endoscopy;  Laterality: N/A;   SENTINEL NODE BIOPSY Left 04/13/2017   Procedure: SENTINEL NODE BIOPSY;  Surgeon: Leonie Green, MD;  Location: [facility] ORS;  Service: General;  Laterality: Left;   SINUS SURGERY WITH INSTATRAK      Current Outpatient Medications:    acetaminophen (TYLENOL) 500 MG tablet, Take 1,000 mg by mouth every 6 (six) hours as needed for moderate pain or headache., Disp: , Rfl:    amphetamine-dextroamphetamine (ADDERALL XR) 10 MG 24 hr capsule, Take 10 mg by mouth daily., Disp: , Rfl:    anastrozole (ARIMIDEX) 1 MG tablet, TAKE 1 TABLET BY MOUTH  EVERY DAY, Disp: 90 tablet, Rfl: 0   ARIPiprazole (ABILIFY) 15 MG tablet, Take 15 mg by mouth daily., Disp: , Rfl:    atorvastatin (LIPITOR) 40 MG tablet, Take 40 mg by mouth daily. , Disp: , Rfl:    esomeprazole (NEXIUM) 40 MG capsule, Take 40 mg by mouth daily. , Disp: , Rfl:    Estradiol 10 MCG TABS vaginal tablet, Place vaginally as needed., Disp: , Rfl:    fluticasone (FLONASE) 50 MCG/ACT nasal spray, Place 2 sprays into both nostrils daily as needed for allergies., Disp: , Rfl:    levocetirizine (XYZAL) 5 MG tablet, Take 5 mg by mouth every evening. , Disp: , Rfl:    levothyroxine (SYNTHROID, LEVOTHROID) 150 MCG tablet, Take 150 mcg by mouth daily before breakfast., Disp: , Rfl:    LORazepam (ATIVAN) 0.5 MG tablet, Take 0.5 mg by mouth 3 (three) times daily., Disp: , Rfl:    metFORMIN (GLUCOPHAGE-XR) 500 MG 24 hr tablet, Take 1,000 mg by mouth 2  (two) times daily., Disp: , Rfl:    mupirocin ointment (BACTROBAN) 2 %, Apply 1 application topically 3 (three) times daily as needed., Disp: , Rfl:    traZODone (DESYREL) 150 MG tablet, Take 150 mg by mouth at bedtime., Disp: , Rfl:    vitamin B-12 (CYANOCOBALAMIN) 1000 MCG tablet, Take 1,000 mcg by mouth daily., Disp: , Rfl:    vortioxetine HBr (TRINTELLIX) 20 MG TABS tablet, Take 20 mg by mouth daily., Disp: , Rfl:    Family History  Problem Relation Age of Onset   Breast cancer Maternal Aunt 70       deceased 49s   Breast cancer Cousin 25       daughter of mat aunt with breast cancer   Breast cancer Cousin 64       daughter of mat aunt with breast cancer   Deep vein thrombosis Mother    Heart disease Mother    Stroke Mother    Heart disease Father    Parkinson's disease Father    Heart disease Sister    Stroke Sister    Skin cancer Brother    Heart disease Brother    Alzheimer's disease Brother    Lung cancer Paternal Uncle        deceased 7s   Heart disease Brother    Alzheimer's disease Brother    Heart disease Brother    Alzheimer's disease Brother    Cancer Brother        unclear primary; currently 72s   Cirrhosis Sister    Alzheimer's disease Sister    Kidney cancer Son 8       nephrectomy @ [facility]     Social History   Tobacco Use   Smoking status: Never   Smokeless tobacco: Never  Vaping Use   Vaping Use: Never used  Substance Use Topics   Alcohol use: No   Drug use: No    Allergies as of 12/15/2020 - Review Complete 12/15/2020  Allergen Reaction Noted   Amoxicillin Hives, Itching, and Other (See Comments) 12/19/2011   Erythromycin Hives and Itching 12/19/2011   Sulfa antibiotics Hives and Itching 12/19/2011   Contrast media [iodinated diagnostic agents] Rash 02/04/2015   Morphine Nausea And Vomiting and Other (See Comments) 12/19/2011    Review of Systems:    All systems reviewed and negative except where noted in HPI.   Physical Exam:  BP  (!) 142/86 (BP Location: Left Arm, Patient Position: Sitting, Cuff Size: Normal)  Pulse 90   Temp 98.2 F (36.8 C) (Oral)   Ht 5' 7.5" (1.715 m)   Wt 185 lb (83.9 kg)   BMI 28.55 kg/m  No LMP recorded. Patient has had a hysterectomy.  General:   Alert,  Well-developed, well-nourished, pleasant and cooperative in NAD Head:  Normocephalic and atraumatic. Eyes:  Sclera clear, no icterus.   Conjunctiva pink. Ears:  Normal auditory acuity. Nose:  No deformity, discharge, or lesions. Mouth:  No deformity or lesions,oropharynx pink & moist. Neck:  Supple; no masses or thyromegaly. Lungs:  Respirations even and unlabored.  Clear throughout to auscultation.   No wheezes, crackles, or rhonchi. No acute distress. Heart:  Regular rate and rhythm; no murmurs, clicks, rubs, or gallops. Abdomen:  Normal bowel sounds. Soft, obese, non-tender and non-distended without masses, hepatosplenomegaly or hernias noted.  No guarding or rebound tenderness.   Rectal: Not performed Msk:  Symmetrical without gross deformities. Good, equal movement & strength bilaterally. Pulses:  Normal pulses noted. Extremities:  No clubbing or edema.  No cyanosis. Neurologic:  Alert and oriented x3;  grossly normal neurologically. Skin:  Intact without significant lesions or rashes. No jaundice. Lymph Nodes:  No significant cervical adenopathy. Psych:  Alert and cooperative. Normal mood and affect.  Imaging Studies: Reviewed  Assessment and Plan:   April Leblanc is a 70 y.o. Caucasian female with stage I left breast cancer maintained on Arimidex, obesity, hypothyroidism, diabetes, chronic GERD is seen in for follow-up of fatty liver  Elevated LFTs: Secondary to fatty liver disease, biopsy-proven, currently normalized Secondary liver disease work-up is negative Recheck LFTs today, if remains normal, recheck in 6 months Congratulated her on good control of diabetes Continue metformin and strict diabetic  diet  Chronic GERD Continue Nexium 40 mg daily EGD did not reveal any evidence of Barrett's.  Given history of medium size hiatal hernia, recommend long-term PPI  Tubular adenomas of the colon Recommend surveillance colonoscopy in 01/2027   Follow up in 6 months   April Darby, MD

## 2020-12-16 LAB — HEPATIC FUNCTION PANEL
ALT: 39 IU/L — ABNORMAL HIGH (ref 0–32)
AST: 36 IU/L (ref 0–40)
Albumin: 4.7 g/dL (ref 3.8–4.8)
Alkaline Phosphatase: 194 IU/L — ABNORMAL HIGH (ref 44–121)
Bilirubin Total: 0.3 mg/dL (ref 0.0–1.2)
Bilirubin, Direct: <0.1 mg/dL (ref 0.00–0.40)
Total Protein: 7.3 g/dL (ref 6.0–8.5)

## 2020-12-17 ENCOUNTER — Telehealth: Payer: Self-pay

## 2020-12-17 NOTE — Telephone Encounter (Signed)
Called patient she understood results and reminds her we would recheck in six months.

## 2020-12-17 NOTE — Telephone Encounter (Signed)
-----   Message from [name], MD sent at 12/17/2020  9:16 AM EDT ----- Please inform patient that her liver enzymes slightly remain elevated but better than before.  We will recheck in 6 months  RV

## 2021-02-09 ENCOUNTER — Other Ambulatory Visit: Payer: Self-pay | Admitting: Oncology

## 2021-03-11 ENCOUNTER — Other Ambulatory Visit: Payer: Self-pay | Admitting: Oncology

## 2021-05-06 ENCOUNTER — Inpatient Hospital Stay: Payer: Medicare Other

## 2021-05-06 ENCOUNTER — Inpatient Hospital Stay (HOSPITAL_BASED_OUTPATIENT_CLINIC_OR_DEPARTMENT_OTHER): Payer: Medicare Other | Admitting: Oncology

## 2021-05-06 ENCOUNTER — Other Ambulatory Visit: Payer: Self-pay

## 2021-05-06 ENCOUNTER — Inpatient Hospital Stay: Payer: Medicare Other | Attending: Oncology

## 2021-05-06 ENCOUNTER — Ambulatory Visit: Payer: Medicare Other

## 2021-05-06 VITALS — BP 113/71 | HR 78 | Temp 99.6°F | Resp 16 | Ht 67.5 in | Wt 186.0 lb

## 2021-05-06 DIAGNOSIS — C50412 Malignant neoplasm of upper-outer quadrant of left female breast: Secondary | ICD-10-CM | POA: Diagnosis present

## 2021-05-06 DIAGNOSIS — Z79899 Other long term (current) drug therapy: Secondary | ICD-10-CM

## 2021-05-06 DIAGNOSIS — Z170 Estrogen receptor positive status [ER+]: Secondary | ICD-10-CM | POA: Diagnosis not present

## 2021-05-06 DIAGNOSIS — Z1231 Encounter for screening mammogram for malignant neoplasm of breast: Secondary | ICD-10-CM | POA: Diagnosis not present

## 2021-05-06 LAB — CBC WITH DIFFERENTIAL/PLATELET
Abs Immature Granulocytes: 0.04 10*3/uL (ref 0.00–0.07)
Basophils Absolute: 0.1 10*3/uL (ref 0.0–0.1)
Basophils Relative: 1 %
Eosinophils Absolute: 0.3 10*3/uL (ref 0.0–0.5)
Eosinophils Relative: 3 %
HCT: 41.8 % (ref 36.0–46.0)
Hemoglobin: 13.2 g/dL (ref 12.0–15.0)
Immature Granulocytes: 1 %
Lymphocytes Relative: 27 %
Lymphs Abs: 2.2 10*3/uL (ref 0.7–4.0)
MCH: 27.4 pg (ref 26.0–34.0)
MCHC: 31.6 g/dL (ref 30.0–36.0)
MCV: 86.9 fL (ref 80.0–100.0)
Monocytes Absolute: 0.3 10*3/uL (ref 0.1–1.0)
Monocytes Relative: 4 %
Neutro Abs: 5.3 10*3/uL (ref 1.7–7.7)
Neutrophils Relative %: 64 %
Platelets: 228 10*3/uL (ref 150–400)
RBC: 4.81 MIL/uL (ref 3.87–5.11)
RDW: 14.3 % (ref 11.5–15.5)
WBC: 8.1 10*3/uL (ref 4.0–10.5)
nRBC: 0 % (ref 0.0–0.2)

## 2021-05-06 LAB — COMPREHENSIVE METABOLIC PANEL
ALT: 38 U/L (ref 0–44)
AST: 34 U/L (ref 15–41)
Albumin: 4.2 g/dL (ref 3.5–5.0)
Alkaline Phosphatase: 145 U/L — ABNORMAL HIGH (ref 38–126)
Anion gap: 8 (ref 5–15)
BUN: 15 mg/dL (ref 8–23)
CO2: 26 mmol/L (ref 22–32)
Calcium: 9.3 mg/dL (ref 8.9–10.3)
Chloride: 103 mmol/L (ref 98–111)
Creatinine, Ser: 0.82 mg/dL (ref 0.44–1.00)
GFR, Estimated: >60 mL/min (ref >60)
Glucose, Bld: 202 mg/dL — ABNORMAL HIGH (ref 70–99)
Potassium: 4.1 mmol/L (ref 3.5–5.1)
Sodium: 137 mmol/L (ref 135–145)
Total Bilirubin: 0.6 mg/dL (ref 0.3–1.2)
Total Protein: 7.3 g/dL (ref 6.5–8.1)

## 2021-05-08 ENCOUNTER — Encounter: Payer: Self-pay | Admitting: Oncology

## 2021-05-08 NOTE — Progress Notes (Signed)
Hematology/Oncology Consult note [facility]  Telephone:([phone] [phone]  Patient Care Team: Gustavo Lah, MD as PCP - General (Student) Noreene Filbert, MD as Radiation Oncologist (Radiation Oncology) Sindy Guadeloupe, MD as Consulting Physician (Oncology)   Name of the patient: April Leblanc  [id number]  [birth year]   Date of visit: 05/08/21  Diagnosis- invasive mammary of the left breast stage I a cT1b cN0 cM0 ER greater than 90% positive, PR 1% positive and HER-2/neu equivocal on IHC FISH negative    Chief complaint/ Reason for visit- routine f/u of breast cancer on arimidex  Heme/Onc history: Patient is a 71 year old female with left breast cancer diagnosed in this 2018 stage I.  6 mm tumor with negative lymph nodes that was ER/PR positive and negative.  Patient did not adjuvant chemotherapy and went on to receive adjuvant radiation treatment and started taking Arimidex in march 2019. She gets reclast for osteopenia Q18 months    Interval history-overall patient is doing well and denies any specific complaints at this time.  Has mild self-limited arthralgias secondary to Arimidex  ECOG PS- 1 Pain scale- 0   Review of systems- Review of Systems  Constitutional:  Positive for malaise/fatigue. Negative for chills, fever and weight loss.  HENT:  Negative for congestion, ear discharge and nosebleeds.   Eyes:  Negative for blurred vision.  Respiratory:  Negative for cough, hemoptysis, sputum production, shortness of breath and wheezing.   Cardiovascular:  Negative for chest pain, palpitations, orthopnea and claudication.  Gastrointestinal:  Negative for abdominal pain, blood in stool, constipation, diarrhea, heartburn, melena, nausea and vomiting.  Genitourinary:  Negative for dysuria, flank pain, frequency, hematuria and urgency.  Musculoskeletal:  Negative for back pain, joint pain and myalgias.  Skin:  Negative for rash.   Neurological:  Negative for dizziness, tingling, focal weakness, seizures, weakness and headaches.  Endo/Heme/Allergies:  Does not bruise/bleed easily.  Psychiatric/Behavioral:  Negative for depression and suicidal ideas. The patient does not have insomnia.       Allergies  Allergen Reactions   Amoxicillin Hives, Itching and Other (See Comments)    Has patient had a PCN reaction causing immediate rash, facial/tongue/throat swelling, SOB or lightheadedness with hypotension: No Has patient had a PCN reaction causing severe rash involving mucus membranes or skin necrosis: No Has patient had a PCN reaction that required hospitalization: No Has patient had a PCN reaction occurring within the last 10 years: No If all of the above answers are "NO", then may proceed with Cephalosporin use.    Erythromycin Hives and Itching   Sulfa Antibiotics Hives and Itching   Contrast Media [Iodinated Contrast Media] Rash    Red rash and itching. Topical iodine not a problem.   Morphine Nausea And Vomiting and Other (See Comments)    Pretty severe vomiting. Can take hydrocodone and oxycodone       Past Medical History:  Diagnosis Date   Anemia    Ankle sprain 03/2017   Arthritis    Breast cancer ([place]) 2018   Breast cancer ([place]) 03/2017   Depression    Diabetes mellitus without complication ([place])    Fracture of distal end of radius 03/2017   left   Genetic testing 05/04/2017   Multi-Cancer panel (83 genes) @ [facility] - No pathogenic mutations detected   GERD (gastroesophageal reflux disease)    Headache    Hyperlipidemia    Hypothyroidism    Personal history of radiation therapy  Pre-diabetes 03/2017   follows healthy diabetic diet   Sleep apnea    has not used cpap in a long time   Vitamin B 12 deficiency      Past Surgical History:  Procedure Laterality Date   [place]   when she had c-section   BREAST BIOPSY Left 03/27/2017   INVASIVE  MAMMARY CARCINOMA Grade 1 UIG   BREAST LUMPECTOMY Left 04/13/2017   INVASIVE MAMMARY CARCINOMA/ DCIS, CLEAR MARGINS, NEG. LN   CESAREAN SECTION     COLONOSCOPY     COLONOSCOPY WITH PROPOFOL N/A 02/25/2020   Procedure: COLONOSCOPY WITH PROPOFOL;  Surgeon: [name], MD;  Location: [facility] ENDOSCOPY;  Service: Gastroenterology;  Laterality: N/A;   ESOPHAGOGASTRODUODENOSCOPY (EGD) WITH PROPOFOL N/A 02/05/2015   Procedure: ESOPHAGOGASTRODUODENOSCOPY (EGD) WITH PROPOFOL;  Surgeon: Josefine Class, MD;  Location: [facility] ENDOSCOPY;  Service: Endoscopy;  Laterality: N/A;   ESOPHAGOGASTRODUODENOSCOPY (EGD) WITH PROPOFOL N/A 02/25/2020   Procedure: ESOPHAGOGASTRODUODENOSCOPY (EGD) WITH PROPOFOL;  Surgeon: [name], MD;  Location[facility] ENDOSCOPY;  Service: Gastroenterology;  Laterality: N/A;   PARTIAL MASTECTOMY WITH NEEDLE LOCALIZATION Left 04/13/2017   Procedure: PARTIAL MASTECTOMY WITH NEEDLE LOCALIZATION;  Surgeon: Leonie Green, MD;  Location: [facility] ORS;  Service: General;  Laterality: Left;   PATELLA FRACTURE SURGERY Left 1991   screws in place   SAVORY DILATION N/A 02/05/2015   Procedure: SAVORY DILATION;  Surgeon: Josefine Class, MD;  Location: [facility] ENDOSCOPY;  Service: Endoscopy;  Laterality: N/A;   SENTINEL NODE BIOPSY Left 04/13/2017   Procedure: SENTINEL NODE BIOPSY;  Surgeon: Leonie Green, MD;  Location: [facility] ORS;  Service: General;  Laterality: Left;   SINUS SURGERY WITH INSTATRAK      Social History   Socioeconomic History   Marital status: Married    Spouse name: Not on file   Number of children: Not on file   Years of education: Not on file   Highest education level: Not on file  Occupational History   Not on file  Tobacco Use   Smoking status: Never   Smokeless tobacco: Never  Vaping Use   Vaping Use: Never used  Substance and Sexual Activity   Alcohol use: No   Drug use: No   Sexual activity: Not Currently  Other Topics Concern    Not on file  Social History Narrative   Not on file   Social Determinants of Health   Financial Resource Strain: Not on file  Food Insecurity: Not on file  Transportation Needs: Not on file  Physical Activity: Not on file  Stress: Not on file  Social Connections: Not on file  Intimate Partner Violence: Not on file    Family History  Problem Relation Age of Onset   Breast cancer Maternal Aunt 70       deceased 56s   Breast cancer Cousin 39       daughter of mat aunt with breast cancer   Breast cancer Cousin 75       daughter of mat aunt with breast cancer   Deep vein thrombosis Mother    Heart disease Mother    Stroke Mother    Heart disease Father    Parkinson's disease Father    Heart disease Sister    Stroke Sister    Skin cancer Brother    Heart disease Brother    Alzheimer's disease Brother    Lung cancer Paternal Uncle  deceased 95s   Heart disease Brother    Alzheimer's disease Brother    Heart disease Brother    Alzheimer's disease Brother    Cancer Brother        unclear primary; currently 57s   Cirrhosis Sister    Alzheimer's disease Sister    Kidney cancer Son 79       nephrectomy @ [facility]     Current Outpatient Medications:    acetaminophen (TYLENOL) 500 MG tablet, Take 1,000 mg by mouth every 6 (six) hours as needed for moderate pain or headache., Disp: , Rfl:    amphetamine-dextroamphetamine (ADDERALL XR) 10 MG 24 hr capsule, Take 10 mg by mouth daily., Disp: , Rfl:    anastrozole (ARIMIDEX) 1 MG tablet, TAKE 1 TABLET BY MOUTH EVERY DAY, Disp: 90 tablet, Rfl: 0   ARIPiprazole (ABILIFY) 15 MG tablet, Take 15 mg by mouth daily., Disp: , Rfl:    atorvastatin (LIPITOR) 40 MG tablet, Take 40 mg by mouth daily. , Disp: , Rfl:    esomeprazole (NEXIUM) 40 MG capsule, Take 40 mg by mouth daily. , Disp: , Rfl:    Estradiol 10 MCG TABS vaginal tablet, Place vaginally as needed., Disp: , Rfl:    fluticasone (FLONASE) 50 MCG/ACT nasal spray, Place 2 sprays  into both nostrils daily as needed for allergies., Disp: , Rfl:    levocetirizine (XYZAL) 5 MG tablet, Take 5 mg by mouth every evening. , Disp: , Rfl:    levothyroxine (SYNTHROID, LEVOTHROID) 150 MCG tablet, Take 150 mcg by mouth daily before breakfast., Disp: , Rfl:    LORazepam (ATIVAN) 0.5 MG tablet, Take 0.5 mg by mouth 3 (three) times daily., Disp: , Rfl:    metFORMIN (GLUCOPHAGE-XR) 500 MG 24 hr tablet, Take 1,000 mg by mouth 2 (two) times daily., Disp: , Rfl:    mupirocin ointment (BACTROBAN) 2 %, Apply 1 application topically 3 (three) times daily as needed., Disp: , Rfl:    traZODone (DESYREL) 150 MG tablet, Take 150 mg by mouth at bedtime., Disp: , Rfl:    vitamin B-12 (CYANOCOBALAMIN) 1000 MCG tablet, Take 1,000 mcg by mouth daily., Disp: , Rfl:    vortioxetine HBr (TRINTELLIX) 20 MG TABS tablet, Take 20 mg by mouth daily., Disp: , Rfl:   Physical exam:  Vitals:   05/06/21 1043  BP: 113/71  Pulse: 78  Resp: 16  Temp: 99.6 F (37.6 C)  TempSrc: Tympanic  Weight: 186 lb (84.4 kg)  Height: 5' 7.5" (1.715 m)   Physical Exam Constitutional:      General: She is not in acute distress. Cardiovascular:     Rate and Rhythm: Normal rate and regular rhythm.     Heart sounds: Normal heart sounds.  Pulmonary:     Effort: Pulmonary effort is normal.     Breath sounds: Normal breath sounds.  Abdominal:     General: Bowel sounds are normal.     Palpations: Abdomen is soft.  Skin:    General: Skin is warm and dry.  Neurological:     Mental Status: She is alert and oriented to person, place, and time.   Breast exam was performed in seated and lying down position. Patient is status post left lumpectomy with a well-healed surgical scar. No evidence of any palpable masses. No evidence of axillary adenopathy. No evidence of any palpable masses or lumps in the right breast. No evidence of right axillary adenopathy   CMP Latest Ref Rng & Units 05/06/2021  Glucose  70 - 99 mg/dL 202(H)   BUN 8 - 23 mg/dL 15  Creatinine 0.44 - 1.00 mg/dL 0.82  Sodium 135 - 145 mmol/L 137  Potassium 3.5 - 5.1 mmol/L 4.1  Chloride 98 - 111 mmol/L 103  CO2 22 - 32 mmol/L 26  Calcium 8.9 - 10.3 mg/dL 9.3  Total Protein 6.5 - 8.1 g/dL 7.3  Total Bilirubin 0.3 - 1.2 mg/dL 0.6  Alkaline Phos 38 - 126 U/L 145(H)  AST 15 - 41 U/L 34  ALT 0 - 44 U/L 38   CBC Latest Ref Rng & Units 05/06/2021  WBC 4.0 - 10.5 K/uL 8.1  Hemoglobin 12.0 - 15.0 g/dL 13.2  Hematocrit 36.0 - 46.0 % 41.8  Platelets 150 - 400 K/uL 228     Assessment and plan- Patient is a 71 y.o. 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  She is here for routine follow-up:  Clinically patient is doing well with no concerning signs and symptoms of recurrence based on today's exam.  She would be due for mammogram in March 2023 which I will schedule.  Patient receives IV classed every 18 months for her osteopenia and she is due for her next dose which we will schedule as well.Patient will continue to take Arimidex along with calcium and vitamin D.  She started this in March 2019 and will continue this until March 2024     Visit Diagnosis 1. Encounter for screening mammogram for malignant neoplasm of breast   2. Malignant neoplasm of upper-outer quadrant of left breast in female, estrogen receptor positive ([place])   3. High risk medication use      Dr. Randa Evens, MD, MPH [facility] [phone] 05/08/2021 11:06 AM

## 2021-05-09 ENCOUNTER — Other Ambulatory Visit: Payer: Self-pay

## 2021-05-09 ENCOUNTER — Inpatient Hospital Stay: Payer: Medicare Other

## 2021-05-09 ENCOUNTER — Other Ambulatory Visit: Payer: Self-pay | Admitting: Oncology

## 2021-05-09 VITALS — BP 138/79 | HR 98 | Temp 96.8°F | Resp 18

## 2021-05-09 DIAGNOSIS — C50412 Malignant neoplasm of upper-outer quadrant of left female breast: Secondary | ICD-10-CM | POA: Diagnosis not present

## 2021-05-09 DIAGNOSIS — M8580 Other specified disorders of bone density and structure, unspecified site: Secondary | ICD-10-CM

## 2021-05-09 MED ORDER — SODIUM CHLORIDE 0.9 % IV SOLN
INTRAVENOUS | Status: DC
  Filled 2021-05-09: qty 250

## 2021-05-09 MED ORDER — ZOLEDRONIC ACID 5 MG/100ML IV SOLN
5.0000 mg | INTRAVENOUS | Status: DC
  Administered 2021-05-09: 5 mg via INTRAVENOUS
  Filled 2021-05-09: qty 100

## 2021-05-09 NOTE — Patient Instructions (Addendum)
[facility] AT [place]  Discharge Instructions: Thank you for choosing [facility] to provide your oncology and hematology care.  If you have a lab appointment with the [facility], please go directly to the [facility] and check in at the registration area.  Wear comfortable clothing and clothing appropriate for easy access to any Portacath or PICC line.   We strive to give you quality time with your provider. You may need to reschedule your appointment if you arrive late (15 or more minutes).  Arriving late affects you and other patients whose appointments are after yours.  Also, if you miss three or more appointments without notifying the office, you may be dismissed from the clinic at the providers discretion.      For prescription refill requests, have your pharmacy contact our office and allow 72 hours for refills to be completed.    Today you received the following chemotherapy and/or immunotherapy agents RECLAST      To help prevent nausea and vomiting after your treatment, we encourage you to take your nausea medication as directed.  BELOW ARE SYMPTOMS THAT SHOULD BE REPORTED IMMEDIATELY: *FEVER GREATER THAN 100.4 F (38 C) OR HIGHER *CHILLS OR SWEATING *NAUSEA AND VOMITING THAT IS NOT CONTROLLED WITH YOUR NAUSEA MEDICATION *UNUSUAL SHORTNESS OF BREATH *UNUSUAL BRUISING OR BLEEDING *URINARY PROBLEMS (pain or burning when urinating, or frequent urination) *BOWEL PROBLEMS (unusual diarrhea, constipation, pain near the anus) TENDERNESS IN MOUTH AND THROAT WITH OR WITHOUT PRESENCE OF ULCERS (sore throat, sores in mouth, or a toothache) UNUSUAL RASH, SWELLING OR PAIN  UNUSUAL VAGINAL DISCHARGE OR ITCHING   Items with * indicate a potential emergency and should be followed up as soon as possible or go to the Emergency Department if any problems should occur.  Please show the CHEMOTHERAPY ALERT CARD or IMMUNOTHERAPY ALERT CARD at check-in to the  Emergency Department and triage nurse.  Should you have questions after your visit or need to cancel or reschedule your appointment, please contact [facility] [place] [facility] [place]  [phone] and follow the prompts.  Office hours are 8:00 a.m. to 4:30 p.m. Monday - Friday. Please note that voicemails left after 4:00 p.m. may not be returned until the following business day.  We are closed weekends and major holidays. You have access to a nurse at all times for urgent questions. Please call the main number to the clinic [phone] and follow the prompts.  For any non-urgent questions, you may also contact your provider using MyChart. We now offer e-Visits for anyone 59 and older to request care online for non-urgent symptoms. For details visit [email or web address].   Also download the MyChart app! Go to the app store, search "MyChart", open the app, select [facility], and log in with your MyChart username and password.  Due to Covid, a mask is required upon entering the hospital/clinic. If you do not have a mask, one will be given to you upon arrival. For doctor visits, patients may have 1 support person aged 61 or older with them. For treatment visits, patients cannot have anyone with them due to current Covid guidelines and our immunocompromised population.   Zoledronic Acid Injection (Paget's Disease, Osteoporosis) What is this medication? ZOLEDRONIC ACID (ZOE le dron ik AS id) slows calcium loss from bones. It treats Paget's disease and osteoporosis. It may be used in other people at risk for bone loss. This medicine may be used for other purposes; ask your health care provider or  pharmacist if you have questions. COMMON BRAND NAME(S): Reclast, Zometa, Zometa Powder What should I tell my care team before I take this medication? They need to know if you have any of these conditions: bleeding disorder cancer dental disease kidney disease low levels of calcium in the  blood low red blood cell counts lung or breathing disease (asthma) receiving steroids like dexamethasone or prednisone an unusual or allergic reaction to zoledronic acid, other medicines, foods, dyes, or preservatives pregnant or trying to get pregnant breast-feeding How should I use this medication? This drug is injected into a vein. It is given by a health care provider in a hospital or clinic setting. A special MedGuide will be given to you before each treatment. Be sure to read this information carefully each time. Talk to your health care provider about the use of this drug in children. Special care may be needed. Overdosage: If you think you have taken too much of this medicine contact a poison control center or emergency room at once. NOTE: This medicine is only for you. Do not share this medicine with others. What if I miss a dose? Keep appointments for follow-up doses. It is important not to miss your dose. Call your health care provider if you are unable to keep an appointment. What may interact with this medication? certain antibiotics given by injection NSAIDs, medicines for pain and inflammation, like ibuprofen or naproxen some diuretics like bumetanide, furosemide teriparatide This list may not describe all possible interactions. Give your health care provider a list of all the medicines, herbs, non-prescription drugs, or dietary supplements you use. Also tell them if you smoke, drink alcohol, or use illegal drugs. Some items may interact with your medicine. What should I watch for while using this medication? Visit your health care provider for regular checks on your progress. It may be some time before you see the benefit from this drug. Some people who take this drug have severe bone, joint, or muscle pain. This drug may also increase your risk for jaw problems or a broken thigh bone. Tell your health care provider right away if you have severe pain in your jaw, bones, joints,  or muscles. Tell you health care provider if you have any pain that does not go away or that gets worse. You should make sure you get enough calcium and vitamin D while you are taking this drug. Discuss the foods you eat and the vitamins you take with your health care provider. You may need blood work done while you are taking this drug. Tell your dentist and dental surgeon that you are taking this drug. You should not have major dental surgery while on this drug. See your dentist to have a dental exam and fix any dental problems before starting this drug. Take good care of your teeth while on this drug. Make sure you see your dentist for regular follow-up appointments. What side effects may I notice from receiving this medication? Side effects that you should report to your doctor or health care provider as soon as possible: allergic reactions (skin rash, itching or hives; swelling of the face, lips, or tongue) bone pain infection (fever, chills, cough, sore throat, pain or trouble passing urine) jaw pain, especially after dental work joint pain kidney injury (trouble passing urine or change in the amount of urine) low calcium levels (fast heartbeat; muscle cramps or pain; pain, tingling, or numbness in the hands or feet; seizures) low red blood cell counts (trouble breathing; feeling faint;  lightheaded, falls; unusually weak or tired) muscle pain palpitations redness, blistering, peeling, or loosening of the skin, including inside the mouth Side effects that usually do not require medical attention (report to your doctor or health care provider if they continue or are bothersome): diarrhea eye irritation, itching, or pain fever general ill feeling or flu-like symptoms headache increase in blood pressure nausea pain, redness, or irritation at site where injected stomach pain upset stomach This list may not describe all possible side effects. Call your doctor for medical advice about side  effects. You may report side effects to FDA at 1-800-FDA-1088. Where should I keep my medication? This drug is given in a hospital or clinic. It will not be stored at home. NOTE: This sheet is a summary. It may not cover all possible information. If you have questions about this medicine, talk to your doctor, pharmacist, or health care provider.  2022 Elsevier/Gold Standard (2021-01-04 00:00:00)

## 2021-05-10 ENCOUNTER — Ambulatory Visit: Payer: Medicare Other | Admitting: Gastroenterology

## 2021-05-25 ENCOUNTER — Other Ambulatory Visit: Payer: Self-pay | Admitting: Oncology

## 2021-05-30 ENCOUNTER — Emergency Department
Admission: EM | Admit: 2021-05-30 | Discharge: 2021-05-30 | Disposition: A | Discharge status: Home / Self Care | Payer: Medicare Other | Attending: Emergency Medicine | Admitting: Emergency Medicine

## 2021-05-30 ENCOUNTER — Other Ambulatory Visit: Payer: Self-pay

## 2021-05-30 DIAGNOSIS — E039 Hypothyroidism, unspecified: Secondary | ICD-10-CM | POA: Insufficient documentation

## 2021-05-30 DIAGNOSIS — R040 Epistaxis: Secondary | ICD-10-CM | POA: Diagnosis present

## 2021-05-30 DIAGNOSIS — Z853 Personal history of malignant neoplasm of breast: Secondary | ICD-10-CM | POA: Diagnosis not present

## 2021-05-30 DIAGNOSIS — E119 Type 2 diabetes mellitus without complications: Secondary | ICD-10-CM | POA: Diagnosis not present

## 2021-05-30 MED ORDER — OXYMETAZOLINE HCL 0.05 % NA SOLN
1.0000 | Freq: Once | NASAL | Status: AC
  Administered 2021-05-30: 1 via NASAL
  Filled 2021-05-30: qty 30

## 2021-05-30 MED ORDER — LIDOCAINE VISCOUS HCL 2 % MT SOLN
15.0000 mL | Freq: Once | OROMUCOSAL | Status: AC
  Administered 2021-05-30: 15 mL via OROMUCOSAL
  Filled 2021-05-30: qty 15

## 2021-05-30 MED ORDER — CEFDINIR 300 MG PO CAPS: 300.0000 mg | ORAL_CAPSULE | Freq: Two times a day (BID) | ORAL | 0 refills | Status: AC

## 2021-05-30 NOTE — ED Triage Notes (Signed)
Pt c/o bleeding from the left nare since 730am, pt is not on blood thinners, bleeding is controlled at this time

## 2021-05-30 NOTE — Discharge Instructions (Addendum)
You have an anterior nasal packing in place.  Please follow-up with either the ENT doctors your primary care doctor or you can return to the emergency department in 2 to 3 days to have the packing removed.  Please take the antibiotic twice a day while the packing is in place.

## 2021-05-30 NOTE — ED Notes (Signed)
States she was walking out the door  nose started bleeding again

## 2021-05-30 NOTE — ED Provider Notes (Addendum)
[facility] Provider Note    Event Date/Time   First MD Initiated Contact with Patient 05/30/21 0915     (approximate)   History   Epistaxis   HPI  April Leblanc is a 71 y.o. female with past medical history of breast cancer, depression, diabetes, hypothyroidism, hyperlipidemia presents with a nosebleed.  Patient notes that over the weekend she had several nosebleeds from the left nare that she was able to stop easily.  This morning the left nare has been bleeding since around 7:30 AM, approximately an hour and a half.  It has slowed down considerably at the time of my evaluation.  She is not on blood thinners.  She denies any other bleeding including from the gingiva or in the urine or stool.    Past Medical History:  Diagnosis Date   Anemia    Ankle sprain 03/2017   Arthritis    Breast cancer ([place]) 2018   Breast cancer ([place]) 03/2017   Depression    Diabetes mellitus without complication ([place])    Fracture of distal end of radius 03/2017   left   Genetic testing 05/04/2017   Multi-Cancer panel (83 genes) @ [facility] - No pathogenic mutations detected   GERD (gastroesophageal reflux disease)    Headache    Hyperlipidemia    Hypothyroidism    Personal history of radiation therapy    Pre-diabetes 03/2017   follows healthy diabetic diet   Sleep apnea    has not used cpap in a long time   Vitamin B 12 deficiency     Patient Active Problem List   Diagnosis Date Noted   OSA on CPAP 11/29/2020   CPAP use counseling 11/29/2020   NASH (nonalcoholic steatohepatitis) 08/23/2020   Type 2 diabetes mellitus without complication, without long-term current use of insulin ([place]) 08/23/2020   Acute esophagitis    Depression 04/17/2019   Migraine headache 04/17/2019   Blepharoconjunctivitis of both eyes 09/18/2018   Dyshidrotic eczema 09/18/2018   Floaters, bilateral 09/18/2018   OAB (overactive bladder) 09/18/2018   Positive ANA (antinuclear antibody)  09/18/2018   Prediabetes 09/18/2018   Urethral caruncle 09/18/2018   Osteopenia 10/15/2017   Genetic testing 05/04/2017   Malignant neoplasm of upper-outer quadrant of left breast in female, estrogen receptor positive ([place]) 04/02/2017   Goals of care, counseling/discussion 04/02/2017   Mixed incontinence urge and stress 02/05/2016   Weakness 06/30/2015   Adhesive capsulitis of left shoulder 08/26/2014   Tendinitis of both rotator cuffs 08/26/2014   Vitamin B12 deficiency 08/25/2014   Bilateral shoulder pain 07/17/2014   Aphasia 10/06/2013   Gait disturbance 10/06/2013   Memory loss 10/06/2013   GERD (gastroesophageal reflux disease) 12/19/2011   Hypercholesterolemia 12/19/2011   Hypothyroidism 12/19/2011   Sleep apnea 12/19/2011     Physical Exam  Triage Vital Signs: ED Triage Vitals [05/30/21 0917]  Enc Vitals Group     BP (!) 134/103     Pulse Rate 95     Resp 18     Temp 98.8 F (37.1 C)     Temp Source Oral     SpO2 96 %     Weight      Height      Head Circumference      Peak Flow      Pain Score      Pain Loc      Pain Edu?      Excl. in [place]?     Most recent  vital signs: Vitals:   05/30/21 0917  BP: (!) 134/103  Pulse: 95  Resp: 18  Temp: 98.8 F (37.1 C)  SpO2: 96%     General: Awake, no distress.  CV:  Good peripheral perfusion.  Resp:  Normal effort.  Abd:  No distention.  Neuro:             Awake, Alert, Oriented x 3  Other:  Small area of erythema with scant bleeding on the anterior nasal septum of the left nare, no blood in the posterior oropharynx   ED Results / Procedures / Treatments  Labs (all labs ordered are listed, but only abnormal results are displayed) Labs Reviewed - No data to display   EKG     RADIOLOGY    PROCEDURES:  Critical Care performed: No   MEDICATIONS ORDERED IN ED: Medications  oxymetazoline (AFRIN) 0.05 % nasal spray 1 spray (1 spray Each Nare Given by Other 05/30/21 0944)     IMPRESSION / MDM  / [place] / ED COURSE  I reviewed the triage vital signs and the nursing notes.                              Differential diagnosis includes, but is not limited to, anterior epistaxis  71 year old female presents with left-sided epistaxis.  She had had intermittent bleeding from the left nare over the last several days that was easily controlled but for the last 2 hours has not been able to get the bleeding to stop.  However on arrival to the ED bleeding considerably slowed.  She is not on blood thinners and denies any other bleeding elsewhere.  On exam I do see a likely source on the left anterior nasal septum and there is no active bleeding at this time.  Will administer Afrin and observed in the ED to ensure that bleeding does not come back.  Given bleeding has basically stopped would like to avoid nasal packing.  Patient was observed in the ED and there was no recurrent bleeding.  She was discharged with a bottle of Afrin and nasal clamp and hand and advised how to use the clamp if bleeding returns.  She is appropriate for discharge.   Patient was walking to her car when she had recurrent nasal bleeding.  She returned to the emergency department and the clamp was placed.  Despite continuous pressure she continued to have a slow trickle from the left nare.  A Rhino Rocket was placed.  She was discharged with prophylactic antibiotics.  ENT follow-up.      FINAL CLINICAL IMPRESSION(S) / ED DIAGNOSES   Final diagnoses:  None     Rx / DC Orders   ED Discharge Orders     None        Note:  This document was prepared using Dragon voice recognition software and may include unintentional dictation errors.   Rada Hay, MD 05/30/21 [id number]    Rada Hay, MD 05/30/21 1136

## 2021-06-15 ENCOUNTER — Other Ambulatory Visit: Payer: Self-pay

## 2021-06-15 ENCOUNTER — Ambulatory Visit (INDEPENDENT_AMBULATORY_CARE_PROVIDER_SITE_OTHER): Payer: Medicare Other | Admitting: Gastroenterology

## 2021-06-15 ENCOUNTER — Encounter: Payer: Self-pay | Admitting: Gastroenterology

## 2021-06-15 VITALS — BP 145/88 | HR 84 | Temp 98.5°F | Ht 67.5 in | Wt 182.1 lb

## 2021-06-15 DIAGNOSIS — R7989 Other specified abnormal findings of blood chemistry: Secondary | ICD-10-CM

## 2021-06-15 DIAGNOSIS — K760 Fatty (change of) liver, not elsewhere classified: Secondary | ICD-10-CM

## 2021-06-15 DIAGNOSIS — K449 Diaphragmatic hernia without obstruction or gangrene: Secondary | ICD-10-CM | POA: Diagnosis not present

## 2021-06-15 NOTE — Progress Notes (Signed)
Cephas Darby, MD [address]  [place]  [place], [place] [address]  Main: [phone]  Fax: [phone]    Gastroenterology Consultation  Referring Provider:     Gustavo Lah, MD Primary Care Physician:  Gustavo Lah, MD Primary Gastroenterologist:  Dr. Cephas Darby Reason for Consultation:    Fatty liver        HPI:   April Leblanc is a 71 y.o. female referred by Dr. Arlington Calix, Fanny Dance, MD  for consultation & management of elevated LFTs.  Patient has history of obesity, hypothyroidism, hyperlipidemia, chronic GERD, diabetes is noted to have elevated alkaline phosphatase since 01/2012.  Most recently, she is also found to have elevated AST and ALT, 45/65 in 09/2019, increased to 64/79 in 10/2019.  Unknown hepatitis B and C status.  ANA negative, ceruloplasmin levels were normal in the past.  Right upper quadrant ultrasound revealed gallbladder polyp as well as fatty liver. Patient has history of chronic GERD, underwent EGD in 01/2015 by Dr. Rayann Heman, found to have LA grade B esophagitis and medium size hiatal hernia.  Patient has been on Nexium 40 mg daily since then.  She remains asymptomatic on Nexium.  She denies any lower GI symptoms. She reports having blood transfusion several years ago.  She denies history of IV drug abuse or any other high risk behaviors.  She lost about 7 pounds by following healthy diet.  She has cut back on snacking.  She does drink diet soda a few times a week.  Follow-up visit 05/04/2020 Patient denies any GI symptoms today.  She underwent work-up for elevated liver enzymes.  Secondary liver disease work-up is negative.  Patient reports that she has not been maintaining healthy diet during holidays.  She said she has slacked off on it.  Follow-up visit 08/17/2020 Patient is here for follow-up of fatty liver.  Her fatty liver disease is biopsy-proven.  Secondary liver disease work-up is negative.  She reports that she is having hard  time managing healthy diet by herself.  She is requesting if she can be seen by a dietitian for education.  Follow-up visit 12/15/2020 Patient is here for follow-up of fatty liver.  She has done remarkably well and her transaminases have normalized.  She is started on metformin 1000 mg twice daily and her hemoglobin A1c has improved from 9.7 to 6.8.  She also saw dietitian which she found it helpful.  Patient does not have any GI concerns today  Follow-up visit 06/15/2021 Patient is here for follow-up of fatty liver and hiatal hernia, chronic GERD.  Patient is doing well with regards to her liver enzymes.  She had her follow-up LFTs in 05/2021 which continue to improve.  Had A1c is 6.8.  She lost 4 more pounds since last visit.  She continues to take Nexium 40 mg daily.  She does not have any GI concerns today  NSAIDs: None  Antiplts/Anticoagulants/Anti thrombotics: None  Patient did not undergo colonoscopy in the past GI Procedures: EGD 02/05/2015 - Medium-sized hiatus hernia. - LA Grade A reflux esophagitis. Biopsied. - Discolored, granular mucosa in the esophagus. Biopsied. - Normal stomach. - Normal examined duodenum. DIAGNOSIS:  A. GEJ; COLD BIOPSY:  - SQUAMOCOLUMNAR MUCOSA WITH MILD GASTRITIS, COMPATIBLE WITH REFLUX.  - NEGATIVE FOR DYSPLASIA AND MALIGNANCY.   B. ESOPHAGUS ABNORMAL MUCOSA, AT 17 CM; COLD BIOPSY:  - REFLUX GASTROESOPHAGITIS.  - NEGATIVE FOR DYSPLASIA AND MALIGNANCY.  EGD and colonoscopy 02/25/2020 - Normal esophagus  and gastroesophageal junction. - Normal stomach. - Normal examined duodenum. - Medium-sized hiatal hernia. - Esophagogastric landmarks identified. - No specimens collected.  - One diminutive polyp in the ascending colon, removed with a cold biopsy forceps. Resected and retrieved. - One 4 mm polyp in the transverse colon, removed with a cold snare. Resected and retrieved. - The examination was otherwise normal. - Non-bleeding external  hemorrhoids. - A single non-bleeding colonic angiodysplastic lesion.  DIAGNOSIS:  A. COLON POLYP, ASCENDING; COLD BIOPSY:  - TUBULAR ADENOMA.  - NEGATIVE FOR HIGH-GRADE DYSPLASIA AND MALIGNANCY.   B.  COLON POLYP, TRANSVERSE; COLD SNARE:  - TUBULAR ADENOMA.  - NEGATIVE FOR HIGH-GRADE DYSPLASIA AND MALIGNANCY.   Ultrasound-guided liver biopsy 05/11/2020 DIAGNOSIS:  A. 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  - NAS SCORE 4 OF 8       - STEATOSIS - SCORE 2 (>33-66%)       - LOBULAR INFLAMMATION - SCORE 1       - HEPATOCYTE BALLONING - SCORE 1 (FEW BALLOON CELLS)  - MILD PERICELLULAR FIBROSIS (STAGE 1A OF 4)   Past Medical History:  Diagnosis Date   Anemia    Ankle sprain 03/2017   Arthritis    Breast cancer ([place]) 2018   Breast cancer ([place]) 03/2017   Depression    Diabetes mellitus without complication ([place])    Fracture of distal end of radius 03/2017   left   Genetic testing 05/04/2017   Multi-Cancer panel (83 genes) @ [facility] - No pathogenic mutations detected   GERD (gastroesophageal reflux disease)    Headache    Hyperlipidemia    Hypothyroidism    Personal history of radiation therapy    Pre-diabetes 03/2017   follows healthy diabetic diet   Sleep apnea    has not used cpap in a long time   Vitamin B 12 deficiency     Past Surgical History:  Procedure Laterality Date   [place]   when she had c-section   BREAST BIOPSY Left 03/27/2017   INVASIVE MAMMARY CARCINOMA Grade 1 UIG   BREAST LUMPECTOMY Left 04/13/2017   INVASIVE MAMMARY CARCINOMA/ DCIS, CLEAR MARGINS, NEG. LN   CESAREAN SECTION     COLONOSCOPY     COLONOSCOPY WITH PROPOFOL N/A 02/25/2020   Procedure: COLONOSCOPY WITH PROPOFOL;  Surgeon: [name], MD;  Location: [facility] ENDOSCOPY;  Service: Gastroenterology;  Laterality: N/A;    ESOPHAGOGASTRODUODENOSCOPY (EGD) WITH PROPOFOL N/A 02/05/2015   Procedure: ESOPHAGOGASTRODUODENOSCOPY (EGD) WITH PROPOFOL;  Surgeon: Josefine Class, MD;  Location: [facility] ENDOSCOPY;  Service: Endoscopy;  Laterality: N/A;   ESOPHAGOGASTRODUODENOSCOPY (EGD) WITH PROPOFOL N/A 02/25/2020   Procedure: ESOPHAGOGASTRODUODENOSCOPY (EGD) WITH PROPOFOL;  Surgeon: [name], MD;  Location: [facility] ENDOSCOPY;  Service: Gastroenterology;  Laterality: N/A;   PARTIAL MASTECTOMY WITH NEEDLE LOCALIZATION Left 04/13/2017   Procedure: PARTIAL MASTECTOMY WITH NEEDLE LOCALIZATION;  Surgeon: Leonie Green, MD;  Location: [facility] ORS;  Service: General;  Laterality: Left;   PATELLA FRACTURE SURGERY Left 1991   screws in place   SAVORY DILATION N/A 02/05/2015   Procedure: SAVORY DILATION;  Surgeon: Josefine Class, MD;  Location: [facility] ENDOSCOPY;  Service: Endoscopy;  Laterality: N/A;   SENTINEL NODE BIOPSY Left 04/13/2017   Procedure: SENTINEL NODE BIOPSY;  Surgeon: Leonie Green, MD;  Location: [facility] ORS;  Service: General;  Laterality: Left;  SINUS SURGERY WITH INSTATRAK      Current Outpatient Medications:    acetaminophen (TYLENOL) 500 MG tablet, Take 1,000 mg by mouth every 6 (six) hours as needed for moderate pain or headache., Disp: , Rfl:    amphetamine-dextroamphetamine (ADDERALL XR) 10 MG 24 hr capsule, Take 10 mg by mouth daily., Disp: , Rfl:    anastrozole (ARIMIDEX) 1 MG tablet, TAKE 1 TABLET BY MOUTH EVERY DAY, Disp: 90 tablet, Rfl: 0   ARIPiprazole (ABILIFY) 15 MG tablet, Take 15 mg by mouth daily., Disp: , Rfl:    atorvastatin (LIPITOR) 40 MG tablet, Take 40 mg by mouth daily. , Disp: , Rfl:    esomeprazole (NEXIUM) 40 MG capsule, Take 40 mg by mouth daily. , Disp: , Rfl:    Estradiol 10 MCG TABS vaginal tablet, Place vaginally as needed., Disp: , Rfl:    fluticasone (FLONASE) 50 MCG/ACT nasal spray, Place 2 sprays into both nostrils daily as needed for allergies., Disp: ,  Rfl:    levocetirizine (XYZAL) 5 MG tablet, Take 5 mg by mouth every evening. , Disp: , Rfl:    levothyroxine (SYNTHROID, LEVOTHROID) 150 MCG tablet, Take 150 mcg by mouth daily before breakfast., Disp: , Rfl:    LORazepam (ATIVAN) 0.5 MG tablet, Take 0.5 mg by mouth 3 (three) times daily., Disp: , Rfl:    metFORMIN (GLUCOPHAGE-XR) 500 MG 24 hr tablet, Take 1,000 mg by mouth 2 (two) times daily., Disp: , Rfl:    mupirocin ointment (BACTROBAN) 2 %, Apply 1 application topically 3 (three) times daily as needed., Disp: , Rfl:    oxybutynin (DITROPAN-XL) 5 MG 24 hr tablet, Take 1 tablet by mouth daily., Disp: , Rfl:    traZODone (DESYREL) 150 MG tablet, Take 150 mg by mouth at bedtime., Disp: , Rfl:    vitamin B-12 (CYANOCOBALAMIN) 1000 MCG tablet, Take 1,000 mcg by mouth daily., Disp: , Rfl:    vortioxetine HBr (TRINTELLIX) 20 MG TABS tablet, Take 20 mg by mouth daily., Disp: , Rfl:    Family History  Problem Relation Age of Onset   Breast cancer Maternal Aunt 70       deceased 69s   Breast cancer Cousin 35       daughter of mat aunt with breast cancer   Breast cancer Cousin 56       daughter of mat aunt with breast cancer   Deep vein thrombosis Mother    Heart disease Mother    Stroke Mother    Heart disease Father    Parkinson's disease Father    Heart disease Sister    Stroke Sister    Skin cancer Brother    Heart disease Brother    Alzheimer's disease Brother    Lung cancer Paternal Uncle        deceased 40s   Heart disease Brother    Alzheimer's disease Brother    Heart disease Brother    Alzheimer's disease Brother    Cancer Brother        unclear primary; currently 68s   Cirrhosis Sister    Alzheimer's disease Sister    Kidney cancer Son 96       nephrectomy @ [facility]     Social History   Tobacco Use   Smoking status: Never   Smokeless tobacco: Never  Vaping Use   Vaping Use: Never used  Substance Use Topics   Alcohol use: No   Drug use: No    Allergies as of  06/15/2021 - Review Complete 06/15/2021  Allergen Reaction Noted   Amoxicillin Hives, Itching, and Other (See Comments) 12/19/2011   Erythromycin Hives and Itching 12/19/2011   Sulfa antibiotics Hives and Itching 12/19/2011   Contrast media [iodinated contrast media] Rash 02/04/2015   Morphine Nausea And Vomiting and Other (See Comments) 12/19/2011    Review of Systems:    All systems reviewed and negative except where noted in HPI.   Physical Exam:  BP (!) 145/88 (BP Location: Left Arm, Patient Position: Sitting, Cuff Size: Normal)    Pulse 84    Temp 98.5 F (36.9 C) (Oral)    Ht 5' 7.5" (1.715 m)    Wt 182 lb 2 oz (82.6 kg)    BMI 28.10 kg/m  No LMP recorded. Patient has had a hysterectomy.  General:   Alert,  Well-developed, well-nourished, pleasant and cooperative in NAD Head:  Normocephalic and atraumatic. Eyes:  Sclera clear, no icterus.   Conjunctiva pink. Ears:  Normal auditory acuity. Nose:  No deformity, discharge, or lesions. Mouth:  No deformity or lesions,oropharynx pink & moist. Neck:  Supple; no masses or thyromegaly. Lungs:  Respirations even and unlabored.  Clear throughout to auscultation.   No wheezes, crackles, or rhonchi. No acute distress. Heart:  Regular rate and rhythm; no murmurs, clicks, rubs, or gallops. Abdomen:  Normal bowel sounds. Soft, obese, non-tender and non-distended without masses, hepatosplenomegaly or hernias noted.  No guarding or rebound tenderness.   Rectal: Not performed Msk:  Symmetrical without gross deformities. Good, equal movement & strength bilaterally. Pulses:  Normal pulses noted. Extremities:  No clubbing or edema.  No cyanosis. Neurologic:  Alert and oriented x3;  grossly normal neurologically. Skin:  Intact without significant lesions or rashes. No jaundice. Lymph Nodes:  No significant cervical adenopathy. Psych:  Alert and cooperative. Normal mood and affect.  Imaging Studies: Reviewed  Assessment and Plan:   IKEISHA BLUMBERG is a 71 y.o. Caucasian female with stage I left breast cancer maintained on Arimidex, obesity, hypothyroidism, diabetes, chronic GERD is seen in for follow-up of fatty liver  Elevated LFTs: Secondary to fatty liver disease, biopsy-proven, currently normalized Secondary liver disease work-up is negative Recheck LFTs in 6 months Congratulated her on good control of diabetes Continue metformin and strict diabetic diet  Chronic GERD Continue Nexium 40 mg daily EGD did not reveal any evidence of Barrett's.  Given history of medium size hiatal hernia, recommend long-term PPI.  Also, discussed with patient regarding surgical repair of hiatal hernia and she is interested.  Will refer her to Dr. Dahlia Byes for surgical evaluation  Tubular adenomas of the colon Recommend surveillance colonoscopy in 01/2027   Follow up in 6 months   Cephas Darby, MD

## 2021-07-04 ENCOUNTER — Ambulatory Visit (INDEPENDENT_AMBULATORY_CARE_PROVIDER_SITE_OTHER): Payer: Medicare Other | Admitting: Surgery

## 2021-07-04 ENCOUNTER — Other Ambulatory Visit: Payer: Self-pay

## 2021-07-04 ENCOUNTER — Encounter: Payer: Self-pay | Admitting: Surgery

## 2021-07-04 VITALS — BP 118/80 | HR 94 | Temp 98.2°F | Ht 67.5 in | Wt 181.0 lb

## 2021-07-04 DIAGNOSIS — K449 Diaphragmatic hernia without obstruction or gangrene: Secondary | ICD-10-CM

## 2021-07-04 NOTE — Progress Notes (Signed)
Patient ID: April Leblanc, female   DOB: 03/21/1951, 71 y.o.   MRN: [id number]  HPI [name] is a 71 y.o. female seen in consultation at the request of Dr. Marius Ditch She does have a history of reflux and diabetes as well as hypothyroidism, She takes omepeprazole daily.  Reflux symptoms worsening when laying supine.  She requires PPI daily and still reflux is not controlled.  Denies dysphagia  EGD performed by Dr. Marius Ditch a year and 2 months ago revealed evidence of medium size hiatal hernia.  Most recent ultrasound personally reviewed showing evidence of a gallbladder polyp and fatty liver. CBC is normal CMP is normal except glucose of 202 and alkaline phosphatase of 145.  Please note that this trended down SHe is also a breast cancer survivor and had a partial mastectomy with radiation therapy for years ago by Dr. Tamala Julian Mammogram personally reviewed showing no suspicious lesions, 2022. She also had a chest x-ray.  Have ago showing evidence of moderate size hiatal hernia Prior abdominal surgical history significant for abdominal hysterectomy and appendectomy in the remote past She did have a stress test 3 years ago her cardiologist Dr. Flonnie Overman. Preserved ejection fraction with mildly abnormal results, normal wall motion. She is very independent she is able to perform more than 4 METS of activity without any shortness of breath or chest pain.  Denies any MIs or strokes.    HPI  Past Medical History:  Diagnosis Date   Anemia    Ankle sprain 03/2017   Arthritis    Breast cancer ([place]) 2018   Breast cancer ([place]) 03/2017   Depression    Diabetes mellitus without complication ([place])    Fracture of distal end of radius 03/2017   left   Genetic testing 05/04/2017   Multi-Cancer panel (83 genes) @ [facility] - No pathogenic mutations detected   GERD (gastroesophageal reflux disease)    Headache    Hyperlipidemia    Hypothyroidism    Personal history of radiation therapy    Pre-diabetes  03/2017   follows healthy diabetic diet   Sleep apnea    has not used cpap in a long time   Vitamin B 12 deficiency     Past Surgical History:  Procedure Laterality Date   [place]   when she had c-section   BREAST BIOPSY Left 03/27/2017   INVASIVE MAMMARY CARCINOMA Grade 1 UIG   BREAST LUMPECTOMY Left 04/13/2017   INVASIVE MAMMARY CARCINOMA/ DCIS, CLEAR MARGINS, NEG. LN   CESAREAN SECTION     COLONOSCOPY     COLONOSCOPY WITH PROPOFOL N/A 02/25/2020   Procedure: COLONOSCOPY WITH PROPOFOL;  Surgeon: [name], MD;  Location[facility] ENDOSCOPY;  Service: Gastroenterology;  Laterality: N/A;   ESOPHAGOGASTRODUODENOSCOPY (EGD) WITH PROPOFOL N/A 02/05/2015   Procedure: ESOPHAGOGASTRODUODENOSCOPY (EGD) WITH PROPOFOL;  Surgeon: Josefine Class, MD;  Location: [facility] ENDOSCOPY;  Service: Endoscopy;  Laterality: N/A;   ESOPHAGOGASTRODUODENOSCOPY (EGD) WITH PROPOFOL N/A 02/25/2020   Procedure: ESOPHAGOGASTRODUODENOSCOPY (EGD) WITH PROPOFOL;  Surgeon: [name], MD;  Location: [facility] ENDOSCOPY;  Service: Gastroenterology;  Laterality: N/A;   PARTIAL MASTECTOMY WITH NEEDLE LOCALIZATION Left 04/13/2017   Procedure: PARTIAL MASTECTOMY WITH NEEDLE LOCALIZATION;  Surgeon: Leonie Green, MD;  Location: [facility] ORS;  Service: General;  Laterality: Left;   PATELLA FRACTURE SURGERY Left 1991   screws in place   SAVORY DILATION N/A 02/05/2015   Procedure: SAVORY DILATION;  Surgeon: Josefine Class, MD;  Location: [facility] ENDOSCOPY;  Service: Endoscopy;  Laterality: N/A;   SENTINEL NODE BIOPSY Left 04/13/2017   Procedure: SENTINEL NODE BIOPSY;  Surgeon: Leonie Green, MD;  Location: [facility] ORS;  Service: General;  Laterality: Left;   SINUS SURGERY WITH INSTATRAK      Family History  Problem Relation Age of Onset   Breast cancer Maternal Aunt 74       deceased 54s   Breast cancer Cousin 27       daughter of mat aunt with breast cancer    Breast cancer Cousin 60       daughter of mat aunt with breast cancer   Deep vein thrombosis Mother    Heart disease Mother    Stroke Mother    Heart disease Father    Parkinson's disease Father    Heart disease Sister    Stroke Sister    Skin cancer Brother    Heart disease Brother    Alzheimer's disease Brother    Lung cancer Paternal Uncle        deceased 66s   Heart disease Brother    Alzheimer's disease Brother    Heart disease Brother    Alzheimer's disease Brother    Cancer Brother        unclear primary; currently 11s   Cirrhosis Sister    Alzheimer's disease Sister    Kidney cancer Son 48       nephrectomy @ [facility]    Social History Social History   Tobacco Use   Smoking status: Never   Smokeless tobacco: Never  Vaping Use   Vaping Use: Never used  Substance Use Topics   Alcohol use: No   Drug use: No    Allergies  Allergen Reactions   Amoxicillin Hives, Itching and Other (See Comments)    Has patient had a PCN reaction causing immediate rash, facial/tongue/throat swelling, SOB or lightheadedness with hypotension: No Has patient had a PCN reaction causing severe rash involving mucus membranes or skin necrosis: No Has patient had a PCN reaction that required hospitalization: No Has patient had a PCN reaction occurring within the last 10 years: No If all of the above answers are "NO", then may proceed with Cephalosporin use.    Erythromycin Hives and Itching   Sulfa Antibiotics Hives and Itching   Contrast Media [Iodinated Contrast Media] Rash    Red rash and itching. Topical iodine not a problem.   Morphine Nausea And Vomiting and Other (See Comments)    Pretty severe vomiting. Can take hydrocodone and oxycodone      Current Outpatient Medications  Medication Sig Dispense Refill   acetaminophen (TYLENOL) 500 MG tablet Take 1,000 mg by mouth every 6 (six) hours as needed for moderate pain or headache.     amphetamine-dextroamphetamine (ADDERALL XR)  10 MG 24 hr capsule Take 10 mg by mouth daily.     anastrozole (ARIMIDEX) 1 MG tablet TAKE 1 TABLET BY MOUTH EVERY DAY 90 tablet 0   ARIPiprazole (ABILIFY) 15 MG tablet Take 15 mg by mouth daily.     atorvastatin (LIPITOR) 40 MG tablet Take 40 mg by mouth daily.      esomeprazole (NEXIUM) 40 MG capsule Take 40 mg by mouth daily.      Estradiol 10 MCG TABS vaginal tablet Place vaginally as needed.     fluticasone (FLONASE) 50 MCG/ACT nasal spray Place 2 sprays into both nostrils daily as needed for allergies.     levocetirizine (XYZAL) 5 MG  tablet Take 5 mg by mouth every evening.      levothyroxine (SYNTHROID, LEVOTHROID) 150 MCG tablet Take 150 mcg by mouth daily before breakfast.     LORazepam (ATIVAN) 0.5 MG tablet Take 0.5 mg by mouth 3 (three) times daily.     metFORMIN (GLUCOPHAGE-XR) 500 MG 24 hr tablet Take 1,000 mg by mouth 2 (two) times daily.     mupirocin ointment (BACTROBAN) 2 % Apply 1 application topically 3 (three) times daily as needed.     oxybutynin (DITROPAN-XL) 5 MG 24 hr tablet Take 1 tablet by mouth daily.     traZODone (DESYREL) 150 MG tablet Take 150 mg by mouth at bedtime.     vitamin B-12 (CYANOCOBALAMIN) 1000 MCG tablet Take 1,000 mcg by mouth daily.     vortioxetine HBr (TRINTELLIX) 20 MG TABS tablet Take 20 mg by mouth daily.     No current facility-administered medications for this visit.     Review of Systems Full ROS  was asked and was negative except for the information on the HPI  Physical Exam There were no vitals taken for this visit. CONSTITUTIONAL: NAD. EYES: Pupils are equal, round, and reactive to light, Sclera are non-icteric. EARS, NOSE, MOUTH AND THROAT: The oropharynx is clear. The oral mucosa is pink and moist. Hearing is intact to voice. LYMPH NODES:  Lymph nodes in the neck are normal. RESPIRATORY:  Lungs are clear. There is normal respiratory effort, with equal breath sounds bilaterally, and without pathologic use of accessory  muscles. CARDIOVASCULAR: Heart is regular without murmurs, gallops, or rubs. GI: The abdomen is  soft, nontender, and nondistended. There are no palpable masses. There is no hepatosplenomegaly. There are normal bowel sounds. Lower midline laparotomy scar GU: Rectal deferred.   MUSCULOSKELETAL: Normal muscle strength and tone. No cyanosis or edema.   SKIN: Turgor is good and there are no pathologic skin lesions or ulcers. NEUROLOGIC: Motor and sensation is grossly normal. Cranial nerves are grossly intact. PSYCH:  Oriented to person, place and time. Affect is normal.  Data Reviewed  I have personally reviewed the patient's imaging, laboratory findings and medical records.    Assessment/Plan Symptomatic hiatal hernia. I Discussed with patient detail about her disease process.  We will start work-up in the form of a barium swallow and CT scan of the abdomen pelvis to delineate intra-abdominal anatomy as well as of esophageal function. I will see her back after work-up is completed We will also touch base with PCP regarding preoperative optimization and add EKG We also d/w her about options for Hiatal hernia repair to include robotic H/H repair and fundoplication. RTc after work-up is completed Please note that I spent greater than 65 minutes in this encounter including coordination of her care, personally reviewing imaging studies, placing orders, counseling the patient and performing appropriate documentation  Caroleen Hamman, MD FACS General Surgeon 07/04/2021, 2:25 PM

## 2021-07-04 NOTE — Patient Instructions (Addendum)
We are going to get you scheduled for a CT of the abdomen and pelvis with oral contrast to better assess the hiatal hernia.  We will send for clearance from your medical doctor.   We are going to schedule you for a Barium Swallow study to see how you are swallowing.  We will call you about scheduling these studies.   We will have you follow up here after we get the results of these tests.      Laparoscopic Nissen Fundoplication Laparoscopic Nissen fundoplication is surgery to relieve heartburn and other problems caused by gastric fluids flowing up into your esophagus. The esophagus is the tube that carries food and liquid from your throat to your stomach. Normally, the muscle that sits between your stomach and your esophagus (lower esophageal sphincter or LES) keeps stomach fluids in your stomach. In some people, the LES does not work properly, and stomach fluids flow up into the esophagus. This can happen when part of the stomach bulges through the LES (hiatal hernia). The backward flow of stomach fluids can cause a type of severe and long-standing heartburn that is called gastroesophageal reflux disease (GERD). You may need this surgery if other treatments for GERD have not helped. In a laparoscopic Nissen fundoplication, the upper part of your stomach is wrapped around the lower part of your esophagus to strengthen the LES and prevent reflux. If you have a hiatal hernia, it will also be repaired with this surgery. The procedure is done through several small incisions in your abdomen. It is performed using a thin, telescopic instrument (laparoscope) and other instruments that can pass through the scope or through other small incisions. Tell a health care provider about: Any allergies you have. All medicines you are taking, including vitamins, herbs, eye drops, creams, and over-the-counter medicines. Any problems you or family members have had with anesthetic medicines. Any blood disorders you  have. Any surgeries you have had. Any medical conditions you have. What are the risks? Generally, this is a safe procedure. However, problems may occur, including: Difficulty swallowing (dysphagia). Bloating. Nausea or vomiting. Damage to the lung, causing a collapsed lung. Infection or bleeding. What happens before the procedure? Ask your health care provider about: Changing or stopping your regular medicines. This is especially important if you are taking diabetes medicines or blood thinners. Taking medicines such as aspirin and ibuprofen. These medicines can thin your blood. Do not take these medicines before your procedure if your health care provider asks you not to. Follow your health care providers instructions about eating or drinking restrictions. Plan to have someone take you home after the procedure. What happens during the procedure? An IV tube will be inserted into one of your veins. It will be used to give you fluids and medicines during the procedure. You will be given a medicine that makes you fall asleep (general anesthetic). Your abdomen will be cleaned with a germ-killing solution (antiseptic). The surgeon will make a small incision in your abdomen and insert a tube through the incision. Your abdomen will be filled with a gas. This helps the surgeon to see your organs more easily and it makes more space to work. The surgeon will insert the laparoscope through the incision. The scope has a camera that will send pictures to a monitor in the operating room. The surgeon will make several other small incisions in your abdomen to insert the other instruments that are needed during the procedure. Another instrument (dilator) will be passed through your  mouth and down your esophagus into the upper part of your stomach. The dilator will prevent your LES from being closed too tightly during surgery. The surgeon will pass the top portion of your stomach behind the lower part of your  esophagus and wrap it all the way around. This will be stitched into place. If you have a hiatal hernia, it will be repaired during this procedure. All instruments will be removed, and your incisions will be closed under your skin with stitches (sutures). Skin adhesive strips may also be used. A bandage (dressing) will be placed on your skin over the incisions. The procedure may vary among health care providers and hospitals. What happens after the procedure? You will be moved to a recovery area. Your blood pressure, heart rate, breathing rate, and blood oxygen level will be monitored often until the medicines you were given have worn off. You will be given pain medicine as needed. Your IV tube will be kept in until you are able to drink fluids. This information is not intended to replace advice given to you by your health care provider. Make sure you discuss any questions you have with your health care provider. Document Released: 05/08/2014 Document Revised: 09/23/2015 Document Reviewed: 12/17/2013 Elsevier Interactive Patient Education  2017 [employer]   Laparoscopic Nissen Fundoplication, Care After Refer to this sheet in the next few weeks. These instructions provide you with information about caring for yourself after your procedure. Your health care provider may also give you more specific instructions. Your treatment has been planned according to current medical practices, but problems sometimes occur. Call your health care provider if you have any problems or questions after your procedure. What can I expect after the procedure? After the procedure, it is common to have: Difficulty swallowing (dysphagia). Excess gas (bloating). Follow these instructions at home: Medicines  Take medicines only as directed by your health care provider. Do not drive or operate heavy machinery while taking pain medicine. Incision care  There are many different ways to close and cover an incision,  including stitches (sutures), skin glue, and adhesive strips. Follow your health care provider's instructions about: Incision care. Bandage (dressing) changes and removal. Incision closure removal. Check your incision areas every day for signs of infection. Watch for: Redness, swelling, or pain. Fluid, blood, or pus. Do not take baths, swim, or use a hot tub until your health care provider approves. Take showers as directed by your health care provider. Eating and drinking  Follow your health care provider's instructions about eating. You may need to follow a liquid-only diet for 2 weeks, followed by a diet of soft foods for 2 weeks. You should return to your usual diet gradually. Drink enough fluid to keep your urine clear or pale yellow. Activity  Return to your normal activities as directed by your health care provider. Ask your health care provider what activities are safe for you. Avoid strenuous exercise. Do not lift anything that is heavier than 10 lb (4.5 kg). Ask your health care provider when you can: Return to sexual activity. Drive. Go back to work. Contact a health care provider if: You have a fever. Your pain gets worse or is not helped by medicine. You have frequent nausea or vomiting. You have continued abdominal bloating. You have an ongoing (persistent) cough. You have redness, swelling, or pain in any incision areas. You have fluid, blood, or pus coming from any incisions. Get help right away if: You have trouble breathing. You are  unable to swallow. You have persistent vomiting. You have blood in your vomit. You have severe abdominal pain. This information is not intended to replace advice given to you by your health care provider. Make sure you discuss any questions you have with your health care provider. Document Released: 12/09/2003 Document Revised: 09/23/2015 Document Reviewed: 12/17/2013 Elsevier Interactive Patient Education  2017 [place].   Diet After Nissen Fundoplication Surgery This diet information is for patients who have recently had Nissen fundoplication surgery to correct reflux disease or to repair various types of hernias, such as hiatal hernia and intrathoracic stomach. This diet may also be used for other gastrointestinal surgeries, such as Heller myotomy and repair of achalasia. The diet will help control diarrhea, excess gas and swallowing problems, which may occur after this type of surgery. Keeping Your Stomach from Stretching Eat small, frequent meals (six to eight per day). This will help you consume the majority of the nutrients you need without causing your stomach to feel full or distended.  Drinking large amounts of fluids with meals can stretch your stomach. You may drink fluids between meals as often as you like, but limit fluids to 1/2 cup (4 fluid ounces) with meals and one cup (8 fluid ounces) with snacks.  Sit upright while eating and stay upright for 30 minutes after each meal. Gravity can help food move through your digestive tract. Do not lie down after eating. Sit upright for 2 hours after your last meal or snack of the day.  Eat very slowly. Take your time when eating.  Take small bites and chew your food well to help aid in swallowing and digestion.  Avoid crusty breads and sticky, gummy foods, such as bananas, fresh doughy breads, rolls and doughnuts. These types of foods become sticky and difficult to swallow.  Toasted breads tend to be better tolerated.  Lastly, if you eat sweets, consume them at the end of your meal to avoid a group of symptoms referred to as dumping syndrome. This describes the rapid emptying of foods from the stomach to the small intestine. Sweetened beverages, candy and desserts move more rapidly and dump quickly into the intestines. This can cause symptoms of nausea, weakness, cold sweats, cramps, diarrhea and dizzy spells.  Avoiding Gas Avoid drinking through a straw.  Do not chew gum or tobacco. These actions cause you to swallow air, which produces excess gas in your stomach. Chew with your mouth closed.  Avoid any foods that cause stomach gas and distention. These foods include corn, dried beans, peas, lentils, onions, broccoli, cauliflower and any food from the cabbage family.  Avoid carbonated drinks, alcohol, citrus and tomato products.  When will I be able to eat a soft diet? After Nissen fundoplication surgery, your diet will be advanced slowly by your surgeon. Generally, you will be on a clear liquid diet for the first few meals. Then you will advance to the full liquid diet for a meal or two and eventually to a Nissen soft diet. Please be aware that each patient's tolerance to food is different. Your doctor will advance your diet depending on how well you progress after surgery. Clear Liquid Diet  The first diet after surgery is the clear liquid diet. It includes the following liquids: Apple juice  Cranberry juice  Grape juice  Chicken broth  Beef broth  Flavored gelatin (Jell-O)  Decaf tea and coffee  Caffeinated beverages are permitted based on tolerance  Popsicles  [place] ice Carbonated drinks (sodas)  are not allowed for the first six to eight weeks after surgery. After this time you can try them again in small amounts.  Full Liquid Diet The full liquid diet contains anything on the clear liquid diet, plus: Milk, soy, rice and almond (no chocolate)  Cream of wheat, cream of rice, grits  Strained creamed soups (no tomato or broccoli)  Vanilla and strawberry-flavored ice cream  Sherbet  Blended, custard styled or whipped yogurt (plain or vanilla only)  Vanilla and butterscotch pudding (no chocolate or coconut)  Nutritional drinks including Ensure, Boost, Carnation Instant Breakfast (no chocolate-flavored) Note: Dairy products, such as milk, ice cream and pudding, may cause diarrhea in some people just after surgery. You may need to  avoid milk products. If so, substitute them with lactose-free beverages, such as soy, rice, Lactaid or almond milks.  Nissen Soft Diet Food Category Foods to Choose Foods to Avoid  Beverages Milk, such as, whole, 2%, 1%, non-fat, or skim, soy, rice, almond  Caffeinated and decaf tea and coffee  Powdered drink mixes (in moderation)  Non-citrus juices (apple, grape, cranberry or blends of these)  Fruit nectars  Nutritional drinks including Boost, Ensure, Carnation Instant Breakfast Chocolate milk, cocoa or other chocolate-flavored drinks  Carbonated drinks  Alcohol  Citrus juices like orange, grapefruit, lemon and lime  Breads Pancakes, [place] toast and waffles  Crackers (saltine, butter, soda, graham, Goldfish and Cheese Nips)  Toasted bread Untoasted bread, bagels, [place] and hard rolls, English muffins  Crackers with nuts, seeds, fresh or dried fruit, coconut, or highly seasoned, such as garlic or onion-flavored  Sweet rolls, coffee cake or doughnuts  Cereals Well cooked cereals, such as oatmeal (plain or flavored)  Cold cereal (Cornflakes, Rice Krispies, Cheerios, Special K plain, Rice Chex and puffed rice) Very coarse cereal, such as bran, shredded wheat  Any cereal with fresh or dried fruit, coconut, seeds or nuts  Desserts Eat in moderation and do not eat desserts or sweets by themselves. Plain cakes, cookies and cream-filled pies  Vanilla and butterscotch pudding or custard  Ice cream, ice milk, frozen yogurt and sherbet  Gelatin made from allowed foods  Fruit ices and popsicles Desserts containing chocolate, coconut, nuts, seeds, fresh or dried fruit, peppermint or spearmint  Eggs  Poached, hard boiled or scrambled Fried eggs and highly seasoned eggs (deviled eggs)  Fats Eat in moderation. Butter and margarine  Mayonnaise and vegetable oils  Mildly seasoned cream sauces and gravies  Plain cream cheese  Sour cream Highly seasoned salad dressings, cream sauces and  gravies  Bacon, bacon fat, ham fat, lard and salt pork  Fried foods  Nuts  Fruits Fruit juice  Any canned or cooked fruit except those listed in the AVOID column ALL fresh fruits, such as citrus, bananas and pineapple  Canned pineapple  Dried fruits, such as raisins, berries  Fruits with seeds, such as berries, kiwi and figs  Meat, Fish, Poultry, and [employer] may be ground, minced or chopped to ease swallowing and digestion  Tender, well cooked and moist cuts of beef, chicken, [place] and pork  Veal and lamb  Flaky, cooked fish  Canned tuna  Cottage and ricotta cheeses  Mild cheese, such as American, brick, mozzarella and baby Swiss  Creamy peanut butter  Plain custard or blended fruit yogurt  Moist casseroles, such as macaroni & cheese, tuna noodle  Grilled or toasted cheese sandwich Tough meats with a lot of gristle  Fried, highly seasoned, smoked and fatty meat, fish  or poultry, such as frankfurters, luncheon meats, sausage, bacon, spare ribs, beef brisket, sardines, anchovies, duck and goose  Chili and other entrees made with pepper or chili pepper  Shellfish  Strongly flavored cheeses, such as sharp cheese, extra sharp cheddar, cheese containing peppers or other seasonings  Crunchy peanut butter  Any yogurt with nuts, seeds, coconut, strawberries or raspberries  Potatoes and Starches Peeled, mashed or boiled white or sweet potatoes  Oven-baked potatoes without skin  Well cooked white rice, enriched noodles, barley, spaghetti, macaroni and other pastas Fried potatoes, potato skins and potato chips  Hard and soft taco shells  Fried, brown or wild rice  Soups Mildly flavored meat stocks  Cream soups made from allowed foods Highly seasoned soups and tomato based soups, cream soups made with gas producing vegetables, such as broccoli, cauliflower, onion, etc.  Sweets and Snacks Use in moderation and do not eat large amounts of sweets by themselves. Syrup, honey, jelly and  seedless jam  Plain hard candies and plain candies made with allowed ingredients  Molasses  Marshmallows  Other candy made from allowed ingredients  Thin pretzels Jam, marmalade and preserves  Chocolate in any form  Any candy containing nuts, coconut, seeds, peppermint, spearmint or dried or fresh fruit  Popcorn, potato chips, tortilla chips  Soft or hard thick pretzels, such as sourdough  Vegetables Well cooked soft vegetables without seeds or skins, such as asparagus tips, beets, carrots, green and wax beans, chopped spinach, tender canned baby peas, squash and pumpkin Raw vegetables, tomatoes, tomato juice, tomato sauce and V-8 juice  Gas producing vegetables, such as broccoli, Brussel sprouts, cabbage, cauliflower, onions, corn, cucumber, green peppers, rutabagas, turnips, radishes and sauerkraut  Dried beans, peas and lentils  Miscellaneous Salt and spices in moderation  Mustard and vinegar in moderation Fried or highly seasoned foods  Coconut and seeds  Pickles and olives  Chili sauces, ketchup, barbecue sauce, horseradish, black pepper, chili powder and onion and garlic seasonings  Any other strongly flavored seasoning, condiment, spice or herb not tolerated  Any food not tolerated

## 2021-07-05 ENCOUNTER — Telehealth: Payer: Self-pay

## 2021-07-05 NOTE — Progress Notes (Signed)
Request for Medical Clearance has been faxed to Dr Marya Landry at [facility]. ?

## 2021-07-05 NOTE — Telephone Encounter (Signed)
Message left for the patient to call to get her imaging information. ? ?The patient is scheduled for a CT of the abdomen with oral contrast only and a Barium swallow at [facility] on 07/20/21. She will need to arrive there by 10:00 am and enter in through the [facility]. She will need to have nothing to eat or drink for 4 hours prior. She will need to pick up a prep kit for this study and may do so at [facility] or at [facility] anytime.  ?

## 2021-07-07 NOTE — Telephone Encounter (Signed)
Spoke with the patient and reviewed her imaging information.  ?

## 2021-07-15 NOTE — Progress Notes (Signed)
Medical Clearance received from Dr Marya Landry. The patient is cleared at [facility].  ?

## 2021-07-20 ENCOUNTER — Telehealth: Payer: Self-pay

## 2021-07-20 ENCOUNTER — Other Ambulatory Visit: Payer: Self-pay

## 2021-07-20 ENCOUNTER — Ambulatory Visit
Admission: RE | Admit: 2021-07-20 | Discharge: 2021-07-20 | Disposition: A | Discharge status: Home / Self Care | Payer: Medicare Other | Source: Ambulatory Visit | Attending: Surgery | Admitting: Surgery

## 2021-07-20 DIAGNOSIS — K449 Diaphragmatic hernia without obstruction or gangrene: Secondary | ICD-10-CM

## 2021-07-20 NOTE — Telephone Encounter (Signed)
Patient notified of results and to keep appointment with Dr.Pabon. ?

## 2021-08-01 ENCOUNTER — Encounter: Payer: Self-pay | Admitting: Surgery

## 2021-08-01 ENCOUNTER — Ambulatory Visit (INDEPENDENT_AMBULATORY_CARE_PROVIDER_SITE_OTHER): Payer: Medicare Other | Admitting: Surgery

## 2021-08-01 ENCOUNTER — Telehealth: Payer: Self-pay | Admitting: Surgery

## 2021-08-01 VITALS — BP 133/90 | HR 93 | Temp 98.3°F | Ht 67.5 in | Wt 181.0 lb

## 2021-08-01 DIAGNOSIS — K449 Diaphragmatic hernia without obstruction or gangrene: Secondary | ICD-10-CM

## 2021-08-01 NOTE — H&P (View-Only) (Signed)
Outpatient Surgical Follow Up ? ?08/01/2021 ? ?April Leblanc is an 71 y.o. female.  ? ?Chief Complaint  ?Patient presents with  ? Follow-up  ? ? 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  Cardiovascular performance.  She did have a CT and barium swallow that have personally reviewed showing evidence of a moderate size paraesophageal hernia type III about a third of the stomach is within the mediastinum.  She continues to have symptoms of reflux.  EGD showing no strictures.  No Barrett's. ?Is here for surgical discussion. I have shared the images w her ?She did have a prior history of abdominal hysterectomy does have a lower midline laparotomy scar ? ?Past Medical History:  ?Diagnosis Date  ? Anemia   ? Ankle sprain 03/2017  ? Arthritis   ? Breast cancer ([place]) 2018  ? Breast cancer ([place]) 03/2017  ? Depression   ? Diabetes mellitus without complication ([place])   ? Fracture of distal end of radius 03/2017  ? left  ? Genetic testing 05/04/2017  ? Multi-Cancer panel (83 genes) @ [facility] - No pathogenic mutations detected  ? GERD (gastroesophageal reflux disease)   ? Headache   ? Hyperlipidemia   ? Hypothyroidism   ? Personal history of radiation therapy   ? Pre-diabetes 03/2017  ? follows healthy diabetic diet  ? Sleep apnea   ? has not used cpap in a long time  ? Vitamin B 12 deficiency   ? ? ?Past Surgical History:  ?Procedure Laterality Date  ? ABDOMINAL HYSTERECTOMY  1983  ? APPENDECTOMY  1977  ? when she had c-section  ? BREAST BIOPSY Left 03/27/2017  ? INVASIVE MAMMARY CARCINOMA Grade 1 UIG  ? BREAST LUMPECTOMY Left 04/13/2017  ? 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 CESAREAN SECTION    ? COLONOSCOPY    ? COLONOSCOPY WITH PROPOFOL N/A 02/25/2020  ? Procedure: COLONOSCOPY WITH PROPOFOL;  Surgeon: [name], MD;  Location: [facility] ENDOSCOPY;  Service: Gastroenterology;  Laterality: N/A;  ?  ESOPHAGOGASTRODUODENOSCOPY (EGD) WITH PROPOFOL N/A 02/05/2015  ? Procedure: ESOPHAGOGASTRODUODENOSCOPY (EGD) WITH PROPOFOL;  Surgeon: Josefine Class, MD;  Location: [facility] ENDOSCOPY;  Service: Endoscopy;  Laterality: N/A;  ? ESOPHAGOGASTRODUODENOSCOPY (EGD) WITH PROPOFOL N/A 02/25/2020  ? Procedure: ESOPHAGOGASTRODUODENOSCOPY (EGD) WITH PROPOFOL;  Surgeon: [name], MD;  Location: [facility] ENDOSCOPY;  Service: Gastroenterology;  Laterality: N/A;  ? PARTIAL MASTECTOMY WITH NEEDLE LOCALIZATION Left 04/13/2017  ? Procedure: PARTIAL MASTECTOMY WITH NEEDLE LOCALIZATION;  Surgeon: Leonie Green, MD;  Location: [facility] ORS;  Service: General;  Laterality: Left;  ? PATELLA FRACTURE SURGERY Left 1991  ? screws in place  ? SAVORY DILATION N/A 02/05/2015  ? Procedure: SAVORY DILATION;  Surgeon: Josefine Class, MD;  Location: [facility] ENDOSCOPY;  Service: Endoscopy;  Laterality: N/A;  ? SENTINEL NODE BIOPSY Left 04/13/2017  ? Procedure: SENTINEL NODE BIOPSY;  Surgeon: Leonie Green, MD;  Location: [facility] ORS;  Service: General;  Laterality: Left;  ? SINUS SURGERY WITH INSTATRAK    ? ? ?Family History  ?Problem Relation Age of Onset  ? Breast cancer Maternal Aunt 70  ?     deceased 70s  ? Breast cancer Cousin 15  ?     daughter of mat aunt with breast cancer  ? Breast cancer Cousin 41  ?     daughter of mat aunt with breast cancer  ? Deep vein thrombosis Mother   ?  Heart disease Mother   ? Stroke Mother   ? Heart disease Father   ? Parkinson's disease Father   ? Heart disease Sister   ? Stroke Sister   ? Skin cancer Brother   ? Heart disease Brother   ? Alzheimer's disease Brother   ? Lung cancer Paternal Uncle   ?     deceased 19s  ? Heart disease Brother   ? Alzheimer's disease Brother   ? Heart disease Brother   ? Alzheimer's disease Brother   ? Cancer Brother   ?     unclear primary; currently 81s  ? Cirrhosis Sister   ? Alzheimer's disease Sister   ? Kidney cancer Son 100  ?     nephrectomy @ [facility]   ? ? ?Social History:  reports that she has never smoked. She has been exposed to tobacco smoke. She has never used smokeless tobacco. She reports that she does not drink alcohol and does not use drugs. ? ?Allergies:  ?Allergies  ?Allergen Reactions  ? Amoxicillin Hives, Itching and Other (See Comments)  ?  Has patient had a PCN reaction causing immediate rash, facial/tongue/throat swelling, SOB or lightheadedness with hypotension: No ?Has patient had a PCN reaction causing severe rash involving mucus membranes or skin necrosis: No ?Has patient had a PCN reaction that required hospitalization: No ?Has patient had a PCN reaction occurring within the last 10 years: No ?If all of the above answers are "NO", then may proceed with Cephalosporin use. ?  ? Erythromycin Hives and Itching  ? Sulfa Antibiotics Hives and Itching  ? Contrast Media [Iodinated Contrast Media] Rash  ?  Red rash and itching. Topical iodine not a problem.  ? Morphine Nausea And Vomiting and Other (See Comments)  ?  Pretty severe vomiting. Can take hydrocodone and oxycodone ? ?  ? ? ?Medications reviewed. ? ? ? ?ROS ?Full ROS performed and is otherwise negative other than what is stated in HPI ? ? ?BP 133/90   Pulse 93   Temp 98.3 ?F (36.8 ?C)   Ht 5' 7.5" (1.715 m)   Wt 181 lb (82.1 kg)   SpO2 98%   BMI 27.93 kg/m?  ? ?Physical Exam ?CONSTITUTIONAL: NAD. ?EYES: Pupils are equal, round, and reactive to light, Sclera are non-icteric. ?EARS, NOSE, MOUTH AND THROAT: The oropharynx is clear. The oral mucosa is pink and moist. Hearing is intact to voice. ?LYMPH NODES:  Lymph nodes in the neck are normal. ?RESPIRATORY:  Lungs are clear. There is normal respiratory effort, with equal breath sounds bilaterally, and without pathologic use of accessory muscles. ?CARDIOVASCULAR: Heart is regular without murmurs, gallops, or rubs. ?GI: The abdomen is  soft, nontender, and nondistended. There are no palpable masses. There is no hepatosplenomegaly. There are  normal bowel sounds. Lower midline laparotomy scar ?GU: Rectal deferred.   ?MUSCULOSKELETAL: Normal muscle strength and tone. No cyanosis or edema.   ?SKIN: Turgor is good and there are no pathologic skin lesions or ulcers. ?NEUROLOGIC: Motor and sensation is grossly normal. Cranial nerves are grossly intact. ?PSYCH:  Oriented to person, place and time. Affect  ? ?Assessment/Plan: ?71 year old female with significant type III paraesophageal hernia.  I had an extensive discussion with the patient and I do recommend repair.  I do think that she will be a good candidate for robotic approach.  Procedure discussed with the patient in detail.  Risk, benefits and possible complications including but not limited to: Bleeding, infection, dysphagia, esophageal bowel injuries, pneumothoraxes.  I also discussed with her postoperative care and diet.  She wishes to proceed ?Plan for repair of paraesophageal hernia with mesh with Nissen fundoplication ? ?Please note that I spent greater than 40 minutes in this encounter including personally reviewing imaging studies, coordinating her care, placing orders, counseling the patient and performing appropriate documentation ? ? ? ?Caroleen Hamman, MD FACS ?General Surgeon  ?

## 2021-08-01 NOTE — Telephone Encounter (Signed)
Outgoing call is made, left message for patient to call.  Please inform of the following regarding scheduled surgery: ? ?Pre-Admission date/time, COVID Testing date and Surgery date. ? ?Surgery Date: 08/18/21 ?Preadmission Testing Date: 08/09/21 (phone 8a-1p) ?Covid Testing Date: Not needed.   ? ?Also patient will need to call at [phone], between 1-3:00pm the day before surgery, to find out what time to arrive for surgery.   ? ?

## 2021-08-01 NOTE — Progress Notes (Addendum)
Outpatient Surgical Follow Up ? ?08/01/2021 ? ?April Leblanc is an 71 y.o. female.  ? ?Chief Complaint  ?Patient presents with  ? Follow-up  ? ? 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  Cardiovascular performance.  She did have a CT and barium swallow that have personally reviewed showing evidence of a moderate size paraesophageal hernia type III about a third of the stomach is within the mediastinum.  She continues to have symptoms of reflux.  EGD showing no strictures.  No Barrett's. ?Is here for surgical discussion. I have shared the images w her ?She did have a prior history of abdominal hysterectomy does have a lower midline laparotomy scar ? ?Past Medical History:  ?Diagnosis Date  ? Anemia   ? Ankle sprain 03/2017  ? Arthritis   ? Breast cancer ([place]) 2018  ? Breast cancer ([place]) 03/2017  ? Depression   ? Diabetes mellitus without complication ([place])   ? Fracture of distal end of radius 03/2017  ? left  ? Genetic testing 05/04/2017  ? Multi-Cancer panel (83 genes) @ [facility] - No pathogenic mutations detected  ? GERD (gastroesophageal reflux disease)   ? Headache   ? Hyperlipidemia   ? Hypothyroidism   ? Personal history of radiation therapy   ? Pre-diabetes 03/2017  ? follows healthy diabetic diet  ? Sleep apnea   ? has not used cpap in a long time  ? Vitamin B 12 deficiency   ? ? ?Past Surgical History:  ?Procedure Laterality Date  ? ABDOMINAL HYSTERECTOMY  1983  ? APPENDECTOMY  1977  ? when she had c-section  ? BREAST BIOPSY Left 03/27/2017  ? INVASIVE MAMMARY CARCINOMA Grade 1 UIG  ? BREAST LUMPECTOMY Left 04/13/2017  ? 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 CESAREAN SECTION    ? COLONOSCOPY    ? COLONOSCOPY WITH PROPOFOL N/A 02/25/2020  ? Procedure: COLONOSCOPY WITH PROPOFOL;  Surgeon: [name], MD;  Location: [facility] ENDOSCOPY;  Service: Gastroenterology;  Laterality: N/A;  ?  ESOPHAGOGASTRODUODENOSCOPY (EGD) WITH PROPOFOL N/A 02/05/2015  ? Procedure: ESOPHAGOGASTRODUODENOSCOPY (EGD) WITH PROPOFOL;  Surgeon: Josefine Class, MD;  Location: [facility] ENDOSCOPY;  Service: Endoscopy;  Laterality: N/A;  ? ESOPHAGOGASTRODUODENOSCOPY (EGD) WITH PROPOFOL N/A 02/25/2020  ? Procedure: ESOPHAGOGASTRODUODENOSCOPY (EGD) WITH PROPOFOL;  Surgeon: [name], MD;  Location: [facility] ENDOSCOPY;  Service: Gastroenterology;  Laterality: N/A;  ? PARTIAL MASTECTOMY WITH NEEDLE LOCALIZATION Left 04/13/2017  ? Procedure: PARTIAL MASTECTOMY WITH NEEDLE LOCALIZATION;  Surgeon: Leonie Green, MD;  Location: [facility] ORS;  Service: General;  Laterality: Left;  ? PATELLA FRACTURE SURGERY Left 1991  ? screws in place  ? SAVORY DILATION N/A 02/05/2015  ? Procedure: SAVORY DILATION;  Surgeon: Josefine Class, MD;  Location: [facility] ENDOSCOPY;  Service: Endoscopy;  Laterality: N/A;  ? SENTINEL NODE BIOPSY Left 04/13/2017  ? Procedure: SENTINEL NODE BIOPSY;  Surgeon: Leonie Green, MD;  Location: [facility] ORS;  Service: General;  Laterality: Left;  ? SINUS SURGERY WITH INSTATRAK    ? ? ?Family History  ?Problem Relation Age of Onset  ? Breast cancer Maternal Aunt 70  ?     deceased 75s  ? Breast cancer Cousin 53  ?     daughter of mat aunt with breast cancer  ? Breast cancer Cousin 33  ?     daughter of mat aunt with breast cancer  ? Deep vein thrombosis Mother   ?  Heart disease Mother   ? Stroke Mother   ? Heart disease Father   ? Parkinson's disease Father   ? Heart disease Sister   ? Stroke Sister   ? Skin cancer Brother   ? Heart disease Brother   ? Alzheimer's disease Brother   ? Lung cancer Paternal Uncle   ?     deceased 30s  ? Heart disease Brother   ? Alzheimer's disease Brother   ? Heart disease Brother   ? Alzheimer's disease Brother   ? Cancer Brother   ?     unclear primary; currently 70s  ? Cirrhosis Sister   ? Alzheimer's disease Sister   ? Kidney cancer Son 95  ?     nephrectomy @ [facility]   ? ? ?Social History:  reports that she has never smoked. She has been exposed to tobacco smoke. She has never used smokeless tobacco. She reports that she does not drink alcohol and does not use drugs. ? ?Allergies:  ?Allergies  ?Allergen Reactions  ? Amoxicillin Hives, Itching and Other (See Comments)  ?  Has patient had a PCN reaction causing immediate rash, facial/tongue/throat swelling, SOB or lightheadedness with hypotension: No ?Has patient had a PCN reaction causing severe rash involving mucus membranes or skin necrosis: No ?Has patient had a PCN reaction that required hospitalization: No ?Has patient had a PCN reaction occurring within the last 10 years: No ?If all of the above answers are "NO", then may proceed with Cephalosporin use. ?  ? Erythromycin Hives and Itching  ? Sulfa Antibiotics Hives and Itching  ? Contrast Media [Iodinated Contrast Media] Rash  ?  Red rash and itching. Topical iodine not a problem.  ? Morphine Nausea And Vomiting and Other (See Comments)  ?  Pretty severe vomiting. Can take hydrocodone and oxycodone ? ?  ? ? ?Medications reviewed. ? ? ? ?ROS ?Full ROS performed and is otherwise negative other than what is stated in HPI ? ? ?BP 133/90   Pulse 93   Temp 98.3 ?F (36.8 ?C)   Ht 5' 7.5" (1.715 m)   Wt 181 lb (82.1 kg)   SpO2 98%   BMI 27.93 kg/m?  ? ?Physical Exam ?CONSTITUTIONAL: NAD. ?EYES: Pupils are equal, round, and reactive to light, Sclera are non-icteric. ?EARS, NOSE, MOUTH AND THROAT: The oropharynx is clear. The oral mucosa is pink and moist. Hearing is intact to voice. ?LYMPH NODES:  Lymph nodes in the neck are normal. ?RESPIRATORY:  Lungs are clear. There is normal respiratory effort, with equal breath sounds bilaterally, and without pathologic use of accessory muscles. ?CARDIOVASCULAR: Heart is regular without murmurs, gallops, or rubs. ?GI: The abdomen is  soft, nontender, and nondistended. There are no palpable masses. There is no hepatosplenomegaly. There are  normal bowel sounds. Lower midline laparotomy scar ?GU: Rectal deferred.   ?MUSCULOSKELETAL: Normal muscle strength and tone. No cyanosis or edema.   ?SKIN: Turgor is good and there are no pathologic skin lesions or ulcers. ?NEUROLOGIC: Motor and sensation is grossly normal. Cranial nerves are grossly intact. ?PSYCH:  Oriented to person, place and time. Affect  ? ?Assessment/Plan: ?71 year old female with significant type III paraesophageal hernia.  I had an extensive discussion with the patient and I do recommend repair.  I do think that she will be a good candidate for robotic approach.  Procedure discussed with the patient in detail.  Risk, benefits and possible complications including but not limited to: Bleeding, infection, dysphagia, esophageal bowel injuries, pneumothoraxes.  I also discussed with her postoperative care and diet.  She wishes to proceed ?Plan for repair of paraesophageal hernia with mesh with Nissen fundoplication ? ?Please note that I spent greater than 40 minutes in this encounter including personally reviewing imaging studies, coordinating her care, placing orders, counseling the patient and performing appropriate documentation ? ? ? ?Caroleen Hamman, MD FACS ?General Surgeon  ?

## 2021-08-01 NOTE — Telephone Encounter (Signed)
Incoming from patient, she is now informed of all dates regarding her surgery and verbalized understanding.   ? ?

## 2021-08-01 NOTE — Patient Instructions (Addendum)
Our surgery scheduler will call you within 24-48 hours to schedule surgery. Please have the [place] surgery sheet available when speaking with her.  ? ?Plan to be in the hospital for 1-2 days if the minimally invasive surgery is completed without having to make a bigger incision. If the bigger incision is made, you will most likely need to be in the hospital 4-6 days. You will be on a liquid diet and need to recover for 2 weeks following your surgery prior to doing any of your normal activities. At the 2 week mark, we will see you in the office and if you are doing ok we will advance your diet and activity level as you tolerate. ? ?Please see your Blue (Pre-care) Sheet for more information regarding your surgery. Our surgery scheduler will call you to verify surgery date and to go over information ? ?If you have FMLA or disability paperwork that needs filled out you may drop this off at our office or this can be faxed to [phone]. ? ?Please call our office with any questions or concerns that you have regarding your surgery and recovery. ? ? ? ? ?Laparoscopic Nissen Fundoplication ?Laparoscopic Nissen fundoplication is surgery to relieve heartburn and other problems caused by gastric fluids flowing up into your esophagus. The esophagus is the tube that carries food and liquid from your throat to your stomach. Normally, the muscle that sits between your stomach and your esophagus (lower esophageal sphincter or LES) keeps stomach fluids in your stomach. ?In some people, the LES does not work properly, and stomach fluids flow up into the esophagus. This can happen when part of the stomach bulges through the LES (hiatal hernia). The backward flow of stomach fluids can cause a type of severe and long-standing heartburn that is called gastroesophageal reflux disease (GERD). You may need this surgery if other treatments for GERD have not helped. ?In a laparoscopic Nissen fundoplication, the upper part of your stomach is  wrapped around the lower part of your esophagus to strengthen the LES and prevent reflux. If you have a hiatal hernia, it will also be repaired with this surgery. The procedure is done through several small incisions in your abdomen. It is performed using a thin, telescopic instrument (laparoscope) and other instruments that can pass through the scope or through other small incisions. ?Tell a health care provider about: ?Any allergies you have. ?All medicines you are taking, including vitamins, herbs, eye drops, creams, and over-the-counter medicines. ?Any problems you or family members have had with anesthetic medicines. ?Any blood disorders you have. ?Any surgeries you have had. ?Any medical conditions you have. ?What are the risks? ?Generally, this is a safe procedure. However, problems may occur, including: ?Difficulty swallowing (dysphagia). ?Bloating. ?Nausea or vomiting. ?Damage to the lung, causing a collapsed lung. ?Infection or bleeding. ?What happens before the procedure? ?Ask your health care provider about: ?Changing or stopping your regular medicines. This is especially important if you are taking diabetes medicines or blood thinners. ?Taking medicines such as aspirin and ibuprofen. These medicines can thin your blood. Do not take these medicines before your procedure if your health care provider asks you not to. ?Follow your health care provider?s instructions about eating or drinking restrictions. ?Plan to have someone take you home after the procedure. ?What happens during the procedure? ?An IV tube will be inserted into one of your veins. It will be used to give you fluids and medicines during the procedure. ?You will be given a medicine  that makes you fall asleep (general anesthetic). ?Your abdomen will be cleaned with a germ-killing solution (antiseptic). ?The surgeon will make a small incision in your abdomen and insert a tube through the incision. ?Your abdomen will be filled with a gas. This  helps the surgeon to see your organs more easily and it makes more space to work. ?The surgeon will insert the laparoscope through the incision. The scope has a camera that will send pictures to a monitor in the operating room. ?The surgeon will make several other small incisions in your abdomen to insert the other instruments that are needed during the procedure. ?Another instrument (dilator) will be passed through your mouth and down your esophagus into the upper part of your stomach. The dilator will prevent your LES from being closed too tightly during surgery. ?The surgeon will pass the top portion of your stomach behind the lower part of your esophagus and wrap it all the way around. This will be stitched into place. ?If you have a hiatal hernia, it will be repaired during this procedure. ?All instruments will be removed, and your incisions will be closed under your skin with stitches (sutures). Skin adhesive strips may also be used. ?A bandage (dressing) will be placed on your skin over the incisions. ?The procedure may vary among health care providers and hospitals. ?What happens after the procedure? ?You will be moved to a recovery area. ?Your blood pressure, heart rate, breathing rate, and blood oxygen level will be monitored often until the medicines you were given have worn off. ?You will be given pain medicine as needed. ?Your IV tube will be kept in until you are able to drink fluids. ?This information is not intended to replace advice given to you by your health care provider. Make sure you discuss any questions you have with your health care provider. ?Document Released: 05/08/2014 Document Revised: 09/23/2015 Document Reviewed: 12/17/2013 ?Elsevier Interactive Patient Education ? 2017 [employer] ? ? ?Laparoscopic Nissen Fundoplication, Care After ?Refer to this sheet in the next few weeks. These instructions provide you with information about caring for yourself after your procedure. Your health  care provider may also give you more specific instructions. Your treatment has been planned according to current medical practices, but problems sometimes occur. Call your health care provider if you have any problems or questions after your procedure. ?What can I expect after the procedure? ?After the procedure, it is common to have: ?Difficulty swallowing (dysphagia). ?Excess gas (bloating). ?Follow these instructions at home: ?Medicines  ?Take medicines only as directed by your health care provider. ?Do not drive or operate heavy machinery while taking pain medicine. ?Incision care  ?There are many different ways to close and cover an incision, including stitches (sutures), skin glue, and adhesive strips. Follow your health care provider's instructions about: ?Incision care. ?Bandage (dressing) changes and removal. ?Incision closure removal. ?Check your incision areas every day for signs of infection. Watch for: ?Redness, swelling, or pain. ?Fluid, blood, or pus. ?Do not take baths, swim, or use a hot tub until your health care provider approves. Take showers as directed by your health care provider. ?Eating and drinking  ?Follow your health care provider's instructions about eating. ?You may need to follow a liquid-only diet for 2 weeks, followed by a diet of soft foods for 2 weeks. ?You should return to your usual diet gradually. ?Drink enough fluid to keep your urine clear or pale yellow. ?Activity  ?Return to your normal activities as directed by your  health care provider. Ask your health care provider what activities are safe for you. ?Avoid strenuous exercise. ?Do not lift anything that is heavier than 10 lb (4.5 kg). ?Ask your health care provider when you can: ?Return to sexual activity. ?Drive. ?Go back to work. ?Contact a health care provider if: ?You have a fever. ?Your pain gets worse or is not helped by medicine. ?You have frequent nausea or vomiting. ?You have continued abdominal bloating. ?You have  an ongoing (persistent) cough. ?You have redness, swelling, or pain in any incision areas. ?You have fluid, blood, or pus coming from any incisions. ?Get help right away if: ?You have trouble breathin

## 2021-08-05 ENCOUNTER — Ambulatory Visit
Admission: RE | Admit: 2021-08-05 | Discharge: 2021-08-05 | Disposition: A | Discharge status: Home / Self Care | Payer: Medicare Other | Source: Ambulatory Visit | Attending: Oncology | Admitting: Oncology

## 2021-08-05 DIAGNOSIS — Z1231 Encounter for screening mammogram for malignant neoplasm of breast: Secondary | ICD-10-CM | POA: Insufficient documentation

## 2021-08-05 DIAGNOSIS — Z170 Estrogen receptor positive status [ER+]: Secondary | ICD-10-CM | POA: Insufficient documentation

## 2021-08-05 DIAGNOSIS — C50412 Malignant neoplasm of upper-outer quadrant of left female breast: Secondary | ICD-10-CM | POA: Insufficient documentation

## 2021-08-05 DIAGNOSIS — Z79899 Other long term (current) drug therapy: Secondary | ICD-10-CM | POA: Insufficient documentation

## 2021-08-09 ENCOUNTER — Other Ambulatory Visit: Payer: Self-pay

## 2021-08-09 ENCOUNTER — Other Ambulatory Visit
Admission: RE | Admit: 2021-08-09 | Discharge: 2021-08-09 | Disposition: A | Discharge status: Home / Self Care | Payer: Medicare Other | Source: Ambulatory Visit | Attending: Surgery | Admitting: Surgery

## 2021-08-09 HISTORY — DX: Pneumonia, unspecified organism: J18.9

## 2021-08-09 HISTORY — DX: Anxiety disorder, unspecified: F41.9

## 2021-08-09 HISTORY — DX: Personal history of other diseases of the digestive system: Z87.19

## 2021-08-09 NOTE — Patient Instructions (Addendum)
Your procedure is scheduled on: 08/18/21 - Thursday ?Report to the Registration Desk on the 1st floor of the [facility]. ?To find out your arrival time, please call [phone] between 1PM - 3PM on: 08/17/21 - Wednesday ? ?REMEMBER: ?Instructions that are not followed completely may result in serious medical risk, up to and including death; or upon the discretion of your surgeon and anesthesiologist your surgery may need to be rescheduled. ? ?Do not eat food after midnight the night before surgery.  ?No gum chewing, lozengers or hard candies. ? ?You may however, drink CLEAR liquids up to 2 hours before you are scheduled to arrive for your surgery. Do not drink anything within 2 hours of your scheduled arrival time. ? ?Clear liquids include: ?- water  ? ?Type 1 and Type 2 diabetics should only drink water. ? ?TAKE THESE MEDICATIONS THE MORNING OF SURGERY WITH A SIP OF WATER: ? ?- amphetamine-dextroamphetamine (ADDERALL XR) 10 MG 24 hr capsule ?- anastrozole (ARIMIDEX) 1 MG tablet ?- atorvastatin (LIPITOR) 40 MG tablet ?- esomeprazole (NEXIUM) 40 MG capsule, (take one the night before and one on the morning of surgery - helps to prevent nausea after surgery.) ?- levothyroxine (SYNTHROID, LEVOTHROID) 150 MCG tablet ?- LORazepam (ATIVAN) 0.5 MG tablet ?- oxybutynin (DITROPAN-XL) 5 MG 24 hr tablet ?- vortioxetine HBr (TRINTELLIX) 20 MG TABS tablet ? ?Stop taking MetFORMIN (GLUCOPHAGE-XR) 500 MG 24 hr tablet beginning 08/16/21, may resume taking the day after surgery. ? ?One week prior to surgery: ?Stop Anti-inflammatories (NSAIDS) such as Advil, Aleve, Ibuprofen, Motrin, Naproxen, Naprosyn and Aspirin based products such as Excedrin, Goodys Powder, BC Powder. ? ?Stop ANY OVER THE COUNTER supplements until after surgery. ? ?You may however, continue to take Tylenol if needed for pain up until the day of surgery. ? ?No Alcohol for 24 hours before or after surgery. ? ?No Smoking including e-cigarettes for 24 hours  prior to surgery.  ?No chewable tobacco products for at least 6 hours prior to surgery.  ?No nicotine patches on the day of surgery. ? ?Do not use any "recreational" drugs for at least a week prior to your surgery.  ?Please be advised that the combination of cocaine and anesthesia may have negative outcomes, up to and including death. ?If you test positive for cocaine, your surgery will be cancelled. ? ?On the morning of surgery brush your teeth with toothpaste and water, you may rinse your mouth with mouthwash if you wish. ?Do not swallow any toothpaste or mouthwash. ? ?Do not wear jewelry, make-up, hairpins, clips or nail polish. ? ?Do not wear lotions, powders, or perfumes.  ? ?Do not shave body from the neck down 48 hours prior to surgery just in case you cut yourself which could leave a site for infection.  ?Also, freshly shaved skin may become irritated if using the CHG soap. ? ?Contact lenses, hearing aids and dentures may not be worn into surgery. ? ?Do not bring valuables to the hospital. [facility] is not responsible for any missing/lost belongings or valuables.  ? ?Notify your doctor if there is any change in your medical condition (cold, fever, infection). ? ?Wear comfortable clothing (specific to your surgery type) to the hospital. ? ?After surgery, you can help prevent lung complications by doing breathing exercises.  ?Take deep breaths and cough every 1-2 hours. Your doctor may order a device called an Incentive Spirometer to help you take deep breaths. ?When coughing or sneezing, hold a pillow firmly against your incision with both  hands. This is called ?splinting.? Doing this helps protect your incision. It also decreases belly discomfort. ? ?If you are being admitted to the hospital overnight, leave your suitcase in the car. ?After surgery it may be brought to your room. ? ?If you are being discharged the day of surgery, you will not be allowed to drive home. ?You will need a responsible adult (18  years or older) to drive you home and stay with you that night.  ? ?If you are taking public transportation, you will need to have a responsible adult (18 years or older) with you. ?Please confirm with your physician that it is acceptable to use public transportation.  ? ?Please call the [place] [facility] at [phone] if you have any questions about these instructions. ? ?Surgery Visitation Policy: ? ?Patients undergoing a surgery or procedure may have two family members or support persons with them as long as the person is not COVID-19 positive or experiencing its symptoms.  ? ?Inpatient Visitation:   ? ?Visiting hours are 7 a.m. to 8 p.m. ?Up to four visitors are allowed at one time in a patient room, including children. The visitors may rotate out with other people during the day. One designated support person (adult) may remain overnight.  ?

## 2021-08-18 ENCOUNTER — Other Ambulatory Visit: Payer: Self-pay

## 2021-08-18 ENCOUNTER — Ambulatory Visit: Payer: Medicare Other | Admitting: Certified Registered"

## 2021-08-18 ENCOUNTER — Encounter: Payer: Self-pay | Admitting: Surgery

## 2021-08-18 ENCOUNTER — Encounter
Admission: RE | Disposition: A | Discharge status: Home / Self Care | Payer: Self-pay | Source: Home / Self Care | Attending: Surgery

## 2021-08-18 ENCOUNTER — Observation Stay
Admission: RE | Admit: 2021-08-18 | Discharge: 2021-08-19 | Disposition: A | Discharge status: Home / Self Care | Payer: Medicare Other | Attending: Surgery | Admitting: Surgery

## 2021-08-18 DIAGNOSIS — K449 Diaphragmatic hernia without obstruction or gangrene: Principal | ICD-10-CM | POA: Insufficient documentation

## 2021-08-18 DIAGNOSIS — E119 Type 2 diabetes mellitus without complications: Secondary | ICD-10-CM | POA: Insufficient documentation

## 2021-08-18 DIAGNOSIS — K219 Gastro-esophageal reflux disease without esophagitis: Secondary | ICD-10-CM | POA: Diagnosis not present

## 2021-08-18 DIAGNOSIS — E039 Hypothyroidism, unspecified: Secondary | ICD-10-CM | POA: Diagnosis not present

## 2021-08-18 DIAGNOSIS — Z853 Personal history of malignant neoplasm of breast: Secondary | ICD-10-CM | POA: Diagnosis not present

## 2021-08-18 DIAGNOSIS — Z8719 Personal history of other diseases of the digestive system: Secondary | ICD-10-CM

## 2021-08-18 HISTORY — PX: XI ROBOTIC ASSISTED PARAESOPHAGEAL HERNIA REPAIR: SHX6871

## 2021-08-18 LAB — HEMOGLOBIN A1C
Hgb A1c MFr Bld: 6.8 % — ABNORMAL HIGH (ref 4.8–5.6)
Mean Plasma Glucose: 148.46 mg/dL

## 2021-08-18 LAB — CBC
HCT: 38.4 % (ref 36.0–46.0)
Hemoglobin: 12.1 g/dL (ref 12.0–15.0)
MCH: 26.9 pg (ref 26.0–34.0)
MCHC: 31.5 g/dL (ref 30.0–36.0)
MCV: 85.5 fL (ref 80.0–100.0)
Platelets: 222 10*3/uL (ref 150–400)
RBC: 4.49 MIL/uL (ref 3.87–5.11)
RDW: 14.4 % (ref 11.5–15.5)
WBC: 13.1 10*3/uL — ABNORMAL HIGH (ref 4.0–10.5)
nRBC: 0 % (ref 0.0–0.2)

## 2021-08-18 LAB — GLUCOSE, CAPILLARY
Glucose-Capillary: 149 mg/dL — ABNORMAL HIGH (ref 70–99)
Glucose-Capillary: 190 mg/dL — ABNORMAL HIGH (ref 70–99)
Glucose-Capillary: 191 mg/dL — ABNORMAL HIGH (ref 70–99)
Glucose-Capillary: 168 mg/dL — ABNORMAL HIGH (ref 70–99)

## 2021-08-18 LAB — CREATININE, SERUM
Creatinine, Ser: 0.77 mg/dL (ref 0.44–1.00)
GFR, Estimated: >60 mL/min (ref >60)

## 2021-08-18 LAB — HIV ANTIBODY (ROUTINE TESTING W REFLEX): HIV Screen 4th Generation wRfx: NONREACTIVE

## 2021-08-18 SURGERY — REPAIR, HERNIA, PARAESOPHAGEAL, ROBOT-ASSISTED
Anesthesia: General

## 2021-08-18 MED ORDER — FENTANYL CITRATE (PF) 100 MCG/2ML IJ SOLN
INTRAMUSCULAR | Status: AC
  Filled 2021-08-18: qty 2

## 2021-08-18 MED ORDER — LORATADINE 10 MG PO TABS: 10.0000 mg | ORAL_TABLET | Freq: Every evening | ORAL | Status: DC

## 2021-08-18 MED ORDER — SODIUM CHLORIDE 0.9 % IV SOLN: INTRAVENOUS | Status: DC

## 2021-08-18 MED ORDER — ACETAMINOPHEN 500 MG PO TABS
ORAL_TABLET | ORAL | Status: AC
  Administered 2021-08-18: 1000 mg via ORAL
  Filled 2021-08-18: qty 2

## 2021-08-18 MED ORDER — BUPIVACAINE LIPOSOME 1.3 % IJ SUSP
INTRAMUSCULAR | Status: AC
  Filled 2021-08-18: qty 20

## 2021-08-18 MED ORDER — KETOROLAC TROMETHAMINE 15 MG/ML IJ SOLN: 15.0000 mg | Freq: Four times a day (QID) | INTRAMUSCULAR | Status: AC

## 2021-08-18 MED ORDER — INSULIN ASPART 100 UNIT/ML IJ SOLN
4.0000 [IU] | Freq: Three times a day (TID) | INTRAMUSCULAR | Status: DC
  Administered 2021-08-19: 4 [IU] via SUBCUTANEOUS

## 2021-08-18 MED ORDER — DIPHENHYDRAMINE HCL 12.5 MG/5ML PO ELIX: 12.5000 mg | ORAL_SOLUTION | Freq: Four times a day (QID) | ORAL | Status: DC | PRN

## 2021-08-18 MED ORDER — PHENYLEPHRINE HCL-NACL 20-0.9 MG/250ML-% IV SOLN
INTRAVENOUS | Status: DC | PRN
  Administered 2021-08-18: 20 ug/min via INTRAVENOUS

## 2021-08-18 MED ORDER — ROCURONIUM BROMIDE 100 MG/10ML IV SOLN
INTRAVENOUS | Status: DC | PRN
Start: 2021-08-18 — End: 2021-08-18
  Administered 2021-08-18 (×2): 20 mg via INTRAVENOUS
  Administered 2021-08-18: 50 mg via INTRAVENOUS

## 2021-08-18 MED ORDER — VISTASEAL 10 ML SINGLE DOSE KIT
PACK | CUTANEOUS | Status: AC
Start: 2021-08-18 — End: ?
  Filled 2021-08-18: qty 10

## 2021-08-18 MED ORDER — KETOROLAC TROMETHAMINE 15 MG/ML IJ SOLN
INTRAMUSCULAR | Status: AC
Start: 2021-08-18 — End: 2021-08-18
  Administered 2021-08-18: 15 mg via INTRAVENOUS
  Filled 2021-08-18: qty 1

## 2021-08-18 MED ORDER — INSULIN ASPART 100 UNIT/ML IJ SOLN
INTRAMUSCULAR | Status: AC
  Administered 2021-08-18: 4 [IU] via SUBCUTANEOUS
  Filled 2021-08-18: qty 1

## 2021-08-18 MED ORDER — SUGAMMADEX SODIUM 200 MG/2ML IV SOLN
INTRAVENOUS | Status: DC | PRN
  Administered 2021-08-18: 300 mg via INTRAVENOUS

## 2021-08-18 MED ORDER — LIDOCAINE HCL (CARDIAC) PF 100 MG/5ML IV SOSY
PREFILLED_SYRINGE | INTRAVENOUS | Status: DC | PRN
  Administered 2021-08-18: 100 mg via INTRAVENOUS

## 2021-08-18 MED ORDER — DIPHENHYDRAMINE HCL 50 MG/ML IJ SOLN: 12.5000 mg | Freq: Four times a day (QID) | INTRAMUSCULAR | Status: DC | PRN

## 2021-08-18 MED ORDER — MIDAZOLAM HCL 2 MG/2ML IJ SOLN
INTRAMUSCULAR | Status: DC | PRN
  Administered 2021-08-18: 2 mg via INTRAVENOUS

## 2021-08-18 MED ORDER — PHENYLEPHRINE HCL (PRESSORS) 10 MG/ML IV SOLN
INTRAVENOUS | Status: DC | PRN
Start: 2021-08-18 — End: 2021-08-18
  Administered 2021-08-18: 160 ug via INTRAVENOUS
  Administered 2021-08-18: 80 ug via INTRAVENOUS
  Administered 2021-08-18 (×2): 160 ug via INTRAVENOUS

## 2021-08-18 MED ORDER — CHLORHEXIDINE GLUCONATE CLOTH 2 % EX PADS: 6.0000 | MEDICATED_PAD | Freq: Once | CUTANEOUS | Status: DC

## 2021-08-18 MED ORDER — KETOROLAC TROMETHAMINE 30 MG/ML IJ SOLN
INTRAMUSCULAR | Status: DC | PRN
  Administered 2021-08-18: 30 mg via INTRAVENOUS

## 2021-08-18 MED ORDER — ENOXAPARIN SODIUM 40 MG/0.4ML IJ SOSY
40.0000 mg | PREFILLED_SYRINGE | INTRAMUSCULAR | Status: DC
  Administered 2021-08-19: 40 mg via SUBCUTANEOUS

## 2021-08-18 MED ORDER — PROCHLORPERAZINE EDISYLATE 10 MG/2ML IJ SOLN
5.0000 mg | Freq: Four times a day (QID) | INTRAMUSCULAR | Status: DC | PRN
  Filled 2021-08-18: qty 2

## 2021-08-18 MED ORDER — LEVOTHYROXINE SODIUM 150 MCG PO TABS
150.0000 ug | ORAL_TABLET | Freq: Every day | ORAL | Status: DC
  Administered 2021-08-19: 150 ug via ORAL
  Filled 2021-08-18: qty 1

## 2021-08-18 MED ORDER — HYDROMORPHONE HCL 1 MG/ML IJ SOLN
INTRAMUSCULAR | Status: AC
  Filled 2021-08-18: qty 0.5

## 2021-08-18 MED ORDER — METHOCARBAMOL 1000 MG/10ML IJ SOLN
500.0000 mg | Freq: Three times a day (TID) | INTRAVENOUS | Status: DC | PRN
  Filled 2021-08-18: qty 5

## 2021-08-18 MED ORDER — OXYBUTYNIN CHLORIDE ER 5 MG PO TB24
5.0000 mg | ORAL_TABLET | Freq: Every day | ORAL | Status: DC
  Filled 2021-08-18: qty 1

## 2021-08-18 MED ORDER — GABAPENTIN 300 MG PO CAPS: 300.0000 mg | ORAL_CAPSULE | ORAL | Status: AC

## 2021-08-18 MED ORDER — CEFAZOLIN SODIUM-DEXTROSE 2-4 GM/100ML-% IV SOLN
INTRAVENOUS | Status: AC
  Filled 2021-08-18: qty 100

## 2021-08-18 MED ORDER — EPHEDRINE SULFATE (PRESSORS) 50 MG/ML IJ SOLN
INTRAMUSCULAR | Status: DC | PRN
  Administered 2021-08-18 (×2): 5 mg via INTRAVENOUS

## 2021-08-18 MED ORDER — INSULIN ASPART 100 UNIT/ML IJ SOLN: 0.0000 [IU] | Freq: Every day | INTRAMUSCULAR | Status: DC

## 2021-08-18 MED ORDER — ONDANSETRON HCL 4 MG/2ML IJ SOLN
INTRAMUSCULAR | Status: DC | PRN
  Administered 2021-08-18 (×2): 4 mg via INTRAVENOUS

## 2021-08-18 MED ORDER — ACETAMINOPHEN 500 MG PO TABS
1000.0000 mg | ORAL_TABLET | Freq: Four times a day (QID) | ORAL | Status: DC
  Administered 2021-08-19: 1000 mg via ORAL

## 2021-08-18 MED ORDER — CHLORHEXIDINE GLUCONATE 0.12 % MT SOLN
OROMUCOSAL | Status: AC
  Administered 2021-08-18: 15 mL via OROMUCOSAL
  Filled 2021-08-18: qty 15

## 2021-08-18 MED ORDER — VASOPRESSIN 20 UNIT/ML IV SOLN
INTRAVENOUS | Status: DC | PRN
  Administered 2021-08-18: 2 [IU] via INTRAVENOUS

## 2021-08-18 MED ORDER — SODIUM CHLORIDE 0.9 % IR SOLN
Status: DC | PRN
Start: 2021-08-18 — End: 2021-08-18
  Administered 2021-08-18: 100 mL

## 2021-08-18 MED ORDER — OXYCODONE HCL 5 MG PO TABS
ORAL_TABLET | ORAL | Status: AC
  Filled 2021-08-18: qty 1

## 2021-08-18 MED ORDER — ARIPIPRAZOLE 15 MG PO TABS
15.0000 mg | ORAL_TABLET | Freq: Every day | ORAL | Status: DC
  Administered 2021-08-18: 15 mg via ORAL
  Filled 2021-08-18 (×3): qty 1

## 2021-08-18 MED ORDER — PROCHLORPERAZINE MALEATE 10 MG PO TABS
10.0000 mg | ORAL_TABLET | Freq: Four times a day (QID) | ORAL | Status: DC | PRN
  Filled 2021-08-18: qty 1

## 2021-08-18 MED ORDER — KETOROLAC TROMETHAMINE 15 MG/ML IJ SOLN: 15.0000 mg | Freq: Four times a day (QID) | INTRAMUSCULAR | Status: DC | PRN

## 2021-08-18 MED ORDER — KETAMINE HCL 50 MG/5ML IJ SOSY
PREFILLED_SYRINGE | INTRAMUSCULAR | Status: AC
  Filled 2021-08-18: qty 5

## 2021-08-18 MED ORDER — KETAMINE HCL 10 MG/ML IJ SOLN
INTRAMUSCULAR | Status: DC | PRN
  Administered 2021-08-18: 20 mg via INTRAVENOUS

## 2021-08-18 MED ORDER — GABAPENTIN 300 MG PO CAPS
ORAL_CAPSULE | ORAL | Status: AC
  Administered 2021-08-18: 300 mg via ORAL
  Filled 2021-08-18: qty 1

## 2021-08-18 MED ORDER — VISTASEAL 10 ML SINGLE DOSE KIT
PACK | CUTANEOUS | Status: DC | PRN
  Administered 2021-08-18: 10 mL via TOPICAL

## 2021-08-18 MED ORDER — OXYCODONE HCL 5 MG PO TABS
ORAL_TABLET | ORAL | Status: AC
  Administered 2021-08-18: 5 mg via ORAL
  Filled 2021-08-18: qty 1

## 2021-08-18 MED ORDER — ONDANSETRON HCL 4 MG/2ML IJ SOLN: 4.0000 mg | Freq: Four times a day (QID) | INTRAMUSCULAR | Status: DC | PRN

## 2021-08-18 MED ORDER — FENTANYL CITRATE (PF) 100 MCG/2ML IJ SOLN
INTRAMUSCULAR | Status: DC | PRN
  Administered 2021-08-18 (×3): 50 ug via INTRAVENOUS

## 2021-08-18 MED ORDER — BUPIVACAINE-EPINEPHRINE (PF) 0.25% -1:200000 IJ SOLN
INTRAMUSCULAR | Status: AC
  Filled 2021-08-18: qty 30

## 2021-08-18 MED ORDER — CEFAZOLIN SODIUM-DEXTROSE 2-4 GM/100ML-% IV SOLN
2.0000 g | INTRAVENOUS | Status: AC
  Administered 2021-08-18: 2 g via INTRAVENOUS

## 2021-08-18 MED ORDER — ONDANSETRON 4 MG PO TBDP: 4.0000 mg | ORAL_TABLET | Freq: Four times a day (QID) | ORAL | Status: DC | PRN

## 2021-08-18 MED ORDER — TRAZODONE HCL 50 MG PO TABS
150.0000 mg | ORAL_TABLET | Freq: Every day | ORAL | Status: DC
  Administered 2021-08-18: 150 mg via ORAL
  Filled 2021-08-18: qty 1

## 2021-08-18 MED ORDER — HYDROMORPHONE HCL 1 MG/ML IJ SOLN
0.5000 mg | INTRAMUSCULAR | Status: DC | PRN
  Administered 2021-08-18: 0.5 mg via INTRAVENOUS

## 2021-08-18 MED ORDER — INSULIN ASPART 100 UNIT/ML IJ SOLN
0.0000 [IU] | Freq: Three times a day (TID) | INTRAMUSCULAR | Status: DC
  Administered 2021-08-18: 3 [IU] via SUBCUTANEOUS

## 2021-08-18 MED ORDER — ACETAMINOPHEN 500 MG PO TABS: 1000.0000 mg | ORAL_TABLET | ORAL | Status: AC

## 2021-08-18 MED ORDER — FENTANYL CITRATE (PF) 100 MCG/2ML IJ SOLN
INTRAMUSCULAR | Status: AC
  Administered 2021-08-18: 25 ug via INTRAVENOUS
  Filled 2021-08-18: qty 2

## 2021-08-18 MED ORDER — DEXAMETHASONE SODIUM PHOSPHATE 10 MG/ML IJ SOLN
INTRAMUSCULAR | Status: DC | PRN
  Administered 2021-08-18: 5 mg via INTRAVENOUS

## 2021-08-18 MED ORDER — LORAZEPAM 1 MG PO TABS: 0.5000 mg | ORAL_TABLET | Freq: Three times a day (TID) | ORAL | Status: DC

## 2021-08-18 MED ORDER — GLYCOPYRROLATE 0.2 MG/ML IJ SOLN
INTRAMUSCULAR | Status: DC | PRN
  Administered 2021-08-18: .2 mg via INTRAVENOUS

## 2021-08-18 MED ORDER — PHENYLEPHRINE HCL-NACL 20-0.9 MG/250ML-% IV SOLN
INTRAVENOUS | Status: AC
  Filled 2021-08-18: qty 250

## 2021-08-18 MED ORDER — MIDAZOLAM HCL 2 MG/2ML IJ SOLN
INTRAMUSCULAR | Status: AC
Start: 2021-08-18 — End: ?
  Filled 2021-08-18: qty 2

## 2021-08-18 MED ORDER — BUPIVACAINE-EPINEPHRINE 0.25% -1:200000 IJ SOLN
INTRAMUSCULAR | Status: DC | PRN
  Administered 2021-08-18: 50 mL

## 2021-08-18 MED ORDER — LORAZEPAM 1 MG PO TABS
ORAL_TABLET | ORAL | Status: AC
  Administered 2021-08-18: 0.5 mg via ORAL
  Filled 2021-08-18: qty 1

## 2021-08-18 MED ORDER — OXYCODONE HCL 5 MG/5ML PO SOLN: 5.0000 mg | Freq: Once | ORAL | Status: AC | PRN

## 2021-08-18 MED ORDER — OXYCODONE HCL 5 MG PO TABS: 5.0000 mg | ORAL_TABLET | Freq: Once | ORAL | Status: AC | PRN

## 2021-08-18 MED ORDER — 0.9 % SODIUM CHLORIDE (POUR BTL) OPTIME
TOPICAL | Status: DC | PRN
Start: 2021-08-18 — End: 2021-08-18
  Administered 2021-08-18: 150 mL

## 2021-08-18 MED ORDER — ORAL CARE MOUTH RINSE: 15.0000 mL | Freq: Once | OROMUCOSAL | Status: AC

## 2021-08-18 MED ORDER — PROPOFOL 10 MG/ML IV BOLUS
INTRAVENOUS | Status: DC | PRN
  Administered 2021-08-18: 120 mg via INTRAVENOUS

## 2021-08-18 MED ORDER — FENTANYL CITRATE (PF) 100 MCG/2ML IJ SOLN
25.0000 ug | INTRAMUSCULAR | Status: AC | PRN
  Administered 2021-08-18: 50 ug via INTRAVENOUS
  Administered 2021-08-18 (×3): 25 ug via INTRAVENOUS
  Administered 2021-08-18: 50 ug via INTRAVENOUS

## 2021-08-18 MED ORDER — OXYCODONE HCL 5 MG PO TABS
5.0000 mg | ORAL_TABLET | ORAL | Status: DC | PRN
  Administered 2021-08-18: 5 mg via ORAL
  Administered 2021-08-19: 10 mg via ORAL

## 2021-08-18 MED ORDER — CHLORHEXIDINE GLUCONATE 0.12 % MT SOLN: 15.0000 mL | Freq: Once | OROMUCOSAL | Status: AC

## 2021-08-18 MED ORDER — METHOCARBAMOL 500 MG PO TABS: 500.0000 mg | ORAL_TABLET | Freq: Three times a day (TID) | ORAL | Status: DC | PRN

## 2021-08-18 SURGICAL SUPPLY — 60 items
APPLICATOR VISTASEAL 35 (MISCELLANEOUS) ×1 IMPLANT
BAG RETRIEVAL 10 (BASKET) ×1
BLADE SURG 15 STRL LF DISP TIS (BLADE) ×1 IMPLANT
BLADE SURG 15 STRL SS (BLADE) ×2
CANNULA REDUC XI 12-8 STAPL (CANNULA) ×2
CANNULA REDUCER 12-8 DVNC XI (CANNULA) ×1 IMPLANT
DERMABOND ADVANCED (GAUZE/BANDAGES/DRESSINGS) ×1
DERMABOND ADVANCED .7 DNX12 (GAUZE/BANDAGES/DRESSINGS) ×1 IMPLANT
DRAPE 3/4 80X56 (DRAPES) ×2 IMPLANT
DRAPE ARM DVNC X/XI (DISPOSABLE) ×4 IMPLANT
DRAPE COLUMN DVNC XI (DISPOSABLE) ×1 IMPLANT
DRAPE DA VINCI XI ARM (DISPOSABLE) ×8
DRAPE DA VINCI XI COLUMN (DISPOSABLE) ×2
ELECT CAUTERY BLADE 6.4 (BLADE) ×2 IMPLANT
ELECT REM PT RETURN 9FT ADLT (ELECTROSURGICAL) ×2
ELECTRODE REM PT RTRN 9FT ADLT (ELECTROSURGICAL) ×1 IMPLANT
GLOVE BIO SURGEON STRL SZ7 (GLOVE) ×6 IMPLANT
GOWN STRL REUS W/ TWL LRG LVL3 (GOWN DISPOSABLE) ×4 IMPLANT
GOWN STRL REUS W/TWL LRG LVL3 (GOWN DISPOSABLE) ×8
GRASPER LAPSCPC 5X45 DSP (INSTRUMENTS) ×2 IMPLANT
IRRIGATION STRYKERFLOW (MISCELLANEOUS) IMPLANT
IRRIGATOR STRYKERFLOW (MISCELLANEOUS) ×2
IV NS 1000ML (IV SOLUTION) ×2
IV NS 1000ML BAXH (IV SOLUTION) IMPLANT
KIT PINK PAD W/HEAD ARE REST (MISCELLANEOUS) ×2
KIT PINK PAD W/HEAD ARM REST (MISCELLANEOUS) ×1 IMPLANT
KIT TURNOVER CYSTO (KITS) ×2 IMPLANT
LABEL OR SOLS (LABEL) ×2 IMPLANT
MANIFOLD NEPTUNE II (INSTRUMENTS) ×2 IMPLANT
MESH BIO-A 7X10 SYN MAT (Mesh General) ×1 IMPLANT
NEEDLE HYPO 22GX1.5 SAFETY (NEEDLE) ×2 IMPLANT
OBTURATOR OPTICAL STANDARD 8MM (TROCAR) ×2
OBTURATOR OPTICAL STND 8 DVNC (TROCAR) ×1
OBTURATOR OPTICALSTD 8 DVNC (TROCAR) ×1 IMPLANT
PACK LAP CHOLECYSTECTOMY (MISCELLANEOUS) ×2 IMPLANT
PENCIL ELECTRO HAND CTR (MISCELLANEOUS) ×2 IMPLANT
SEAL CANN UNIV 5-8 DVNC XI (MISCELLANEOUS) ×3 IMPLANT
SEAL XI 5MM-8MM UNIVERSAL (MISCELLANEOUS) ×6
SEALER VESSEL DA VINCI XI (MISCELLANEOUS) ×2
SEALER VESSEL EXT DVNC XI (MISCELLANEOUS) ×1 IMPLANT
SOLUTION ELECTROLUBE (MISCELLANEOUS) ×2 IMPLANT
SPIKE FLUID TRANSFER (MISCELLANEOUS) ×2 IMPLANT
SPONGE T-LAP 18X18 ~~LOC~~+RFID (SPONGE) ×2 IMPLANT
STAPLER CANNULA SEAL DVNC XI (STAPLE) ×1 IMPLANT
STAPLER CANNULA SEAL XI (STAPLE) ×2
SUT MNCRL 4-0 (SUTURE) ×4
SUT MNCRL 4-0 27XMFL (SUTURE) ×2
SUT SILK 2 0 SH (SUTURE) ×5 IMPLANT
SUT VIC AB 3-0 SH 27 (SUTURE)
SUT VIC AB 3-0 SH 27X BRD (SUTURE) IMPLANT
SUT VICRYL 0 AB UR-6 (SUTURE) ×4 IMPLANT
SUT VLOC 90 S/L VL9 GS22 (SUTURE) ×2 IMPLANT
SUTURE MNCRL 4-0 27XMF (SUTURE) ×1 IMPLANT
SYR 20ML LL LF (SYRINGE) ×2 IMPLANT
SYR 30ML LL (SYRINGE) ×2 IMPLANT
SYS BAG RETRIEVAL 10MM (BASKET) ×1
SYSTEM BAG RETRIEVAL 10MM (BASKET) IMPLANT
TROCAR XCEL NON-BLD 5MMX100MML (ENDOMECHANICALS) ×2 IMPLANT
TUBING EVAC SMOKE HEATED PNEUM (TUBING) ×2 IMPLANT
WATER STERILE IRR 500ML POUR (IV SOLUTION) ×2 IMPLANT

## 2021-08-18 NOTE — Transfer of Care (Signed)
Immediate Anesthesia Transfer of Care Note ? ?Patient: April Leblanc ? ?Procedure(s) Performed: XI ROBOTIC ASSISTED PARAESOPHAGEAL HERNIA REPAIR with RNFA to assist ? ?Patient Location: PACU ? ?Anesthesia Type:General ? ?Level of Consciousness: drowsy and patient cooperative ? ?Airway & Oxygen Therapy: Patient Spontanous Breathing and Patient connected to face mask oxygen ? ?Post-op Assessment: Report given to RN and Post -op Vital signs reviewed and stable ? ?Post vital signs: Reviewed and stable ? ?Last Vitals:  ?Vitals Value Taken Time  ?BP 128/69 08/18/21 1205  ?Temp 36.9 ?C 08/18/21 1205  ?Pulse 98 08/18/21 1207  ?Resp 12 08/18/21 1207  ?SpO2 100 % 08/18/21 1207  ?Vitals shown include unvalidated device data. ? ?Last Pain:  ?Vitals:  ? 08/18/21 0842  ?TempSrc: Temporal  ?PainSc: 0-No pain  ?   ? ?  ? ?Complications: No notable events documented. ?

## 2021-08-18 NOTE — Addendum Note (Signed)
Addendum  created 08/18/21 1332 by Kelton Pillar, CRNA  ? Charge Capture section accepted, Visit diagnoses modified  ?  ?

## 2021-08-18 NOTE — Anesthesia Procedure Notes (Signed)
Procedure Name: Intubation ?Date/Time: 08/18/2021 9:42 AM ?Performed by: Kelton Pillar, CRNA ?Pre-anesthesia Checklist: Patient identified, Emergency Drugs available, Suction available and Patient being monitored ?Patient Re-evaluated:Patient Re-evaluated prior to induction ?Oxygen Delivery Method: Circle system utilized ?Preoxygenation: Pre-oxygenation with 100% oxygen ?Induction Type: IV induction ?Ventilation: Mask ventilation without difficulty ?Laryngoscope Size: McGraph and 3 ?Grade View: Grade I ?Tube type: Oral ?Tube size: 6.5 mm ?Number of attempts: 1 ?Airway Equipment and Method: Stylet and Oral airway ?Placement Confirmation: ETT inserted through vocal cords under direct vision, positive ETCO2, breath sounds checked- equal and bilateral and CO2 detector ?Secured at: 21 cm ?Tube secured with: Tape ?Dental Injury: Teeth and Oropharynx as per pre-operative assessment  ? ? ? ? ?

## 2021-08-18 NOTE — Anesthesia Postprocedure Evaluation (Signed)
Anesthesia Post Note ? ?Patient: April Leblanc ? ?Procedure(s) Performed: XI ROBOTIC ASSISTED PARAESOPHAGEAL HERNIA REPAIR with RNFA to assist ? ?Patient location during evaluation: PACU ?Anesthesia Type: General ?Level of consciousness: awake and alert ?Pain management: pain level controlled ?Vital Signs Assessment: post-procedure vital signs reviewed and stable ?Respiratory status: spontaneous breathing, nonlabored ventilation, respiratory function stable and patient connected to nasal cannula oxygen ?Cardiovascular status: blood pressure returned to baseline and stable ?Postop Assessment: no apparent nausea or vomiting ?Anesthetic complications: no ? ? ?No notable events documented. ? ? ?Last Vitals:  ?Vitals:  ? 08/18/21 1250 08/18/21 1255  ?BP: 114/72   ?Pulse: 97 94  ?Resp: 15 11  ?Temp:    ?SpO2: 98% 95%  ?  ?Last Pain:  ?Vitals:  ? 08/18/21 1255  ?TempSrc:   ?PainSc: 8   ? ? ?  ?  ?  ?  ?  ?  ? ?[name] ? ? ? ? ?

## 2021-08-18 NOTE — Discharge Instructions (Addendum)
In addition to included general post-operative instructions, ? ?Diet: Resume home diet. Recommend following Nissen diet recommendations (hand out given) for 4 weeks post-op ? ?Activity: No heavy lifting >20 pounds (children, pets, laundry, garbage) for 4 weeks, but light activity and walking are encouraged. Do not drive or drink alcohol if taking narcotic pain medications or having pain that might distract from driving. ? ?Wound care: 2 days after surgery (04/22), you may shower/get incision wet with soapy water and pat dry (do not rub incisions), but no baths or submerging incision underwater until follow-up.  ? ?Medications: Resume all home medications. For mild to moderate pain: acetaminophen (Tylenol) or ibuprofen/naproxen (if no kidney disease). Combining Tylenol with alcohol can substantially increase your risk of causing liver disease. Narcotic pain medications, if prescribed, can be used for severe pain, though may cause nausea, constipation, and drowsiness. Do not combine Tylenol and Percocet (or similar) within a 6 hour period as Percocet (and similar) contain(s) Tylenol. If you do not need the narcotic pain medication, you do not need to fill the prescription. ? ?Call office [phone] / [phone]) at any time if any questions, worsening pain, fevers/chills, bleeding, drainage from incision site, or other concerns. ? ?Follow Up: 05/08 at 9:30 am with Dr Dahlia Byes  ?

## 2021-08-18 NOTE — Interval H&P Note (Signed)
History and Physical Interval Note: ? ?08/18/2021 ?8:59 AM ? ?April Leblanc  has presented today for surgery, with the diagnosis of paresophageal hernia.  The various methods of treatment have been discussed with the patient and family. After consideration of risks, benefits and other options for treatment, the patient has consented to  Procedure(s): ?XI ROBOTIC ASSISTED PARAESOPHAGEAL HERNIA REPAIR with RNFA to assist (N/A) as a surgical intervention.  The patient's history has been reviewed, patient examined, no change in status, stable for surgery.  I have reviewed the patient's chart and labs.  Questions were answered to the patient's satisfaction.   ? ? ?[place] ? ? ?

## 2021-08-18 NOTE — Op Note (Signed)
Robotic assisted laparoscopic Nissen fundoplication w repair of  paraesophageal hernia with Bio-A Mesh 10 x 7 cms ? ?Pre-operative Diagnosis: GERD, type III paraesophageal  hernia ? ?Post-operative Diagnosis: same ? ?Procedure:  ?Robotic assisted laparoscopic Nissen fundoplication w repair of  paraesophageal hernia with Bio-A Mesh ? ?Surgeon: Caroleen Hamman, MD FACS ? ?Assistant: Waylan Boga RNFA . Required due to the complexity of the case the need for exposure and lack of first assist. ? ?Anesthesia: Gen. with endotracheal tube ? ?Findings: ?Paraesophageal hernia type III ?Loose wrap 360 degree over 50 FR Bougie ? ? ?Estimated Blood Loss: 15cc ?      ?Specimens: Hernia sac ?      ?Complications: none ? ? ?Procedure Details  ?The patient was seen again in the Holding Room. The benefits, complications, treatment options, and expected outcomes were discussed with the patient. The risks of bleeding, infection, recurrence of symptoms, failure to resolve symptoms,  esophageal damage, Dysphagia, bowel injury, any of which could require further surgery were reviewed with the patient. The likelihood of improving the patient's symptoms with return to their baseline status is good.  The patient and/or family concurred with the proposed plan, giving informed consent.  The patient was taken to Operating Room, identified  and the procedure verified. ? A Time Out was held and the above information confirmed. ? ?Prior to the induction of general anesthesia, antibiotic prophylaxis was administered. VTE prophylaxis was in place. General endotracheal anesthesia was then administered and tolerated well. After the induction, the abdomen was prepped with Chloraprep and draped in the sterile fashion. The patient was positioned in the supine position. ? ?Cut down technique was used to enter the abdominal cavity and a Hasson trochar was placed after two vicryl stitches were anchored to the fascia. Pneumoperitoneum was then created with CO2  and tolerated well without any adverse changes in the patient's vital signs.  Three 8-mm ports were placed under direct vision. All skin incisions  were infiltrated with a local anesthetic agent before making the incision and placing the trocars. An additional 5 mm regular laparoscopic port was placed to assist with retraction and exposure.  ? ?The patient was positioned  in reverse Trendelenburg, robot was brought to the surgical field and docked in the standard fashion.  We made sure all the instrumentation was kept indirect view at all times and that there were no collision between the arms. ?I scrubbed out and went to the console. ? ?I used robotic arm to retract the liver, the vessel sealer on my right hand and a forced bipolar grasper on my left hand.  There is along the extra 5 mm port allow me ample exposure and the ability to perform meticulous dissection ? ?We Started dividing the lesser omentum via the pars flaccida.  We Were able to dissect the lesser curvature of the stomach and  dissected the fundus free from the right and left crus.  We circumferentially dissected the GE junction.  The hernia sac was also completely reduced and we were able to bring the stomach into the intra-abdominal position.  Attention then was turned to the greater curvature where the short gastrics were divided with sealer device.  We were able to identify the left crus and again were able to make sure there was a good circumferential dissection and that the hernia sac was completely excised.  We did perform also a good dissection within the mediastinum to allow a complete reduction of the sac and a to completely allow  an intra-abdominal Nissen fundoplication. GE junction was completely intra-abdominal. ?Two strips of bioa were used as pledget and the crus was re-approximated with a 2-0V lock suture . Mesh palced and secured w Vistaseal. ? ?We Asked anesthesia to place a 50 French bougie and this went easily.  We also observe  trajectory of the bougie. ?360 degree Nissen fundoplication was created with multiple 2-0 silk sutures and we placed 3 stitches taking some of the esophagus within that bite.  The fundoplication measured approximately 3-1/2 cm and he was floppy. ?I was very happy with the way the fundoplication laid and the repair of the hernia. ? ?Inspection of the  upper quadrant was performed. No bleeding, bile  Or esophageal injuries leaks, or bowel injuries were noted. ?Robotic instruments and robotic arms were undocked in the standard fashion. All the needles were removed under direct visualization.   ?I scrubbed back in. ? ?Pneumoperitoneum was released.  The periumbilical port site was closed with interrumpted 0 Vicryl sutures. 4-0 subcuticular Monocryl was used to close the skin. Liposomal marcaine was injected to all the incisions sites. ? ?Dermabond was  applied.  The patient was then extubated and brought to the recovery room in stable condition. Sponge, lap, and needle counts were correct at closure and at the conclusion of the case.  ?        ?     ?Caroleen Hamman, MD, FACS  ?

## 2021-08-18 NOTE — Anesthesia Preprocedure Evaluation (Signed)
Anesthesia Evaluation  ?Patient identified by MRN, date of birth, ID band ?Patient awake ? ? ? ?Reviewed: ?Allergy & Precautions, NPO status , Patient's Chart, lab work & pertinent test results ? ?History of Anesthesia Complications ?Negative for: history of anesthetic complications ? ?Airway ?Mallampati: III ? ?TM Distance: <3 FB ?Neck ROM: full ? ? ? Dental ? ?(+) Chipped, Poor Dentition, Missing ?  ?Pulmonary ?neg shortness of breath, sleep apnea ,  ?  ?Pulmonary exam normal ? ? ? ? ? ? ? Cardiovascular ?Exercise Tolerance: Good ?(-) angina(-) Past MI Normal cardiovascular exam ? ? ?  ?Neuro/Psych ? Headaches, PSYCHIATRIC DISORDERS   ? GI/Hepatic ?Neg liver ROS, hiatal hernia, GERD  Controlled,(+) Hepatitis -  ?Endo/Other  ?diabetes, Type 2Hypothyroidism  ? Renal/GU ?  ? ?  ?Musculoskeletal ? ? Abdominal ?  ?Peds ? Hematology ?negative hematology ROS ?(+)   ?Anesthesia Other Findings ?Past Medical History: ?No date: Anemia ?03/2017: Ankle sprain ?No date: Anxiety ?No date: Arthritis ?2018: Breast cancer ([place]) ?03/2017: Breast cancer ([place]) ?No date: Depression ?No date: Diabetes mellitus without complication ([place]) ?06/7251: Fracture of distal end of radius ?    Comment:  left ?05/04/2017: Genetic testing ?    Comment:  Multi-Cancer panel (83 genes) @ [facility] - No pathogenic  ?             mutations detected ?No date: GERD (gastroesophageal reflux disease) ?No date: Headache ?No date: History of hiatal hernia ?No date: Hyperlipidemia ?No date: Hypothyroidism ?No date: Personal history of radiation therapy ?No date: Pneumonia ?03/2017: Pre-diabetes ?    Comment:  follows healthy diabetic diet ?No date: Sleep apnea ?    Comment:  has not used cpap in a long time ?No date: Vitamin B 12 deficiency ? ?Past Surgical History: ?1983: ABDOMINAL HYSTERECTOMY ?1977: APPENDECTOMY ?    Comment:  when she had c-section ?03/27/2017: BREAST BIOPSY; Left ?    Comment:  INVASIVE MAMMARY CARCINOMA  Grade 1 UIG ?04/13/2017: BREAST LUMPECTOMY; Left ?    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    Comment:  Procedure: COLONOSCOPY WITH PROPOFOL;  Surgeon: Marius Ditch,  ?             Tally Due, MD;  Location: [facility] END[facility]COPY;  Service:  ?             Gastroenterology;  Laterality: N/A; ?No date: DILATION AND CURETTAGE OF UTERUS ?02/05/2015: ESOPHAGOGASTRODUODENOSCOPY (EGD) WITH PROPOFOL; N/A ?    Comment:  Procedure: ESOPHAGOGASTRODUODENOSCOPY (EGD) WITH  ?             PROPOFOL;  Surgeon: Josefine Class, MD;  Location:  ?             [facility];  Service: Endoscopy;  Laterality: N/A; ?02/25/2020: ESOPHAGOGASTRODUODENOSCOPY (EGD) WITH PROPOFOL; N/A ?    Comment:  Procedure: ESOPHAGOGASTRODUODENOSCOPY (EGD) WITH  ?             PROPOFOL;  Surgeon: [name], MD;  Location:  ?             [facility];  Service: Gastroenterology;  Laterality:  ?             N/A; ?04/13/2017: PARTIAL MASTECTOMY WITH NEEDLE LOCALIZATION; Left ?    Comment:  Procedure: PARTIAL MASTECTOMY WITH NEEDLE LOCALIZATION;  ?             Surgeon: Tamala Julian,  Hillery Aldo, MD;  Location: [facility] ORS;   ?             Service: General;  Laterality: Left; ?1991: PATELLA FRACTURE SURGERY; Left ?    Comment:  screws in place ?02/05/2015: SAVORY DILATION; N/A ?    Comment:  Procedure: SAVORY DILATION;  Surgeon: Grace Blight  ?             Rayann Heman, MD;  Location: [facility];  Service: Endoscopy; ?             Laterality: N/A; ?04/13/2017: SENTINEL NODE BIOPSY; Left ?    Comment:  Procedure: SENTINEL NODE BIOPSY;  Surgeon: Leodis Binet ?             Lavone Neri, MD;  Location: [facility];  Service: General;   ?             Laterality: Left; ?No date: SINUS SURGERY WITH INSTATRAK ?    Comment:  x 2 ? ?BMI   ? Body Mass Index: 27.62 kg/m?  ?  ? ? Reproductive/Obstetrics ?negative OB ROS ? ?  ? ? ? ? ? ? ? ? ? ? ? ? ? ?  ?   ? ? ? ? ? ? ? ? ?Anesthesia Physical ?Anesthesia Plan ? ?ASA: 3 ? ?Anesthesia Plan: General ETT  ? ?Post-op Pain Management:   ? ?Induction: Intravenous ? ?PONV Risk Score and Plan: Ondansetron, Dexamethasone, Midazolam and Treatment may vary due to age or medical condition ? ?Airway Management Planned: Oral ETT ? ?Additional Equipment:  ? ?Intra-op Plan:  ? ?Post-operative Plan: Extubation in OR ? ?Informed Consent: I have reviewed the patients History and Physical, chart, labs and discussed the procedure including the risks, benefits and alternatives for the proposed anesthesia with the patient or authorized representative who has indicated his/her understanding and acceptance.  ? ? ? ?Dental Advisory Given ? ?Plan Discussed with: Anesthesiologist, CRNA and Surgeon ? ?Anesthesia Plan Comments: (Patient consented for risks of anesthesia including but not limited to:  ?- adverse reactions to medications ?- damage to eyes, teeth, lips or other oral mucosa ?- nerve damage due to positioning  ?- sore throat or hoarseness ?- Damage to heart, brain, nerves, lungs, other parts of body or loss of life ? ?Patient voiced understanding.)  ? ? ? ? ? ? ?Anesthesia Quick Evaluation ? ?

## 2021-08-19 ENCOUNTER — Encounter: Payer: Self-pay | Admitting: Surgery

## 2021-08-19 DIAGNOSIS — K449 Diaphragmatic hernia without obstruction or gangrene: Secondary | ICD-10-CM | POA: Diagnosis not present

## 2021-08-19 LAB — CBC
HCT: 37.5 % (ref 36.0–46.0)
Hemoglobin: 11.8 g/dL — ABNORMAL LOW (ref 12.0–15.0)
MCH: 27 pg (ref 26.0–34.0)
MCHC: 31.5 g/dL (ref 30.0–36.0)
MCV: 85.8 fL (ref 80.0–100.0)
Platelets: 228 10*3/uL (ref 150–400)
RBC: 4.37 MIL/uL (ref 3.87–5.11)
RDW: 14.3 % (ref 11.5–15.5)
WBC: 13 10*3/uL — ABNORMAL HIGH (ref 4.0–10.5)
nRBC: 0 % (ref 0.0–0.2)

## 2021-08-19 LAB — BASIC METABOLIC PANEL
Anion gap: 7 (ref 5–15)
BUN: 15 mg/dL (ref 8–23)
CO2: 24 mmol/L (ref 22–32)
Calcium: 8 mg/dL — ABNORMAL LOW (ref 8.9–10.3)
Chloride: 106 mmol/L (ref 98–111)
Creatinine, Ser: 0.75 mg/dL (ref 0.44–1.00)
GFR, Estimated: >60 mL/min (ref >60)
Glucose, Bld: 121 mg/dL — ABNORMAL HIGH (ref 70–99)
Potassium: 4.2 mmol/L (ref 3.5–5.1)
Sodium: 137 mmol/L (ref 135–145)

## 2021-08-19 LAB — GLUCOSE, CAPILLARY: Glucose-Capillary: 99 mg/dL (ref 70–99)

## 2021-08-19 LAB — SURGICAL PATHOLOGY

## 2021-08-19 MED ORDER — INSULIN ASPART 100 UNIT/ML IJ SOLN
INTRAMUSCULAR | Status: AC
  Filled 2021-08-19: qty 1

## 2021-08-19 MED ORDER — ENOXAPARIN SODIUM 40 MG/0.4ML IJ SOSY
PREFILLED_SYRINGE | INTRAMUSCULAR | Status: AC
  Filled 2021-08-19: qty 0.4

## 2021-08-19 MED ORDER — ACETAMINOPHEN 500 MG PO TABS
ORAL_TABLET | ORAL | Status: AC
Start: 2021-08-19 — End: 2021-08-19
  Filled 2021-08-19: qty 2

## 2021-08-19 MED ORDER — OXYCODONE HCL 5 MG PO TABS
ORAL_TABLET | ORAL | Status: AC
  Filled 2021-08-19: qty 2

## 2021-08-19 MED ORDER — METHOCARBAMOL 500 MG PO TABS
500.0000 mg | ORAL_TABLET | Freq: Three times a day (TID) | ORAL | 0 refills | Status: DC | PRN
Start: 2021-08-19 — End: 2021-12-05

## 2021-08-19 MED ORDER — ONDANSETRON 4 MG PO TBDP
4.0000 mg | ORAL_TABLET | Freq: Four times a day (QID) | ORAL | 0 refills | Status: AC | PRN
Start: 2021-08-19 — End: ?

## 2021-08-19 MED ORDER — OXYCODONE HCL 5 MG PO TABS: 5.0000 mg | ORAL_TABLET | Freq: Four times a day (QID) | ORAL | 0 refills | Status: DC | PRN

## 2021-08-19 NOTE — Discharge Summary (Signed)
[facility] ?SURGICAL DISCHARGE SUMMARY ? ?Patient ID: ?April Leblanc ?MRN: [id number] ?DOB/AGE: 1950/10/28 71 y.o. ? ?Admit date: 08/18/2021 ?Discharge date: 08/19/2021 ? ?Discharge Diagnoses ?Patient Active Problem List  ? Diagnosis Date Noted  ? S/P repair of paraesophageal hernia 08/18/2021  ? ? ?Consultants ?None ? ?Procedures ?08/18/2021 ? ?HPI: April Leblanc is a 71 y.o. female with a history of paraesophageal hernia who presents to [facility] on 04/20 for scheduled repair with Dr Dahlia Byes.  ? ?Hospital Course: Informed consent was obtained and documented, and patient underwent uneventful robotic assisted laparoscopic paraesophageal hernia repair and Nissen fundoplication (Dr Dahlia Byes, 26/33/3545).  Post-operatively, patient did well. Advancement of patient's diet and ambulation were well-tolerated. The remainder of patient's hospital course was essentially unremarkable, and discharge planning was initiated accordingly with patient safely able to be discharged home with appropriate discharge instructions, pain control, and outpatient follow-up after all of her questions were answered to her expressed satisfaction.  ? ?Discharge Condition: Good ? ? ?Physical Examination:  ?Constitutional: Well appearing female, NAD ?Pulmonary: Normal effort, no respiratory distress ?Gastrointestinal: Soft, minimal incisional soreness, non-distended, no rebound/guarding ?Skin: Laparoscopic incisions are CDI with dermabond, no erythema ? ? ?Allergies as of 08/19/2021   ? ?   Reactions  ? Amoxicillin Hives, Itching, Other (See Comments)  ? Has patient had a PCN reaction causing immediate rash, facial/tongue/throat swelling, SOB or lightheadedness with hypotension: No ?Has patient had a PCN reaction causing severe rash involving mucus membranes or skin necrosis: No ?Has patient had a PCN reaction that required hospitalization: No ?Has patient had a PCN reaction occurring within the last 10 years: No ?If all of the above  answers are "NO", then may proceed with Cephalosporin use.  ? Erythromycin Hives, Itching  ? Sulfa Antibiotics Hives, Itching  ? Contrast Media [iodinated Contrast Media] Rash  ? Red rash and itching. Topical iodine not a problem.  ? Morphine Nausea And Vomiting, Other (See Comments)  ? Pretty severe vomiting. Can take hydrocodone and oxycodone  ? ?  ? ?  ?Medication List  ?  ? ?STOP taking these medications   ? ?esomeprazole 40 MG capsule ?Commonly known as: [place] ?  ? ?  ? ?TAKE these medications   ? ?acetaminophen 500 MG tablet ?Commonly known as: TYLENOL ?Take 1,000 mg by mouth every 6 (six) hours as needed for moderate pain or headache. ?  ?amphetamine-dextroamphetamine 10 MG 24 hr capsule ?Commonly known as: ADDERALL XR ?Take 10 mg by mouth daily. ?  ?anastrozole 1 MG tablet ?Commonly known as: ARIMIDEX ?TAKE 1 TABLET BY MOUTH EVERY DAY ?  ?ARIPiprazole 15 MG tablet ?Commonly known as: ABILIFY ?Take 15 mg by mouth daily. ?  ?atorvastatin 40 MG tablet ?Commonly known as: LIPITOR ?Take 40 mg by mouth daily. ?  ?calcium carbonate 1500 (600 Ca) MG Tabs tablet ?Commonly known as: OSCAL ?Take by mouth 2 (two) times daily with a meal. ?  ?fluticasone 50 MCG/ACT nasal spray ?Commonly known as: FLONASE ?Place 2 sprays into both nostrils daily as needed for allergies. ?  ?levocetirizine 5 MG tablet ?Commonly known as: XYZAL ?Take 5 mg by mouth every evening. ?  ?levothyroxine 150 MCG tablet ?Commonly known as: SYNTHROID ?Take 150 mcg by mouth daily before breakfast. ?  ?LORazepam 0.5 MG tablet ?Commonly known as: ATIVAN ?Take 0.5 mg by mouth 3 (three) times daily. ?  ?metFORMIN 500 MG 24 hr tablet ?Commonly known as: GLUCOPHAGE-XR ?Take 1,000 mg by mouth 2 (two) times daily. ?  ?methocarbamol  500 MG tablet ?Commonly known as: ROBAXIN ?Take 1 tablet (500 mg total) by mouth every 8 (eight) hours as needed for muscle spasms. ?  ?mupirocin ointment 2 % ?Commonly known as: BACTROBAN ?Apply 1 application topically 3 (three)  times daily as needed. ?  ?ondansetron 4 MG disintegrating tablet ?Commonly known as: ZOFRAN-ODT ?Take 1 tablet (4 mg total) by mouth every 6 (six) hours as needed for nausea. ?  ?oxybutynin 5 MG 24 hr tablet ?Commonly known as: DITROPAN-XL ?Take 1 tablet by mouth daily. ?  ?oxyCODONE 5 MG immediate release tablet ?Commonly known as: Oxy IR/ROXICODONE ?Take 1 tablet (5 mg total) by mouth every 6 (six) hours as needed for severe pain or breakthrough pain. ?  ?traZODone 150 MG tablet ?Commonly known as: DESYREL ?Take 150 mg by mouth at bedtime. ?  ?vitamin B-12 1000 MCG tablet ?Commonly known as: CYANOCOBALAMIN ?Take 1,000 mcg by mouth daily. ?  ?vortioxetine HBr 20 MG Tabs tablet ?Commonly known as: TRINTELLIX ?Take 20 mg by mouth daily. ?  ? ?  ? ? ? ? Follow-up Information   ? ? Jules Husbands, MD. Daphane Shepherd on 09/05/2021.   ?Specialty: General Surgery ?Why: go to appointment with Dr Dahlia Byes at 930am on 05/08.....s/p laparoscopic paraesophageal hernia repair and Nissen Fundoplication ?Contact information: ?[address] ?[address] ?[place] [address] ?[phone] ? ? ?  ?  ? ?  ?  ? ?  ? ? ? ?Time spent on discharge management including discussion of hospital course, clinical condition, outpatient instructions, prescriptions, and follow up with the patient and members of the medical team: >30 minutes ? ?-- ?Edison Simon , PA-C ?[facility]  ?08/19/2021, 8:15 AM ?[phone] ?M-F: 7am - 4pm ? ?

## 2021-08-25 ENCOUNTER — Encounter: Payer: Self-pay | Admitting: Radiation Oncology

## 2021-08-25 ENCOUNTER — Ambulatory Visit
Admission: RE | Admit: 2021-08-25 | Discharge: 2021-08-25 | Disposition: A | Discharge status: Home / Self Care | Payer: Medicare Other | Source: Ambulatory Visit | Attending: Radiation Oncology | Admitting: Radiation Oncology

## 2021-08-25 VITALS — BP 128/77 | HR 97 | Temp 97.0°F | Ht 67.5 in | Wt 177.5 lb

## 2021-08-25 DIAGNOSIS — Z923 Personal history of irradiation: Secondary | ICD-10-CM | POA: Diagnosis not present

## 2021-08-25 DIAGNOSIS — C50212 Malignant neoplasm of upper-inner quadrant of left female breast: Secondary | ICD-10-CM | POA: Insufficient documentation

## 2021-08-25 DIAGNOSIS — Z170 Estrogen receptor positive status [ER+]: Secondary | ICD-10-CM | POA: Diagnosis not present

## 2021-08-25 DIAGNOSIS — Z79811 Long term (current) use of aromatase inhibitors: Secondary | ICD-10-CM | POA: Insufficient documentation

## 2021-08-25 NOTE — Progress Notes (Signed)
Radiation Oncology ?Follow up Note ? ?Name: April Leblanc   ?Date:   08/25/2021 ?MRN:  [id number] ?DOB: 04-01-1951  ? ? ?This 71 y.o. female presents to the clinic today for 4-year follow-up status post whole breast radiation to her left breast for stage I ER/PR positive invasive mammary carcinoma. ? ?REFERRING PROVIDER: Gustavo Lah, MD ? ?HPI: Patient is a 71 year old female now out for years having pleated whole breast radiation to her left breast for stage I ER/PR positive invasive mammary carcinoma.  Seen today in routine follow-up she is doing well.  She specifically denies breast tenderness cough or bone pain..  She had mammograms this month with which I have reviewed were BI-RADS 1 negative.  She is currently on Arimidex tolerating it well without side effect. ? ?COMPLICATIONS OF TREATMENT: none ? ?FOLLOW UP COMPLIANCE: keeps appointments  ? ?PHYSICAL EXAM:  ?BP 128/77   Pulse 97   Temp (!) 97 ?F (36.1 ?C)   Ht 5' 7.5" (1.715 m)   Wt 177 lb 8 oz (80.5 kg)   BMI 27.39 kg/m?  ?Lungs are clear to A&P cardiac examination essentially unremarkable with regular rate and rhythm. No dominant mass or nodularity is noted in either breast in 2 positions examined. Incision is well-healed. No axillary or supraclavicular adenopathy is appreciated. Cosmetic result is excellent.  Well-developed well-nourished patient in NAD. HEENT reveals PERLA, EOMI, discs not visualized.  Oral cavity is clear. No oral mucosal lesions are identified. Neck is clear without evidence of cervical or supraclavicular adenopathy. Lungs are clear to A&P. Cardiac examination is essentially unremarkable with regular rate and rhythm without murmur rub or thrill. Abdomen is benign with no organomegaly or masses noted. Motor sensory and DTR levels are equal and symmetric in the upper and lower extremities. Cranial nerves II through XII are grossly intact. Proprioception is intact. No peripheral adenopathy or edema is identified. No motor or  sensory levels are noted. Crude visual fields are within normal range. ? ?RADIOLOGY RESULTS: Mammograms reviewed compatible with above-stated findings ? ?PLAN: Present time patient is now out over 4 years with no evidence of disease for her stage I ER/PR positive invasive mammary carcinoma.  I am going to discontinue follow-up care.  She continues follow-up care with her surgeon as well as medical oncology.  We will be happy to reevaluate the patient anytime should further consultation be indicated. ? ?I would like to take this opportunity to thank you for allowing me to participate in the care of your patient.. ?  ? Noreene Filbert, MD ? ?

## 2021-09-05 ENCOUNTER — Encounter: Payer: Self-pay | Admitting: Surgery

## 2021-09-05 ENCOUNTER — Ambulatory Visit (INDEPENDENT_AMBULATORY_CARE_PROVIDER_SITE_OTHER): Payer: Medicare Other | Admitting: Surgery

## 2021-09-05 ENCOUNTER — Encounter: Payer: Medicare Other | Admitting: Surgery

## 2021-09-05 VITALS — BP 126/77 | HR 80 | Temp 98.6°F | Wt 172.6 lb

## 2021-09-05 DIAGNOSIS — Z09 Encounter for follow-up examination after completed treatment for conditions other than malignant neoplasm: Secondary | ICD-10-CM

## 2021-09-05 DIAGNOSIS — K449 Diaphragmatic hernia without obstruction or gangrene: Secondary | ICD-10-CM

## 2021-09-05 NOTE — Patient Instructions (Addendum)
If you have any concerns or questions, please feel free to call our office. Follow up in 3 months.  ? ?No carbonated drinks or bread for another couple weeks.  ? ?Hiatal Hernia ? ?A hiatal hernia occurs when part of the stomach slides above the muscle that separates the abdomen from the chest (diaphragm). A person can be born with a hiatal hernia (congenital), or it may develop over time. In almost all cases of hiatal hernia, only the top part of the stomach pushes through the diaphragm. ?Many people have a hiatal hernia with no symptoms. The larger the hernia, the more likely it is that you will have symptoms. In some cases, a hiatal hernia allows stomach acid to flow back into the tube that carries food from your mouth to your stomach (esophagus). This may cause heartburn symptoms. Severe heartburn symptoms may mean that you have developed a condition called gastroesophageal reflux disease (GERD). ?What are the causes? ?This condition is caused by a weakness in the opening (hiatus) where the esophagus passes through the diaphragm to attach to the upper part of the stomach. A person may be born with a weakness in the hiatus, or a weakness can develop over time. ?What increases the risk? ?This condition is more likely to develop in: ?Older people. Age is a major risk factor for a hiatal hernia, especially if you are over the age of 24. ?Pregnant women. ?People who are overweight. ?People who have frequent constipation. ?What are the signs or symptoms? ?Symptoms of this condition usually develop in the form of GERD symptoms. Symptoms include: ?Heartburn. ?Belching. ?Indigestion. ?Trouble swallowing. ?Coughing or wheezing. ?Sore throat. ?Hoarseness. ?Chest pain. ?Nausea and vomiting. ?How is this diagnosed? ?This condition may be diagnosed during testing for GERD. Tests that may be done include: ?X-rays of your stomach or chest. ?An upper gastrointestinal (GI) series. This is an X-ray exam of your GI tract that is  taken after you swallow a chalky liquid that shows up clearly on the X-ray. ?Endoscopy. This is a procedure to look into your stomach using a thin, flexible tube that has a tiny camera and light on the end of it. ?How is this treated? ?This condition may be treated by: ?Dietary and lifestyle changes to help reduce GERD symptoms. ?Medicines. These may include: ?Over-the-counter antacids. ?Medicines that make your stomach empty more quickly. ?Medicines that block the production of stomach acid (H2 blockers). ?Stronger medicines to reduce stomach acid (proton pump inhibitors). ?Surgery to repair the hernia, if other treatments are not helping. ?If you have no symptoms, you may not need treatment. ?Follow these instructions at home: ?Lifestyle and activity ?Do not use any products that contain nicotine or tobacco, such as cigarettes and e-cigarettes. If you need help quitting, ask your health care provider. ?Try to achieve and maintain a healthy body weight. ?Avoid putting pressure on your abdomen. Anything that puts pressure on your abdomen increases the amount of acid that may be pushed up into your esophagus. ?Avoid bending over, especially after eating. ?Raise the head of your bed by putting blocks under the legs. This keeps your head and esophagus higher than your stomach. ?Do not wear tight clothing around your chest or stomach. ?Try not to strain when having a bowel movement, when urinating, or when lifting heavy objects. ?Eating and drinking ?Avoid foods that can worsen GERD symptoms. These may include: ?Fatty foods, like fried foods. ?Citrus fruits, like oranges or lemon. ?Other foods and drinks that contain acid, like orange juice  or tomatoes. ?Spicy food. ?Chocolate. ?Eat frequent small meals instead of three large meals a day. This helps prevent your stomach from getting too full. ?Eat slowly. ?Do not lie down right after eating. ?Do not eat 1-2 hours before bed. ?Do not drink beverages with caffeine. These  include cola, coffee, cocoa, and tea. ?Do not drink alcohol. ?General instructions ?Take over-the-counter and prescription medicines only as told by your health care provider. ?Keep all follow-up visits as told by your health care provider. This is important. ?Contact a health care provider if: ?Your symptoms are not controlled with medicines or lifestyle changes. ?You are having trouble swallowing. ?You have coughing or wheezing that will not go away. ?Get help right away if: ?Your pain is getting worse. ?Your pain spreads to your arms, neck, jaw, teeth, or back. ?You have shortness of breath. ?You sweat for no reason. ?You feel sick to your stomach (nauseous) or you vomit. ?You vomit blood. ?You have bright red blood in your stools. ?You have black, tarry stools. ?Summary ?A hiatal hernia occurs when part of the stomach slides above the muscle that separates the abdomen from the chest (diaphragm). ?A person may be born with a weakness in the hiatus, or a weakness can develop over time. ?Symptoms of hiatal hernia may include heartburn, trouble swallowing, or sore throat. ?Management of hiatal hernia includes eating frequent small meals instead of three large meals a day. ?Get help right away if you vomit blood, have bright red blood in your stools, or have black, tarry stools. ?This information is not intended to replace advice given to you by your health care provider. Make sure you discuss any questions you have with your health care provider. ?Document Revised: 03/01/2021 Document Reviewed: 03/18/2020 ?Elsevier Patient Education ? [place]. ? ?

## 2021-09-07 ENCOUNTER — Other Ambulatory Visit: Payer: Self-pay | Admitting: Oncology

## 2021-09-07 NOTE — Progress Notes (Signed)
71 year old female status post paraesophageal hernia repair robotically on 4/20 ?Doing very well.  She is following diet no dysphagia ?No reflux ?She Can tell a significant difference for the good ? ?PE NAD ?ABd: soft, nt , incisions c/d/I ? ?A/p Doing very well without complications.  Continue Nissen diet.  Return to clinic in 2  months or so ?

## 2021-10-20 ENCOUNTER — Encounter: Payer: Self-pay | Admitting: Gastroenterology

## 2021-10-20 ENCOUNTER — Other Ambulatory Visit: Payer: Self-pay

## 2021-10-20 ENCOUNTER — Ambulatory Visit (INDEPENDENT_AMBULATORY_CARE_PROVIDER_SITE_OTHER): Payer: Medicare Other | Admitting: Gastroenterology

## 2021-10-20 VITALS — BP 134/78 | HR 70 | Temp 97.5°F | Ht 67.5 in | Wt 168.1 lb

## 2021-10-20 DIAGNOSIS — K529 Noninfective gastroenteritis and colitis, unspecified: Secondary | ICD-10-CM

## 2021-10-23 LAB — CELIAC DISEASE PANEL
Endomysial IgA: NEGATIVE
IgA/Immunoglobulin A, Serum: 147 mg/dL (ref 87–352)
Transglutaminase IgA: <2 U/mL (ref 0–3)

## 2021-10-25 ENCOUNTER — Telehealth: Payer: Self-pay

## 2021-10-26 ENCOUNTER — Telehealth: Payer: Self-pay

## 2021-10-26 DIAGNOSIS — K8689 Other specified diseases of pancreas: Secondary | ICD-10-CM

## 2021-10-26 MED ORDER — PANCRELIPASE (LIP-PROT-AMYL) 36000-114000 UNITS PO CPEP: ORAL_CAPSULE | ORAL | 3 refills | Status: DC

## 2021-10-26 NOTE — Telephone Encounter (Signed)
Patient verbalized understanding of results. She is okay with getting Ct scan and with medication. Sent medication to the pharmacy and called central scheduling to schedule CT scan. Patient is schedule for CT scan on 0705/20223 at 1:30pm  at the out patient imaging Nothing to eat or drink 4 hours before the scan. Pick up oral contrast before the scan . Called and informed patient of the scan.

## 2021-10-26 NOTE — Telephone Encounter (Signed)
-----   Message from [name], MD sent at 10/26/2021  9:26 AM EDT ----- Pancreatic fecal elastase levels are low, confirms severe pancreatic insufficiency.  Recommend to start Creon or Zenpep 2 capsules with each meal and 1 with snack Recommend CT abdomen and pelvis pancreas protocol  I will see her during follow-up appointment in August  Rohini Vanga

## 2021-10-27 LAB — CALPROTECTIN, FECAL: Calprotectin, Fecal: 15 ug/g (ref 0–120)

## 2021-10-27 LAB — PANCREATIC ELASTASE, FECAL: Pancreatic Elastase, Fecal: 82 ug Elast./g — ABNORMAL LOW (ref >200)

## 2021-11-02 ENCOUNTER — Inpatient Hospital Stay: Admission: RE | Admit: 2021-11-02 | Payer: Medicare Other | Source: Ambulatory Visit

## 2021-11-07 ENCOUNTER — Telehealth: Payer: Self-pay

## 2021-11-07 MED ORDER — PREDNISONE 50 MG PO TABS: ORAL_TABLET | ORAL | 0 refills | Status: DC

## 2021-11-07 MED ORDER — DIPHENHYDRAMINE HCL 50 MG PO CAPS: ORAL_CAPSULE | ORAL | 0 refills | Status: DC

## 2021-11-07 NOTE — Telephone Encounter (Signed)
Per Dr. Marius Ditch it is '50mg'$  prednisone 13 hours, 7 hours and 1 hour prior to the scan and '50mg'$  benadryl 1 hour prior to the scan. She wants to call this in for pre medication before CT scan. Called patient and patient verbalized understanding

## 2021-11-08 ENCOUNTER — Ambulatory Visit
Admission: RE | Admit: 2021-11-08 | Discharge: 2021-11-08 | Disposition: A | Discharge status: Home / Self Care | Payer: Medicare Other | Source: Ambulatory Visit | Attending: Gastroenterology | Admitting: Gastroenterology

## 2021-11-08 ENCOUNTER — Inpatient Hospital Stay: Payer: Medicare Other | Attending: Oncology | Admitting: Oncology

## 2021-11-08 ENCOUNTER — Encounter: Payer: Self-pay | Admitting: Oncology

## 2021-11-08 VITALS — BP 132/80 | HR 68 | Resp 16 | Wt 169.0 lb

## 2021-11-08 DIAGNOSIS — R197 Diarrhea, unspecified: Secondary | ICD-10-CM | POA: Insufficient documentation

## 2021-11-08 DIAGNOSIS — M8580 Other specified disorders of bone density and structure, unspecified site: Secondary | ICD-10-CM | POA: Diagnosis not present

## 2021-11-08 DIAGNOSIS — Z79899 Other long term (current) drug therapy: Secondary | ICD-10-CM | POA: Diagnosis not present

## 2021-11-08 DIAGNOSIS — K8689 Other specified diseases of pancreas: Secondary | ICD-10-CM | POA: Diagnosis present

## 2021-11-08 DIAGNOSIS — Z170 Estrogen receptor positive status [ER+]: Secondary | ICD-10-CM | POA: Diagnosis not present

## 2021-11-08 DIAGNOSIS — C50412 Malignant neoplasm of upper-outer quadrant of left female breast: Secondary | ICD-10-CM | POA: Diagnosis present

## 2021-11-08 DIAGNOSIS — Z923 Personal history of irradiation: Secondary | ICD-10-CM | POA: Diagnosis not present

## 2021-11-08 DIAGNOSIS — Z5181 Encounter for therapeutic drug level monitoring: Secondary | ICD-10-CM

## 2021-11-08 DIAGNOSIS — Z79811 Long term (current) use of aromatase inhibitors: Secondary | ICD-10-CM | POA: Diagnosis not present

## 2021-11-08 MED ORDER — IOHEXOL 300 MG/ML  SOLN
100.0000 mL | Freq: Once | INTRAMUSCULAR | Status: AC | PRN
  Administered 2021-11-08: 100 mL via INTRAVENOUS

## 2021-11-08 NOTE — Progress Notes (Signed)
Pt states she has been dealing with chronic diarrhea since 08/21/21 which is making her feel very weak and fatigued; being followed by GI had a Ct this morning. Started on CREON back in April.

## 2021-11-10 ENCOUNTER — Encounter: Payer: Self-pay | Admitting: Oncology

## 2021-11-10 NOTE — Progress Notes (Signed)
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  6 mm tumor with negative lymph nodes that was ER/PR positive and negative.  Patient did not adjuvant chemotherapy and went on to receive adjuvant radiation treatment and started taking Arimidex in march 2019. She gets reclast for osteopenia Q18 months    Interval history-patient has had ongoing diarrhea for the last 2 months and has been following up with GI.  Cause of diarrhea so far has not been clear.  Otherwise tolerating Arimidex well without any significant side effects.  ECOG PS- 1 Pain scale- 0   Review of systems- Review of Systems  Constitutional:  Positive for malaise/fatigue. Negative for chills, fever and weight loss.  HENT:  Negative for congestion, ear discharge and nosebleeds.   Eyes:  Negative for blurred vision.  Respiratory:  Negative for cough, hemoptysis, sputum production, shortness of breath and wheezing.   Cardiovascular:  Negative for chest pain, palpitations, orthopnea and claudication.  Gastrointestinal:  Positive for diarrhea. Negative for abdominal pain, blood in stool, constipation, heartburn, melena, nausea and vomiting.  Genitourinary:  Negative for dysuria, flank pain, frequency, hematuria and urgency.   Musculoskeletal:  Negative for back pain, joint pain and myalgias.  Skin:  Negative for rash.  Neurological:  Negative for dizziness, tingling, focal weakness, seizures, weakness and headaches.  Endo/Heme/Allergies:  Does not bruise/bleed easily.  Psychiatric/Behavioral:  Negative for depression and suicidal ideas. The patient does not have insomnia.       Allergies  Allergen Reactions   Amoxicillin Hives, Itching and Other (See Comments)    Has patient had a PCN reaction causing immediate rash, facial/tongue/throat swelling, SOB or lightheadedness with hypotension: No Has patient had a PCN reaction causing severe rash involving mucus membranes or skin necrosis: No Has patient had a PCN reaction that required hospitalization: No Has patient had a PCN reaction occurring within the last 10 years: No If all of the above answers are "NO", then may proceed with Cephalosporin use.    Erythromycin Hives and Itching   Sulfa Antibiotics Hives and Itching   Contrast Media [Iodinated Contrast Media] Rash    Red rash and itching. Topical iodine not a problem.   Morphine Nausea And Vomiting and Other (See Comments)    Pretty severe vomiting. Can take hydrocodone and oxycodone       Past Medical History:  Diagnosis Date   Anemia    Ankle sprain 03/2017   Anxiety    Arthritis    Breast cancer ([place]) 2018   Breast cancer ([place]) 03/2017   Depression    Diabetes mellitus without complication ([place])    Fracture of distal end of radius 03/2017   left   Genetic testing 05/04/2017   Multi-Cancer panel (83 genes) @ [facility] - No pathogenic mutations detected   GERD (gastroesophageal reflux disease)  Headache    History of hiatal hernia    Hyperlipidemia    Hypothyroidism    Personal history of radiation therapy    Pneumonia    Pre-diabetes 03/2017   follows healthy diabetic diet   Sleep apnea    has not used cpap in a long time   Vitamin B 12 deficiency      Past Surgical History:   Procedure Laterality Date   [place]   when she had c-section   BREAST BIOPSY Left 03/27/2017   INVASIVE MAMMARY CARCINOMA Grade 1 UIG   BREAST LUMPECTOMY Left 04/13/2017   INVASIVE MAMMARY CARCINOMA/ DCIS, CLEAR MARGINS, NEG. LN   CESAREAN SECTION     COLONOSCOPY     COLONOSCOPY WITH PROPOFOL N/A 02/25/2020   Procedure: COLONOSCOPY WITH PROPOFOL;  Surgeon: [name], MD;  [facility] ENDOSCOPY;  Service: Gastroenterology;  Laterality: N/A;   DILATION AND CURETTAGE OF UTERUS     ESOPHAGOGASTRODUODENOSCOPY (EGD) WITH PROPOFOL N/A 02/05/2015   Procedure: ESOPHAGOGASTRODUODENOSCOPY (EGD) WITH PROPOFOL;  Surgeon: Josefine Class, MD;  Location: [facility] ENDOSCOPY;  Service: Endoscopy;  Laterality: N/A;   ESOPHAGOGASTRODUODENOSCOPY (EGD) WITH PROPOFOL N/A 02/25/2020   Procedure: ESOPHAGOGASTRODUODENOSCOPY (EGD) WITH PROPOFOL;  Surgeon: [name], MD;  Location: [facility] ENDOSCOPY;  Service: Gastroenterology;  Laterality: N/A;   PARTIAL MASTECTOMY WITH NEEDLE LOCALIZATION Left 04/13/2017   Procedure: PARTIAL MASTECTOMY WITH NEEDLE LOCALIZATION;  Surgeon: Leonie Green, MD;  Location: [facility] ORS;  Service: General;  Laterality: Left;   PATELLA FRACTURE SURGERY Left 1991   screws in place   SAVORY DILATION N/A 02/05/2015   Procedure: SAVORY DILATION;  Surgeon: Josefine Class, MD;  Location: [facility] ENDOSCOPY;  Service: Endoscopy;  Laterality: N/A;   SENTINEL NODE BIOPSY Left 04/13/2017   Procedure: SENTINEL NODE BIOPSY;  Surgeon: Leonie Green, MD;  Location: [facility] ORS;  Service: General;  Laterality: Left;   SINUS SURGERY WITH INSTATRAK     x 2   XI ROBOTIC ASSISTED PARAESOPHAGEAL HERNIA REPAIR N/A 08/18/2021   Procedure: XI ROBOTIC ASSISTED PARAESOPHAGEAL HERNIA REPAIR with RNFA to assist;  Surgeon: Jules Husbands, MD;  Location: [facility] ORS;  Service: General;  Laterality: N/A;    Social History   Socioeconomic History    Marital status: Married    Spouse name: John   Number of children: Not on file   Years of education: Not on file   Highest education level: Not on file  Occupational History   Not on file  Tobacco Use   Smoking status: Never    Passive exposure: Past   Smokeless tobacco: Never  Vaping Use   Vaping Use: Never used  Substance and Sexual Activity   Alcohol use: No   Drug use: No   Sexual activity: Not Currently  Other Topics Concern   Not on file  Social History Narrative   2 grandchildren lives with them, they have full custody   Social Determinants of Health   Financial Resource Strain: Not on file  Food Insecurity: Not on file  Transportation Needs: Not on file  Physical Activity: Not on file  Stress: Not on file  Social Connections: Not on file  Intimate Partner Violence: Not on file    Family History  Problem Relation Age of Onset   Breast cancer Maternal Aunt 42       deceased 17s   Breast cancer Cousin 17       daughter of mat  aunt with breast cancer   Breast cancer Cousin 54       daughter of mat aunt with breast cancer   Deep vein thrombosis Mother    Heart disease Mother    Stroke Mother    Heart disease Father    Parkinson's disease Father    Heart disease Sister    Stroke Sister    Skin cancer Brother    Heart disease Brother    Alzheimer's disease Brother    Lung cancer Paternal Uncle        deceased 35s   Heart disease Brother    Alzheimer's disease Brother    Heart disease Brother    Alzheimer's disease Brother    Cancer Brother        unclear primary; currently 52s   Cirrhosis Sister    Alzheimer's disease Sister    Kidney cancer Son 37       nephrectomy @ [facility]     Current Outpatient Medications:    acetaminophen (TYLENOL) 500 MG tablet, Take 1,000 mg by mouth every 6 (six) hours as needed for moderate pain or headache., Disp: , Rfl:    amphetamine-dextroamphetamine (ADDERALL XR) 10 MG 24 hr capsule, Take 10 mg by mouth daily., Disp:  , Rfl:    anastrozole (ARIMIDEX) 1 MG tablet, TAKE 1 TABLET BY MOUTH EVERY DAY, Disp: 90 tablet, Rfl: 0   ARIPiprazole (ABILIFY) 10 MG tablet, Take by mouth., Disp: , Rfl:    atorvastatin (LIPITOR) 40 MG tablet, Take 40 mg by mouth daily. , Disp: , Rfl:    calcium carbonate (OSCAL) 1500 (600 Ca) MG TABS tablet, Take by mouth 2 (two) times daily with a meal., Disp: , Rfl:    diphenhydrAMINE (BENADRYL) 50 MG capsule, Take 1 pill one hour before the CT scan, Disp: 1 capsule, Rfl: 0   fluticasone (FLONASE) 50 MCG/ACT nasal spray, Place 2 sprays into both nostrils daily as needed for allergies., Disp: , Rfl:    levocetirizine (XYZAL) 5 MG tablet, Take 5 mg by mouth every evening. , Disp: , Rfl:    levothyroxine (SYNTHROID, LEVOTHROID) 150 MCG tablet, Take 150 mcg by mouth daily before breakfast., Disp: , Rfl:    lipase/protease/amylase (CREON) 36000 UNITS CPEP capsule, Take 2 capsules with the first bite of each meal and 1 capsule with the first bite of each snack, Disp: 240 capsule, Rfl: 3   LORazepam (ATIVAN) 0.5 MG tablet, Take 0.5 mg by mouth 3 (three) times daily., Disp: , Rfl:    metFORMIN (GLUCOPHAGE-XR) 500 MG 24 hr tablet, Take 1,000 mg by mouth 2 (two) times daily., Disp: , Rfl:    mupirocin ointment (BACTROBAN) 2 %, Apply 1 application topically 3 (three) times daily as needed., Disp: , Rfl:    oxybutynin (DITROPAN-XL) 5 MG 24 hr tablet, Take 1 tablet by mouth daily., Disp: , Rfl:    predniSONE (DELTASONE) 50 MG tablet, Take 1 pill 13 hours before the CT scan,  take 1 pill 7 hours before the CT scan, and take 1 pill one hour before CT scan, Disp: 3 tablet, Rfl: 0   tamsulosin (FLOMAX) 0.4 MG CAPS capsule, Take by mouth., Disp: , Rfl:    traZODone (DESYREL) 150 MG tablet, Take 150 mg by mouth at bedtime., Disp: , Rfl:    vitamin B-12 (CYANOCOBALAMIN) 1000 MCG tablet, Take 1,000 mcg by mouth daily., Disp: , Rfl:    vortioxetine HBr (TRINTELLIX) 20 MG TABS tablet, Take 20 mg by mouth daily.,  Disp: ,  Rfl:    methocarbamol (ROBAXIN) 500 MG tablet, Take 1 tablet (500 mg total) by mouth every 8 (eight) hours as needed for muscle spasms. (Patient not taking: Reported on 11/08/2021), Disp: 30 tablet, Rfl: 0   ondansetron (ZOFRAN-ODT) 4 MG disintegrating tablet, Take 1 tablet (4 mg total) by mouth every 6 (six) hours as needed for nausea. (Patient not taking: Reported on 11/08/2021), Disp: 20 tablet, Rfl: 0  Physical exam:  Vitals:   11/08/21 1428  BP: 132/80  Pulse: 68  Resp: 16  SpO2: 96%  Weight: 169 lb (76.7 kg)   Physical Exam Constitutional:      General: She is not in acute distress. Cardiovascular:     Rate and Rhythm: Normal rate and regular rhythm.     Heart sounds: Normal heart sounds.  Pulmonary:     Effort: Pulmonary effort is normal.     Breath sounds: Normal breath sounds.  Skin:    General: Skin is warm and dry.  Neurological:     Mental Status: She is alert and oriented to person, place, and time.   Breast exam was performed in seated and lying down position. Patient is status post left lumpectomy with a well-healed surgical scar. No evidence of any palpable masses. No evidence of axillary adenopathy. No evidence of any palpable masses or lumps in the right breast. No evidence of right axillary adenopathy       Latest Ref Rng & Units 08/19/2021    6:09 AM  CMP  Glucose 70 - 99 mg/dL 121   BUN 8 - 23 mg/dL 15   Creatinine 0.44 - 1.00 mg/dL 0.75   Sodium 135 - 145 mmol/L 137   Potassium 3.5 - 5.1 mmol/L 4.2   Chloride 98 - 111 mmol/L 106   CO2 22 - 32 mmol/L 24   Calcium 8.9 - 10.3 mg/dL 8.0       Latest Ref Rng & Units 08/19/2021    6:09 AM  CBC  WBC 4.0 - 10.5 K/uL 13.0   Hemoglobin 12.0 - 15.0 g/dL 11.8   Hematocrit 36.0 - 46.0 % 37.5   Platelets 150 - 400 K/uL 228     No images are attached to the encounter.  CT Abdomen Pelvis W Contrast  Result Date: 11/09/2021 CLINICAL DATA:  Pancreatic insufficiency. Chronic diarrhea. Concern for  pancreatitis. EXAM: CT ABDOMEN AND PELVIS WITH CONTRAST TECHNIQUE: Multidetector CT imaging of the abdomen and pelvis was performed using the standard protocol following bolus administration of intravenous contrast. RADIATION DOSE REDUCTION: This exam was performed according to the departmental dose-optimization program which includes automated exposure control, adjustment of the mA and/or kV according to patient size and/or use of iterative reconstruction technique. Patient pre-medicated for contrast allergy. No adverse event reported. CONTRAST:  159m OMNIPAQUE IOHEXOL 300 MG/ML  SOLN COMPARISON:  CT 07/20/2021 FINDINGS: Lower chest: Lung bases are clear. Hepatobiliary: No focal hepatic lesion. No biliary duct dilatation. Common bile duct is normal. Pancreas: Pancreas is normal. No ductal dilatation. No pancreatic inflammation. Spleen: Normal spleen Adrenals/urinary tract: Adrenal glands normal. Simple fluid attenuation cyst of the LEFT kidney measures 15 mm. Ureters and bladder normal. Stomach/Bowel: Stomach, small-bowel and cecum normal. Post appendectomy. The colon and rectosigmoid colon are normal. Vascular/Lymphatic: Abdominal aorta is normal caliber with atherosclerotic calcification. There is no retroperitoneal or periportal lymphadenopathy. No pelvic lymphadenopathy. Reproductive: Post hysterectomy.  Adnexa unremarkable Other: No free fluid. Musculoskeletal: No aggressive osseous lesion. IMPRESSION: 1. No evidence acute pancreatitis.  Normal pancreatic parenchyma.  2. 1.5 cm left Bosniak 1 benign simple cyst. No follow-up imaging is recommended. Radiology 2019; [phone], Bosniak Classification of Cystic Renal Masses, Version 2019. 3.  Aortic Atherosclerosis (ICD10-I70.0). Electronically Signed   By: Suzy Bouchard M.D.   On: 11/09/2021 09:47     Assessment and plan- Patient is a 71 y.o. female with pathological prognostic stage Ia invasive mammary carcinoma of the right breast p T1b p N0 c M0 ER PR  positive HER-2/neu negative status post lumpectomy and adjuvant RT and currently on arimidex.  She is here for routine follow-up visit  Clinically patient is doing well with no concerning signs and symptoms of recurrence based on today's exam.  She is toleratingArimidex well without any significant side effects and will continue to take until next year.  Her recent mammogram from April 2023 was unremarkable.  Patient gets weekly labs every 18 months and will be due for her next dose in July 2024 and I will schedule it for that time.  I will see her back in 6 months no labs.  She will get a bone density scan prior   Visit Diagnosis 1. Malignant neoplasm of upper-outer quadrant of left breast in female, estrogen receptor positive ([place])   2. Osteopenia, unspecified location   3. Visit for monitoring Arimidex therapy      Dr. Randa Evens, MD, MPH [facility] [phone] 11/10/2021 7:47 AM

## 2021-11-14 ENCOUNTER — Telehealth: Payer: Self-pay

## 2021-11-14 NOTE — Telephone Encounter (Signed)
Patient verbalized understanding  

## 2021-11-14 NOTE — Telephone Encounter (Signed)
-----   Message from [name], MD sent at 11/11/2021 12:22 PM EDT ----- Normal pancreas based on CT abdomen and pelvis  RV

## 2021-12-03 ENCOUNTER — Other Ambulatory Visit: Payer: Self-pay | Admitting: Oncology

## 2021-12-05 ENCOUNTER — Other Ambulatory Visit: Payer: Self-pay

## 2021-12-05 ENCOUNTER — Ambulatory Visit (INDEPENDENT_AMBULATORY_CARE_PROVIDER_SITE_OTHER): Payer: Medicare Other | Admitting: Internal Medicine

## 2021-12-05 ENCOUNTER — Ambulatory Visit (INDEPENDENT_AMBULATORY_CARE_PROVIDER_SITE_OTHER): Payer: Medicare Other | Admitting: Surgery

## 2021-12-05 ENCOUNTER — Encounter: Payer: Self-pay | Admitting: Surgery

## 2021-12-05 VITALS — BP 135/72 | HR 60 | Temp 98.3°F | Ht 67.5 in | Wt 165.0 lb

## 2021-12-05 VITALS — BP 126/66 | HR 57 | Resp 16 | Ht 67.5 in | Wt 164.0 lb

## 2021-12-05 DIAGNOSIS — K449 Diaphragmatic hernia without obstruction or gangrene: Secondary | ICD-10-CM

## 2021-12-05 DIAGNOSIS — G4733 Obstructive sleep apnea (adult) (pediatric): Secondary | ICD-10-CM | POA: Diagnosis not present

## 2021-12-05 DIAGNOSIS — R197 Diarrhea, unspecified: Secondary | ICD-10-CM

## 2021-12-05 DIAGNOSIS — Z09 Encounter for follow-up examination after completed treatment for conditions other than malignant neoplasm: Secondary | ICD-10-CM | POA: Diagnosis not present

## 2021-12-05 DIAGNOSIS — Z7189 Other specified counseling: Secondary | ICD-10-CM | POA: Diagnosis not present

## 2021-12-05 DIAGNOSIS — Z9989 Dependence on other enabling machines and devices: Secondary | ICD-10-CM | POA: Diagnosis not present

## 2021-12-05 NOTE — Patient Instructions (Addendum)
You may try a dumping syndrome diet to see if this helps. You may also try using Kaopectate for this.  Continue to work with your primary care provider for your Metformin.   Follow up here in 3 months.   Dumping Syndrome Dumping syndrome is a combination of symptoms that occur when food passes through the stomach too quickly. This is most often caused by surgery for weight loss (bariatric) or surgery of the stomach (gastric). There are two types of dumping syndrome, and some people have both types. Early dumping syndrome causes symptoms that occur soon after eating (10-30 minutes). Late dumping syndrome causes symptoms that occur a few hours after eating (2-3 hours). When you eat and drink, your stomach needs time to digest everything. With dumping syndrome, undigested food and stomach fluids move too quickly into the small intestine, causing symptoms of early dumping syndrome. In other cases, large amounts of sugar move into the small intestine, which causes the pancreas to release too much of the hormone insulin. As a result, your blood sugar can get too low (hypoglycemia), and you may get symptoms of late dumping syndrome. What are the causes? Early dumping syndrome is caused by large amounts of undigested food and liquids passing into the upper part of your small intestine. Late dumping syndrome is caused by large amounts of sugar from your diet moving into your small intestine. What increases the risk? Certain types of stomach surgery can lead to dumping syndrome. You may be at risk for dumping syndrome if you have had: Surgery for ulcers or stomach cancer. Bariatric surgery. Surgery to treat severe heartburn (gastroesophageal reflux). What are the signs or symptoms? Early dumping syndrome Symptoms of early dumping syndrome may include: Nausea and vomiting. Fullness, bloating, or cramping. Feeling flushed or dizzy. Sweating. A fast or irregular heartbeat  (palpitations). Diarrhea. Headache. Late dumping syndrome Symptoms of late dumping syndrome may include: Dizziness. Weakness. Sweating. Palpitations. Hunger. Confusion. How is this diagnosed? This condition may be diagnosed based on: Your symptoms and medical history. A physical exam. Tests, such as: A modified glucose tolerance test. This blood test measures insulin secretion after you have a certain type of sugary (glucose) drink. Gastric emptying test. This test involves eating a bland meal that includes a material that shows up easily on an X-ray. After eating the meal, you have X-rays to find out how long it takes the stomach to empty. Upper endoscopy. In this exam, a health care provider puts a flexible telescope with a light on the end of it (endoscope) through your mouth and throat to check the inside of your stomach. How is this treated? For most people, this condition can be managed with diet changes. You may need to change your diet, nutrition, and eating habits. Working with a dietitian can be helpful. Other treatments may include: Medicine that slows down stomach emptying and reduces insulin secretion. This medicine may be given by injection. Medicine that slows down the digestion of sugar. This medicine is usually used to treat type 2 diabetes. Sometimes it can also relieve symptoms of late dumping syndrome. Surgery. For severe cases of dumping syndrome, reconstructive stomach surgery may be needed if other treatments are not working. Follow these instructions at home: Eating strategies Avoid drinking liquids for at least 30 minutes before and after meals. Lie down for 15-30 minutes after meals. This will help to prevent light-headedness and will slow down the emptying of your stomach. Eat smaller portions of food. Cut food into small pieces  and chew thoroughly before you swallow. Work with a Microbiologist to change your diet and maintain good nutrition. What to eat and  drink  Try to make sure that at least half of your daily servings of grains come from whole grains. These foods are high in fiber, which helps to slow down digestion. Eat more complex carbohydrates, such as whole grains, vegetables, and beans. With each meal and snack, eat one food that is high in protein. Take a fiber supplement if recommended by your health care provider. Increase the thickness of foods by adding plant-based thickening agents, such as pectin. Try to avoid foods such as: Simple carbohydrates and foods that have added sugar, such as fruit juices, white bread, pastries, and sweets. Foods that are high in sugar are especially likely to bring about symptoms. Very hot or very cold foods. These may aggravate your symptoms. Drinking alcohol. Dairy products if they aggravate your symptoms. Try lactose-free dairy products instead. General instructions Take over-the-counter and prescription medicines only as told by your health care provider. Keep all follow-up visits. This is important. Contact a health care provider if: You notice that your symptoms change or get worse. You find that diet changes do not help your symptoms. You have trouble maintaining a healthy weight. Get help right away if: You become dehydrated. You have chest pain. You have severe dizziness. You notice that your heartbeat becomes very irregular. These symptoms may be an emergency. Get help right away. Call 911. Do not wait to see if the symptoms will go away. Do not drive yourself to the hospital. Summary Dumping syndrome is a combination of symptoms that occur when food passes through the stomach too quickly. Symptoms may include nausea, vomiting, diarrhea, dizziness, weakness, or hunger. This condition is often treated with dietary changes and medicines. Work with a Microbiologist to change your diet and maintain good nutrition. This information is not intended to replace advice given to you by your health  care provider. Make sure you discuss any questions you have with your health care provider. Document Revised: 04/11/2021 Document Reviewed: 04/11/2021 Elsevier Patient Education  [place].

## 2021-12-05 NOTE — Patient Instructions (Signed)

## 2021-12-05 NOTE — Progress Notes (Signed)
[facility] [place], [place] [address]  Pulmonary Sleep Medicine   Office Visit Note  Patient Name: April Leblanc DOB: 1950-09-15 MRN [id number]    Chief Complaint: Obstructive Sleep Apnea visit  Brief History:  April Leblanc is seen today for an annual follow up on APAP at 5-10cmh2O.  The patient has a 18 year history of sleep apnea. Patient is using PAP nightly.  The patient feels rest after sleeping with PAP.  The patient reports benefit from PAP use. Reported sleepiness is  improved and the Epworth Sleepiness Score is 3 out of 24. The patient does not take naps. The patient complains of the following: Claustrophobia with mask.   The compliance download shows 93.3% compliance with an average use time of 7 hours 17 minutes. The AHI is 0.6  The patient does not complain of limb movements disrupting sleep.  Patient has had good use for the past month but less for the last year.  She had a surgery and has had issues with mask that come and go.  ROS  General: (-) fever, (-) chills, (-) night sweat Nose and Sinuses: (-) nasal stuffiness or itchiness, (-) postnasal drip, (-) nosebleeds, (-) sinus trouble. Mouth and Throat: (-) sore throat, (-) hoarseness. Neck: (-) swollen glands, (-) enlarged thyroid, (-) neck pain. Respiratory: - cough, - shortness of breath, - wheezing. Neurologic: - numbness, - tingling. Psychiatric: - anxiety, + depression   Current Medication: Outpatient Encounter Medications as of 12/05/2021  Medication Sig Note   acetaminophen (TYLENOL) 500 MG tablet Take 1,000 mg by mouth every 6 (six) hours as needed for moderate pain or headache.    amphetamine-dextroamphetamine (ADDERALL XR) 10 MG 24 hr capsule Take 10 mg by mouth daily.    amphetamine-dextroamphetamine (ADDERALL) 20 MG tablet Take 20 mg by mouth every morning.    ARIPiprazole (ABILIFY) 10 MG tablet Take by mouth.    ARIPiprazole (ABILIFY) 15 MG tablet Take 15 mg by mouth daily.     atorvastatin (LIPITOR) 40 MG tablet Take 40 mg by mouth daily.     BANOPHEN 25 MG capsule SMARTSIG:2 pill By Mouth    calcium carbonate (OSCAL) 1500 (600 Ca) MG TABS tablet Take by mouth 2 (two) times daily with a meal.    darifenacin (ENABLEX) 15 MG 24 hr tablet TK 1 T PO ONCE D FOR OVERACTIVE BLADDER.    fluticasone (FLONASE) 50 MCG/ACT nasal spray Place 2 sprays into both nostrils daily as needed for allergies.    HYDROcodone-acetaminophen (NORCO/VICODIN) 5-325 MG tablet     levocetirizine (XYZAL) 5 MG tablet Take 5 mg by mouth every evening.     levothyroxine (SYNTHROID) 150 MCG tablet Take by mouth.    lipase/protease/amylase (CREON) 36000 UNITS CPEP capsule Take 2 capsules with the first bite of each meal and 1 capsule with the first bite of each snack    LORazepam (ATIVAN) 0.5 MG tablet Take 0.5 mg by mouth 3 (three) times daily.    metFORMIN (GLUCOPHAGE-XR) 500 MG 24 hr tablet Take 1,000 mg by mouth daily with breakfast. 05/06/2021: All 4 tablets at evening after supper   montelukast (SINGULAIR) 10 MG tablet     mupirocin ointment (BACTROBAN) 2 % Apply 1 application topically 3 (three) times daily as needed.    ondansetron (ZOFRAN-ODT) 4 MG disintegrating tablet Take 1 tablet (4 mg total) by mouth every 6 (six) hours as needed for nausea.    oxybutynin (DITROPAN-XL) 5 MG 24 hr tablet Take 1 tablet  by mouth daily.    oxyCODONE-acetaminophen (PERCOCET/ROXICET) 5-325 MG tablet     ranitidine (ZANTAC) 300 MG tablet     tamsulosin (FLOMAX) 0.4 MG CAPS capsule Take by mouth.    traZODone (DESYREL) 150 MG tablet Take 150 mg by mouth at bedtime.    vitamin B-12 (CYANOCOBALAMIN) 1000 MCG tablet Take 1,000 mcg by mouth daily.    vortioxetine HBr (TRINTELLIX) 20 MG TABS tablet Take 20 mg by mouth daily. 08/10/2021: Added to this list in 2016. No evidence of being filled in the past 12 months   [DISCONTINUED] anastrozole (ARIMIDEX) 1 MG tablet TAKE 1 TABLET BY MOUTH EVERY DAY    [DISCONTINUED]  diphenhydrAMINE (BENADRYL) 50 MG capsule Take 1 pill one hour before the CT scan    [DISCONTINUED] levothyroxine (SYNTHROID, LEVOTHROID) 150 MCG tablet Take 150 mcg by mouth daily before breakfast.    No facility-administered encounter medications on file as of 12/05/2021.    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   CESAREAN SECTION     COLONOSCOPY     COLONOSCOPY WITH PROPOFOL N/A 02/25/2020   Procedure: COLONOSCOPY WITH PROPOFOL;  Surgeon: [name], MD;  Location: [facility] ENDOSCOPY;  Service: Gastroenterology;  Laterality: N/A;   DILATION AND CURETTAGE OF UTERUS     ESOPHAGOGASTRODUODENOSCOPY (EGD) WITH PROPOFOL N/A 02/05/2015   Procedure: ESOPHAGOGASTRODUODENOSCOPY (EGD) WITH PROPOFOL;  Surgeon: Josefine Class, MD;  Location[facility] ENDOSCOPY;  Service: Endoscopy;  Laterality: N/A;   ESOPHAGOGASTRODUODENOSCOPY (EGD) WITH PROPOFOL N/A 02/25/2020   Procedure: ESOPHAGOGASTRODUODENOSCOPY (EGD) WITH PROPOFOL;  Surgeon: [name], MD;  Location: [facility] ENDOSCOPY;  Service: Gastroenterology;  Laterality: N/A;   PARTIAL MASTECTOMY WITH NEEDLE LOCALIZATION Left 04/13/2017   Procedure: PARTIAL MASTECTOMY WITH NEEDLE LOCALIZATION;  Surgeon: Leonie Green, MD;  Location: [facility] ORS;  Service: General;  Laterality: Left;   PATELLA FRACTURE SURGERY Left 1991   screws in place   SAVORY DILATION N/A 02/05/2015   Procedure: SAVORY DILATION;  Surgeon: Josefine Class, MD;  Location: [facility] ENDOSCOPY;  Service: Endoscopy;  Laterality: N/A;   SENTINEL NODE BIOPSY Left 04/13/2017   Procedure: SENTINEL NODE BIOPSY;  Surgeon: Leonie Green, MD;  Location: [facility] ORS;  Service: General;  Laterality: Left;    SINUS SURGERY WITH INSTATRAK     x 2   XI ROBOTIC ASSISTED PARAESOPHAGEAL HERNIA REPAIR N/A 08/18/2021   Procedure: XI ROBOTIC ASSISTED PARAESOPHAGEAL HERNIA REPAIR with RNFA to assist;  Surgeon: Jules Husbands, MD;  Location: [facility] ORS;  Service: General;  Laterality: N/A;    Medical History: Past Medical History:  Diagnosis Date   Anemia    Ankle sprain 03/2017   Anxiety    Arthritis    Breast cancer ([place]) 2018   Breast cancer ([place]) 03/2017   Depression    Diabetes mellitus without complication ([place])    Fracture of distal end of radius 03/2017   left   Genetic testing 05/04/2017   Multi-Cancer panel (83 genes) @ [facility] - No pathogenic mutations detected   GERD (gastroesophageal reflux disease)    Headache    History of hiatal hernia    Hyperlipidemia    Hypothyroidism    Personal history of radiation therapy  Pneumonia    Pre-diabetes 03/2017   follows healthy diabetic diet   Sleep apnea    has not used cpap in a long time   Vitamin B 12 deficiency     Family History: Non contributory to the present illness  Social History: Social History   Socioeconomic History   Marital status: Married    Spouse name: John   Number of children: Not on file   Years of education: Not on file   Highest education level: Not on file  Occupational History   Not on file  Tobacco Use   Smoking status: Never    Passive exposure: Past   Smokeless tobacco: Never  Vaping Use   Vaping Use: Never used  Substance and Sexual Activity   Alcohol use: No   Drug use: No   Sexual activity: Not Currently  Other Topics Concern   Not on file  Social History Narrative   2 grandchildren lives with them, they have full custody   Social Determinants of Health   Financial Resource Strain: Not on file  Food Insecurity: Not on file  Transportation Needs: Not on file  Physical Activity: Not on file  Stress: Not on file  Social Connections: Not on file  Intimate Partner Violence: Not on  file    Vital Signs: Blood pressure 126/66, pulse (!) 57, resp. rate 16, height 5' 7.5" (1.715 m), weight 164 lb (74.4 kg), SpO2 97 %. Body mass index is 25.31 kg/m.    Examination: General Appearance: The patient is well-developed, well-nourished, and in no distress. Neck Circumference: 37 cm Skin: Gross inspection of skin unremarkable. Head: normocephalic, no gross deformities. Eyes: no gross deformities noted. ENT: ears appear grossly normal Neurologic: Alert and oriented. No involuntary movements.  STOP BANG RISK ASSESSMENT S (snore) Have you been told that you snore?     NO   T (tired) Are you often tired, fatigued, or sleepy during the day?   YES  O (obstruction) Do you stop breathing, choke, or gasp during sleep? YES   P (pressure) Do you have or are you being treated for high blood pressure? NO   B (BMI) Is your body index greater than 35 kg/m? NO   A (age) Are you 30 years old or older? YES   N (neck) Do you have a neck circumference greater than 16 inches?   NO   G (gender) Are you a female? NO   TOTAL STOP/BANG "YES" ANSWERS 3       A STOP-Bang score of 2 or less is considered low risk, and a score of 5 or more is high risk for having either moderate or severe OSA. For people who score 3 or 4, doctors may need to perform further assessment to determine how likely they are to have OSA.         EPWORTH SLEEPINESS SCALE:  Scale:  (0)= no chance of dozing; (1)= slight chance of dozing; (2)= moderate chance of dozing; (3)= high chance of dozing  Chance  Situtation    Sitting and reading: 1    Watching TV: 1    Sitting Inactive in public: 0    As a passenger in car: 0      Lying down to rest: 1    Sitting and talking: 0    Sitting quielty after lunch: 0    In a car, stopped in traffic: 0   TOTAL SCORE:   3 out of 24    SLEEP STUDIES:  PSG (07/02/03) AHI 11, SPO2 85% TITRATION (07/15/03) CPAP at 5cmh20   CPAP COMPLIANCE DATA:  Date Range:  11/05/21 - 12/04/21  Average Daily Use: 7 hours 17 minutes  Median Use: 7 hours 32 minutes  Compliance for > 4 Hours: 29 days  AHI: 0.6 respiratory events per hour  Days Used: 29/30  Mask Leak: 8  95th Percentile Pressure: 8.6 cmh20         LABS: Recent Results (from the past 2160 hour(s))  Celiac Disease Panel     Status: None   Collection Time: 10/20/21  2:56 PM  Result Value Ref Range   Endomysial IgA Negative Negative   Transglutaminase IgA <2 0 - 3 U/mL    Comment:                               Negative        0 -  3                               Weak Positive   4 - 10                               Positive           >10  Tissue Transglutaminase (tTG) has been identified  as the endomysial antigen.  Studies have demonstr-  ated that endomysial IgA antibodies have over 99%  specificity for gluten sensitive enteropathy.    IgA/Immunoglobulin A, Serum 147 87 - 352 mg/dL  Calprotectin, Fecal     Status: None   Collection Time: 10/21/21  6:30 AM   Specimen: Stool  Result Value Ref Range   Calprotectin, Fecal 15 0 - 120 ug/g    Comment: Concentration     Interpretation   Follow-Up < 5 - 50 ug/g     Normal           None >50 -120 ug/g     Borderline       Re-evaluate in 4-6 weeks     >120 ug/g     Abnormal         Repeat as clinically                                    indicated   Pancreatic elastase, fecal     Status: Abnormal   Collection Time: 10/21/21  6:30 AM  Result Value Ref Range   Pancreatic Elastase, Fecal 82 (L) >200 ug Elast./g    Comment: **Results verified by repeat testing**        Severe Pancreatic Insufficiency:          <100        Moderate Pancreatic Insufficiency:   100 - 200        Normal:                                   >200     Radiology: CT Abdomen Pelvis W Contrast  Result Date: 11/09/2021 CLINICAL DATA:  Pancreatic insufficiency. Chronic diarrhea. Concern for pancreatitis. EXAM: CT ABDOMEN AND PELVIS WITH CONTRAST TECHNIQUE:  Multidetector CT imaging of the abdomen and pelvis was performed using the standard  protocol following bolus administration of intravenous contrast. RADIATION DOSE REDUCTION: This exam was performed according to the departmental dose-optimization program which includes automated exposure control, adjustment of the mA and/or kV according to patient size and/or use of iterative reconstruction technique. Patient pre-medicated for contrast allergy. No adverse event reported. CONTRAST:  140m OMNIPAQUE IOHEXOL 300 MG/ML  SOLN COMPARISON:  CT 07/20/2021 FINDINGS: Lower chest: Lung bases are clear. Hepatobiliary: No focal hepatic lesion. No biliary duct dilatation. Common bile duct is normal. Pancreas: Pancreas is normal. No ductal dilatation. No pancreatic inflammation. Spleen: Normal spleen Adrenals/urinary tract: Adrenal glands normal. Simple fluid attenuation cyst of the LEFT kidney measures 15 mm. Ureters and bladder normal. Stomach/Bowel: Stomach, small-bowel and cecum normal. Post appendectomy. The colon and rectosigmoid colon are normal. Vascular/Lymphatic: Abdominal aorta is normal caliber with atherosclerotic calcification. There is no retroperitoneal or periportal lymphadenopathy. No pelvic lymphadenopathy. Reproductive: Post hysterectomy.  Adnexa unremarkable Other: No free fluid. Musculoskeletal: No aggressive osseous lesion. IMPRESSION: 1. No evidence acute pancreatitis.  Normal pancreatic parenchyma. 2. 1.5 cm left Bosniak 1 benign simple cyst. No follow-up imaging is recommended. Radiology 2019; [phone] Bosniak Classification of Cystic Renal Masses, Version 2019. 3.  Aortic Atherosclerosis (ICD10-I70.0). Electronically Signed   By: SSuzy BouchardM.D.   On: 11/09/2021 09:47    No results found.  CT Abdomen Pelvis W Contrast  Result Date: 11/09/2021 CLINICAL DATA:  Pancreatic insufficiency. Chronic diarrhea. Concern for pancreatitis. EXAM: CT ABDOMEN AND PELVIS WITH CONTRAST TECHNIQUE:  Multidetector CT imaging of the abdomen and pelvis was performed using the standard protocol following bolus administration of intravenous contrast. RADIATION DOSE REDUCTION: This exam was performed according to the departmental dose-optimization program which includes automated exposure control, adjustment of the mA and/or kV according to patient size and/or use of iterative reconstruction technique. Patient pre-medicated for contrast allergy. No adverse event reported. CONTRAST:  1045mOMNIPAQUE IOHEXOL 300 MG/ML  SOLN COMPARISON:  CT 07/20/2021 FINDINGS: Lower chest: Lung bases are clear. Hepatobiliary: No focal hepatic lesion. No biliary duct dilatation. Common bile duct is normal. Pancreas: Pancreas is normal. No ductal dilatation. No pancreatic inflammation. Spleen: Normal spleen Adrenals/urinary tract: Adrenal glands normal. Simple fluid attenuation cyst of the LEFT kidney measures 15 mm. Ureters and bladder normal. Stomach/Bowel: Stomach, small-bowel and cecum normal. Post appendectomy. The colon and rectosigmoid colon are normal. Vascular/Lymphatic: Abdominal aorta is normal caliber with atherosclerotic calcification. There is no retroperitoneal or periportal lymphadenopathy. No pelvic lymphadenopathy. Reproductive: Post hysterectomy.  Adnexa unremarkable Other: No free fluid. Musculoskeletal: No aggressive osseous lesion. IMPRESSION: 1. No evidence acute pancreatitis.  Normal pancreatic parenchyma. 2. 1.5 cm left Bosniak 1 benign simple cyst. No follow-up imaging is recommended. Radiology 2019; [id number] Classification of Cystic Renal Masses, Version 2019. 3.  Aortic Atherosclerosis (ICD10-I70.0). Electronically Signed   By: StSuzy Bouchard.D.   On: 11/09/2021 09:47      Assessment and Plan: Patient Active Problem List   Diagnosis Date Noted   S/P repair of paraesophageal hernia 08/18/2021   OSA on CPAP 11/29/2020   CPAP use counseling 11/29/2020   NASH (nonalcoholic  steatohepatitis) 08/23/2020   Type 2 diabetes mellitus without complication, without long-term current use of insulin [place]/25/20[place]   Acute esophagitis    Depression 04/17/2019   Migraine headache 04/17/2019   Blepharoconjunctivitis of both eyes 09/18/2018   Dyshidrotic eczema 09/18/2018   Floaters, bilateral 09/18/2018   OAB (overactive bladder) 09/18/2018   Positive ANA (antinuclear antibody) 09/18/2018   Prediabetes 09/18/2018   Urethral caruncle  09/18/2018   Osteopenia 10/15/2017   Genetic testing 05/04/2017   Malignant neoplasm of upper-outer quadrant of left breast in female, estrogen receptor positive ([place]) 04/02/2017   Goals of care, counseling/discussion 04/02/2017   Mixed incontinence urge and stress 02/05/2016   Weakness 06/30/2015   Adhesive capsulitis of left shoulder 08/26/2014   Tendinitis of both rotator cuffs 08/26/2014   Vitamin B12 deficiency 08/25/2014   Bilateral shoulder pain 07/17/2014   Aphasia 10/06/2013   Gait disturbance 10/06/2013   Memory loss 10/06/2013   GERD (gastroesophageal reflux disease) 12/19/2011   Hypercholesterolemia 12/19/2011   Hypothyroidism 12/19/2011   Sleep apnea 12/19/2011    1. OSA on CPAP The patient does tolerate PAP and reports  benefit from PAP use. The patient was reminded how to clean equipment and advised to replace supplies routinely. The patient was also counselled on weight loss. The compliance is excellent in the last 30 days. . The AHI is 0.6.   OSA on cpap- CPAP continues to be medically necessary to treat this patient's OSA.  Continue with excellent compliance. F/u one year.   2. CPAP use counseling CPAP Counseling: had a lengthy discussion with the patient regarding the importance of PAP therapy in management of the sleep apnea. Patient appears to understand the risk factor reduction and also understands the risks associated with untreated sleep apnea. Patient will try to make a good faith effort to remain  compliant with therapy. Also instructed the patient on proper cleaning of the device including the water must be changed daily if possible and use of distilled water is preferred. Patient understands that the machine should be regularly cleaned with appropriate recommended cleaning solutions that do not damage the PAP machine for example given white vinegar and water rinses. Other methods such as ozone treatment may not be as good as these simple methods to achieve cleaning.    General Counseling: I have discussed the findings of the evaluation and examination with Cry[place].  I have also discussed any further diagnostic evaluation thatmay be needed or ordered today. Meridian verbalizes understanding of the findings of todays visit. We also reviewed her medications today and discussed drug interactions and side effects including but not limited excessive drowsiness and altered mental states. We also discussed that there is always a risk not just to her but also people around her. she has been encouraged to call the office with any questions or concerns that should arise related to todays visit.  No orders of the defined types were placed in this encounter.       I have personally obtained a history, examined the patient, evaluated laboratory and imaging results, formulated the assessment and plan and placed orders. This patient was seen today by Tressie Ellis, PA-C in collaboration with Dr. Devona Konig.   Allyne Gee, MD [facility] Diplomate ABMS Pulmonary Critical Care Medicine and Sleep Medicine

## 2021-12-05 NOTE — Progress Notes (Signed)
Outpatient Surgical Follow Up  12/05/2021  April Leblanc is an 71 y.o. female.   Chief Complaint  Patient presents with   Follow-up    HPI: April Leblanc is a 71 year old female status post robotic paraesophageal hernia repair well.  She reports no reflux.  Significant improvement in symptoms.  Chest pain or cough.  Also reports chronic diarrhea that only is partial relief with Imodium.  No fevers or chills.  She has seen Dr. Marius Ditch from GI and appropriate work-up was performed.  Seems to have some pancreatic insufficiency as well.  It is interesting to note that the diarrhea sometimes it happens without having anything to eat or drink.  She also had a recent CT of the abdomen pelvis that I have personally reviewed showing intact repair and intact recent complication.  No evidence of complications from the surgery  Past Medical History:  Diagnosis Date   Anemia    Ankle sprain 03/2017   Anxiety    Arthritis    Breast cancer ([place]) 2018   Breast cancer ([place]) 03/2017   Depression    Diabetes mellitus without complication ([place])    Fracture of distal end of radius 03/2017   left   Genetic testing 05/04/2017   Multi-Cancer panel (83 genes) @ [facility] - No pathogenic mutations detected   GERD (gastroesophageal reflux disease)    Headache    History of hiatal hernia    Hyperlipidemia    Hypothyroidism    Personal history of radiation therapy    Pneumonia    Pre-diabetes 03/2017   follows healthy diabetic diet   Sleep apnea    has not used cpap in a long time   Vitamin B 12 deficiency     Past Surgical History:  Procedure Laterality Date   [place]   when she had c-section   BREAST BIOPSY Left 03/27/2017   INVASIVE MAMMARY CARCINOMA Grade 1 UIG   BREAST LUMPECTOMY Left 04/13/2017   INVASIVE MAMMARY CARCINOMA/ DCIS, CLEAR MARGINS, NEG. LN   CESAREAN SECTION     COLONOSCOPY     COLONOSCOPY WITH PROPOFOL N/A 02/25/2020   Procedure:  COLONOSCOPY WITH PROPOFOL;  Surgeon: [name], MD;  Location: [facility] ENDOSCOPY;  Service: Gastroenterology;  Laterality: N/A;   DILATION AND CURETTAGE OF UTERUS     ESOPHAGOGASTRODUODENOSCOPY (EGD) WITH PROPOFOL N/A 02/05/2015   Procedure: ESOPHAGOGASTRODUODENOSCOPY (EGD) WITH PROPOFOL;  Surgeon: Josefine Class, MD;  Location: [facility] ENDOSCOPY;  Service: Endoscopy;  Laterality: N/A;   ESOPHAGOGASTRODUODENOSCOPY (EGD) WITH PROPOFOL N/A 02/25/2020   Procedure: ESOPHAGOGASTRODUODENOSCOPY (EGD) WITH PROPOFOL;  Surgeon: [name], MD;  Location: [facility] ENDOSCOPY;  Service: Gastroenterology;  Laterality: N/A;   PARTIAL MASTECTOMY WITH NEEDLE LOCALIZATION Left 04/13/2017   Procedure: PARTIAL MASTECTOMY WITH NEEDLE LOCALIZATION;  Surgeon: Leonie Green, MD;  Location: [facility] ORS;  Service: General;  Laterality: Left;   PATELLA FRACTURE SURGERY Left 1991   screws in place   SAVORY DILATION N/A 02/05/2015   Procedure: SAVORY DILATION;  Surgeon: Josefine Class, MD;  Location: [facility] ENDOSCOPY;  Service: Endoscopy;  Laterality: N/A;   SENTINEL NODE BIOPSY Left 04/13/2017   Procedure: SENTINEL NODE BIOPSY;  Surgeon: Leonie Green, MD;  Location: [facility] ORS;  Service: General;  Laterality: Left;   SINUS SURGERY WITH INSTATRAK     x 2   XI ROBOTIC ASSISTED PARAESOPHAGEAL HERNIA REPAIR N/A 08/18/2021   Procedure: XI ROBOTIC ASSISTED PARAESOPHAGEAL HERNIA REPAIR with RNFA to assist;  Surgeon: Jules Husbands, MD;  Location: [facility] ORS;  Service: General;  Laterality: N/A;    Family History  Problem Relation Age of Onset   Breast cancer Maternal Aunt 17       deceased 42s   Breast cancer Cousin 19       daughter of mat aunt with breast cancer   Breast cancer Cousin 2       daughter of mat aunt with breast cancer   Deep vein thrombosis Mother    Heart disease Mother    Stroke Mother    Heart disease Father    Parkinson's disease Father    Heart disease Sister    Stroke  Sister    Skin cancer Brother    Heart disease Brother    Alzheimer's disease Brother    Lung cancer Paternal Uncle        deceased 8s   Heart disease Brother    Alzheimer's disease Brother    Heart disease Brother    Alzheimer's disease Brother    Cancer Brother        unclear primary; currently 59s   Cirrhosis Sister    Alzheimer's disease Sister    Kidney cancer Son 41       nephrectomy @ [facility]    Social History:  reports that she has never smoked. She has been exposed to tobacco smoke. She has never used smokeless tobacco. She reports that she does not drink alcohol and does not use drugs.  Allergies:  Allergies  Allergen Reactions   Amoxicillin Hives, Itching and Other (See Comments)    Has patient had a PCN reaction causing immediate rash, facial/tongue/throat swelling, SOB or lightheadedness with hypotension: No Has patient had a PCN reaction causing severe rash involving mucus membranes or skin necrosis: No Has patient had a PCN reaction that required hospitalization: No Has patient had a PCN reaction occurring within the last 10 years: No If all of the above answers are "NO", then may proceed with Cephalosporin use.    Erythromycin Hives and Itching   Sulfa Antibiotics Hives and Itching   Contrast Media [Iodinated Contrast Media] Rash    Red rash and itching. Topical iodine not a problem.   Morphine Nausea And Vomiting and Other (See Comments)    Pretty severe vomiting. Can take hydrocodone and oxycodone      Medications reviewed.    ROS Full ROS performed and is otherwise negative other than what is stated in HPI   BP 135/72   Pulse 60   Temp 98.3 F (36.8 C)   Ht 5' 7.5" (1.715 m)   Wt 165 lb (74.8 kg)   SpO2 98%   BMI 25.46 kg/m   Physical Exam General:   Alert,  Well-developed, well-nourished, pleasant and cooperative in NAD Head:  Normocephalic and atraumatic. Eyes:  Sclera clear, no icterus.   Conjunctiva pink. Ears:  Normal auditory  acuity. Nose:  No deformity, discharge, or lesions. Mouth:  No deformity or lesions,oropharynx pink & moist. Neck:  Supple; no masses or thyromegaly. Lungs:  Respirations even and unlabored.  Clear throughout to auscultation.   No wheezes, crackles, or rhonchi. No acute distress. Heart:  Regular rate and rhythm; no murmurs, clicks, rubs, or gallops. Abdomen:  Normal bowel sounds.  Robotic scars healed no infection no hernias soft, obese, non-tender and non-distended without masses, hepatosplenomegaly or hernias noted.  No guarding or rebound tenderness.   Rectal: Not performed Msk:  Symmetrical without gross deformities. Good, equal  movement & strength bilaterally. Pulses:  Normal pulses noted. Extremities:  No clubbing or edema.  No cyanosis. Neurologic:  Alert and oriented x3;  grossly normal neurologically. Skin:  Intact without significant lesions or rashes. No jaundice. Psych:  Alert and cooperative. Normal mood and affect    Assessment/Plan: 71 year old female status post robotic paraesophageal hernia repair 4 months ago now with some chronic diarrhea.  Seems to have some pancreatic insufficiency.  I had a lengthy discussion with patient regarding potential etiologies.  For potential etiologies dumping syndrome.  I have given specific instructions in writing to her to avoid this.  Also may take some AK-Tate.  From the surgical perspective there seems to be no complications.  I will see her back in about 3 months.  Please note that I spent 30 minutes in this encounter including personally reviewing imaging studies, coordinating her care, placing orders and performing appropriate mentation  Caroleen Hamman, MD [place] Surgeon

## 2021-12-08 ENCOUNTER — Encounter: Payer: Self-pay | Admitting: Gastroenterology

## 2021-12-08 ENCOUNTER — Ambulatory Visit (INDEPENDENT_AMBULATORY_CARE_PROVIDER_SITE_OTHER): Payer: Medicare Other | Admitting: Gastroenterology

## 2021-12-08 VITALS — BP 125/82 | HR 84 | Temp 98.3°F | Ht 67.5 in | Wt 162.4 lb

## 2021-12-08 DIAGNOSIS — K8689 Other specified diseases of pancreas: Secondary | ICD-10-CM | POA: Diagnosis not present

## 2021-12-08 NOTE — Progress Notes (Signed)
April Darby, MD [address]  [place]  [place], [place] [address]  Main: [phone]  Fax: [phone]    Gastroenterology Consultation  Referring Provider:     Gustavo Lah, MD Primary Care Physician:  Gustavo Lah, MD Primary Gastroenterologist:  Dr. Cephas Leblanc Reason for Consultation: Exocrine pancreatic insufficiency        HPI:   April Leblanc is a 71 y.o. female referred by Dr. Arlington Calix, Fanny Dance, MD  for consultation & management of elevated LFTs.  Patient has history of obesity, hypothyroidism, hyperlipidemia, chronic GERD, diabetes, fatty liver, history of medium size hiatal hernia s/p robotic assisted laparoscopic Nissen fundoplication with repair of paraesophageal hernia on 08/18/2021.  Patient is currently off PPI since surgery.  Patient is here today with complaint of cough nonbloody diarrhea.  Patient tells me that a week after her hernia surgery she has been experiencing 5-6 nonbloody, watery bowel meds daily associated with abdominal bloating, gas, symptoms worse postprandial and she has been losing weight.  She also reports episodes of fecal incontinence and she has to wear depends, she did have nocturnal episodes as well.  Patient has been taking metformin for a long time.  Recently, she has been started on Flomax.  But, patient states her diarrhea started before initiation of Flomax.  No evidence of anemia, TSH, normal LFTs  Her PCP performed stool studies on 10/14/2021 including C. difficile, ova and parasites, stool culture which all came back negative.  Patient is taking Imodium as needed which helps to stop having a BM but diarrhea returns.  Follow-up visit 12/08/2021 Patient is here for follow-up of diarrhea.  She is found to have pancreatic fecal elastase levels were less than 100, started her on Creon 72 K with each meal and 36 K with snack.  CT abdomen and pelvis with contrast revealed normal pancreas.  She is trying to follow low  fat, low carb diet and reports that the diarrhea is improving.  She lost few pounds by following healthy diet.  She had 1 formed bowel movement yesterday.  She denies any abdominal bloating, abdominal pain.  NSAIDs: None  Antiplts/Anticoagulants/Anti thrombotics: None  Patient did not undergo colonoscopy in the past GI Procedures: EGD 02/05/2015 - Medium-sized hiatus hernia. - LA Grade A reflux esophagitis. Biopsied. - Discolored, granular mucosa in the esophagus. Biopsied. - Normal stomach. - Normal examined duodenum. DIAGNOSIS:  A. GEJ; COLD BIOPSY:  - SQUAMOCOLUMNAR MUCOSA WITH MILD GASTRITIS, COMPATIBLE WITH REFLUX.  - NEGATIVE FOR DYSPLASIA AND MALIGNANCY.   B. ESOPHAGUS ABNORMAL MUCOSA, AT 17 CM; COLD BIOPSY:  - REFLUX GASTROESOPHAGITIS.  - NEGATIVE FOR DYSPLASIA AND MALIGNANCY.  EGD and colonoscopy 02/25/2020 - Normal esophagus and gastroesophageal junction. - Normal stomach. - Normal examined duodenum. - Medium-sized hiatal hernia. - Esophagogastric landmarks identified. - No specimens collected.  - One diminutive polyp in the ascending colon, removed with a cold biopsy forceps. Resected and retrieved. - One 4 mm polyp in the transverse colon, removed with a cold snare. Resected and retrieved. - The examination was otherwise normal. - Non-bleeding external hemorrhoids. - A single non-bleeding colonic angiodysplastic lesion.  DIAGNOSIS:  A. COLON POLYP, ASCENDING; COLD BIOPSY:  - TUBULAR ADENOMA.  - NEGATIVE FOR HIGH-GRADE DYSPLASIA AND MALIGNANCY.   B.  COLON POLYP, TRANSVERSE; COLD SNARE:  - TUBULAR ADENOMA.  - NEGATIVE FOR HIGH-GRADE DYSPLASIA AND MALIGNANCY.   Ultrasound-guided liver biopsy 05/11/2020 DIAGNOSIS:  A. 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  - NAS SCORE 4 OF 8       - STEATOSIS - SCORE 2 (>33-66%)       - LOBULAR  INFLAMMATION - SCORE 1       - HEPATOCYTE BALLONING - SCORE 1 (FEW BALLOON CELLS)  - MILD PERICELLULAR FIBROSIS (STAGE 1A OF 4)   Past Medical History:  Diagnosis Date   Anemia    Ankle sprain 03/2017   Anxiety    Arthritis    Breast cancer ([place]) 2018   Breast cancer ([place]) 03/2017   Depression    Diabetes mellitus without complication ([place])    Fracture of distal end of radius 03/2017   left   Genetic testing 05/04/2017   Multi-Cancer panel (83 genes) @ [facility] - No pathogenic mutations detected   GERD (gastroesophageal reflux disease)    Headache    History of hiatal hernia    Hyperlipidemia    Hypothyroidism    Personal history of radiation therapy    Pneumonia    Pre-diabetes 03/2017   follows healthy diabetic diet   Sleep apnea    has not used cpap in a long time   Vitamin B 12 deficiency     Past Surgical History:  Procedure Laterality Date   [place]   when she had c-section   BREAST BIOPSY Left 03/27/2017   INVASIVE MAMMARY CARCINOMA Grade 1 UIG   BREAST LUMPECTOMY Left 04/13/2017   INVASIVE MAMMARY CARCINOMA/ DCIS, CLEAR MARGINS, NEG. LN   CESAREAN SECTION     COLONOSCOPY     COLONOSCOPY WITH PROPOFOL N/A 02/25/2020   Procedure: COLONOSCOPY WITH PROPOFOL;  Surgeon: [name], MD;  Location: [facility] ENDOSCOPY;  Service: Gastroenterology;  Laterality: N/A;   DILATION AND CURETTAGE OF UTERUS     ESOPHAGOGASTRODUODENOSCOPY (EGD) WITH PROPOFOL N/A 02/05/2015   Procedure: ESOPHAGOGASTRODUODENOSCOPY (EGD) WITH PROPOFOL;  Surgeon: Josefine Class, MD;  Location[facility] ENDOSCOPY;  Service: Endoscopy;  Laterality: N/A;   ESOPHAGOGASTRODUODENOSCOPY (EGD) WITH PROPOFOL N/A 02/25/2020   Procedure: ESOPHAGOGASTRODUODENOSCOPY (EGD) WITH PROPOFOL;  Surgeon: [name], MD;  Location: [facility] ENDOSCOPY;  Service: Gastroenterology;  Laterality: N/A;   PARTIAL MASTECTOMY WITH NEEDLE LOCALIZATION Left 04/13/2017    Procedure: PARTIAL MASTECTOMY WITH NEEDLE LOCALIZATION;  Surgeon: Leonie Green, MD;  Location: [facility] ORS;  Service: General;  Laterality: Left;   PATELLA FRACTURE SURGERY Left 1991   screws in place   SAVORY DILATION N/A 02/05/2015   Procedure: SAVORY DILATION;  Surgeon: Josefine Class, MD;  Location: [facility] ENDOSCOPY;  Service: Endoscopy;  Laterality: N/A;   SENTINEL NODE BIOPSY Left 04/13/2017   Procedure: SENTINEL NODE BIOPSY;  Surgeon: Leonie Green, MD;  Location: [facility] ORS;  Service: General;  Laterality: Left;   SINUS SURGERY WITH INSTATRAK     x 2   XI ROBOTIC ASSISTED PARAESOPHAGEAL HERNIA REPAIR N/A 08/18/2021   Procedure: XI ROBOTIC ASSISTED PARAESOPHAGEAL HERNIA REPAIR with RNFA to assist;  Surgeon: Jules Husbands, MD;  Location: [facility] ORS;  Service: General;  Laterality: N/A;    Current Outpatient Medications:    acetaminophen (TYLENOL) 500 MG tablet, Take 1,000 mg by mouth every 6 (six) hours as needed for moderate pain or headache., Disp: , Rfl:    amphetamine-dextroamphetamine (ADDERALL XR) 10 MG 24 hr capsule, Take 10 mg by mouth daily., Disp: , Rfl:    amphetamine-dextroamphetamine (ADDERALL) 20 MG tablet,  Take 20 mg by mouth every morning., Disp: , Rfl:    anastrozole (ARIMIDEX) 1 MG tablet, TAKE 1 TABLET BY MOUTH EVERY DAY, Disp: 90 tablet, Rfl: 0   ARIPiprazole (ABILIFY) 10 MG tablet, Take by mouth., Disp: , Rfl:    ARIPiprazole (ABILIFY) 15 MG tablet, Take 15 mg by mouth daily., Disp: , Rfl:    atorvastatin (LIPITOR) 40 MG tablet, Take 40 mg by mouth daily. , Disp: , Rfl:    BANOPHEN 25 MG capsule, SMARTSIG:2 pill By Mouth, Disp: , Rfl:    calcium carbonate (OSCAL) 1500 (600 Ca) MG TABS tablet, Take by mouth 2 (two) times daily with a meal., Disp: , Rfl:    darifenacin (ENABLEX) 15 MG 24 hr tablet, TK 1 T PO ONCE D FOR OVERACTIVE BLADDER., Disp: , Rfl:    fluticasone (FLONASE) 50 MCG/ACT nasal spray, Place 2 sprays into both nostrils daily as needed for  allergies., Disp: , Rfl:    levocetirizine (XYZAL) 5 MG tablet, Take 5 mg by mouth every evening. , Disp: , Rfl:    levothyroxine (SYNTHROID) 150 MCG tablet, Take by mouth., Disp: , Rfl:    lipase/protease/amylase (CREON) 36000 UNITS CPEP capsule, Take 2 capsules with the first bite of each meal and 1 capsule with the first bite of each snack, Disp: 240 capsule, Rfl: 3   LORazepam (ATIVAN) 0.5 MG tablet, Take 0.5 mg by mouth 3 (three) times daily., Disp: , Rfl:    metFORMIN (GLUCOPHAGE-XR) 500 MG 24 hr tablet, Take 1,000 mg by mouth daily with breakfast., Disp: , Rfl:    montelukast (SINGULAIR) 10 MG tablet, , Disp: , Rfl:    mupirocin ointment (BACTROBAN) 2 %, Apply 1 application topically 3 (three) times daily as needed., Disp: , Rfl:    ondansetron (ZOFRAN-ODT) 4 MG disintegrating tablet, Take 1 tablet (4 mg total) by mouth every 6 (six) hours as needed for nausea., Disp: 20 tablet, Rfl: 0   oxybutynin (DITROPAN-XL) 5 MG 24 hr tablet, Take 1 tablet by mouth daily., Disp: , Rfl:    traZODone (DESYREL) 150 MG tablet, Take 150 mg by mouth at bedtime., Disp: , Rfl:    vitamin B-12 (CYANOCOBALAMIN) 1000 MCG tablet, Take 1,000 mcg by mouth daily., Disp: , Rfl:    vortioxetine HBr (TRINTELLIX) 20 MG TABS tablet, Take 20 mg by mouth daily., Disp: , Rfl:    Family History  Problem Relation Age of Onset   Breast cancer Maternal Aunt 70       deceased 54s   Breast cancer Cousin 80       daughter of mat aunt with breast cancer   Breast cancer Cousin 18       daughter of mat aunt with breast cancer   Deep vein thrombosis Mother    Heart disease Mother    Stroke Mother    Heart disease Father    Parkinson's disease Father    Heart disease Sister    Stroke Sister    Skin cancer Brother    Heart disease Brother    Alzheimer's disease Brother    Lung cancer Paternal Uncle        deceased 75s   Heart disease Brother    Alzheimer's disease Brother    Heart disease Brother    Alzheimer's disease  Brother    Cancer Brother        unclear primary; currently 54s   Cirrhosis Sister    Alzheimer's disease Sister    Kidney cancer Son  17       nephrectomy @ [facility]     Social History   Tobacco Use   Smoking status: Never    Passive exposure: Past   Smokeless tobacco: Never  Vaping Use   Vaping Use: Never used  Substance Use Topics   Alcohol use: No   Drug use: No    Allergies as of 12/08/2021 - Review Complete 12/08/2021  Allergen Reaction Noted   Amoxicillin Hives, Itching, and Other (See Comments) 12/19/2011   Erythromycin Hives and Itching 12/19/2011   Sulfa antibiotics Hives and Itching 12/19/2011   Contrast media [iodinated contrast media] Rash 02/04/2015   Morphine Nausea And Vomiting and Other (See Comments) 12/19/2011    Review of Systems:    All systems reviewed and negative except where noted in HPI.   Physical Exam:  BP 125/82 (BP Location: Left Arm, Patient Position: Sitting, Cuff Size: Normal)   Pulse 84   Temp 98.3 F (36.8 C) (Oral)   Ht 5' 7.5" (1.715 m)   Wt 162 lb 6 oz (73.7 kg)   BMI 25.06 kg/m  No LMP recorded. Patient has had a hysterectomy.  General:   Alert,  Well-developed, well-nourished, pleasant and cooperative in NAD Head:  Normocephalic and atraumatic. Eyes:  Sclera clear, no icterus.   Conjunctiva pink. Ears:  Normal auditory acuity. Nose:  No deformity, discharge, or lesions. Mouth:  No deformity or lesions,oropharynx pink & moist. Neck:  Supple; no masses or thyromegaly. Lungs:  Respirations even and unlabored.  Clear throughout to auscultation.   No wheezes, crackles, or rhonchi. No acute distress. Heart:  Regular rate and rhythm; no murmurs, clicks, rubs, or gallops. Abdomen:  Normal bowel sounds. Soft, obese, non-tender and non-distended without masses, hepatosplenomegaly or hernias noted.  No guarding or rebound tenderness.   Rectal: Not performed Msk:  Symmetrical without gross deformities. Good, equal movement & strength  bilaterally. Pulses:  Normal pulses noted. Extremities:  No clubbing or edema.  No cyanosis. Neurologic:  Alert and oriented x3;  grossly normal neurologically. Skin:  Intact without significant lesions or rashes. No jaundice. Psych:  Alert and cooperative. Normal mood and affect.  Imaging Studies: Reviewed  Assessment and Plan:   April Leblanc is a 71 y.o. Caucasian female with stage I left breast cancer maintained on Arimidex, metabolic syndrome, obesity, hypothyroidism, diabetes, chronic GERD, fatty liver is seen in consultation for chronic nonbloody diarrhea since end of April 2023  Chronic nonbloody diarrhea along with abdominal bloating, weight loss Secondary to exocrine pancreatic insufficiency based on low pancreatic fecal elastase levels Stool studies negative for infection including C. Difficile Normal calprotectin levels Celiac disease panel negative CT abdomen pelvis with contrast unremarkable Continue Creon 36 K 2 to 3 capsules with each meal and 1 capsule with snack  Tubular adenomas of the colon Recommend surveillance colonoscopy in 01/2027   Follow up in 6 months   April Darby, MD

## 2021-12-09 ENCOUNTER — Other Ambulatory Visit: Payer: Self-pay | Admitting: Gastroenterology

## 2022-01-03 ENCOUNTER — Telehealth: Payer: Self-pay

## 2022-01-03 NOTE — Telephone Encounter (Signed)
Patient is calling because she states the first month on Creon was 40 something dollars. She state she just tried to get it refilled and now she is in the doughnut whole and it is 700 something dollars. She is asking if there is anything else she can do. Informed patient we can apply for patient assistance to see if she can get the medication for free. She states she will come and sign the forms and bring W-2 and how much she makes a month

## 2022-02-24 ENCOUNTER — Other Ambulatory Visit: Payer: Self-pay | Admitting: Oncology

## 2022-03-06 ENCOUNTER — Other Ambulatory Visit: Payer: Self-pay

## 2022-03-06 MED ORDER — PANCRELIPASE (LIP-PROT-AMYL) 36000-114000 UNITS PO CPEP: ORAL_CAPSULE | ORAL | 3 refills | Status: DC

## 2022-03-08 ENCOUNTER — Encounter: Payer: Self-pay | Admitting: Surgery

## 2022-03-08 ENCOUNTER — Ambulatory Visit (INDEPENDENT_AMBULATORY_CARE_PROVIDER_SITE_OTHER): Payer: Medicare Other | Admitting: Surgery

## 2022-03-08 VITALS — BP 125/80 | HR 86 | Temp 98.0°F | Ht 67.5 in | Wt 158.0 lb

## 2022-03-08 DIAGNOSIS — K449 Diaphragmatic hernia without obstruction or gangrene: Secondary | ICD-10-CM | POA: Diagnosis not present

## 2022-03-08 DIAGNOSIS — Z09 Encounter for follow-up examination after completed treatment for conditions other than malignant neoplasm: Secondary | ICD-10-CM

## 2022-03-08 DIAGNOSIS — R197 Diarrhea, unspecified: Secondary | ICD-10-CM

## 2022-03-08 NOTE — Patient Instructions (Signed)
   Follow-up with our office as needed.  Please call and ask to speak with a nurse if you develop questions or concerns.  

## 2022-03-10 NOTE — Progress Notes (Signed)
Outpatient Surgical Follow Up  03/10/2022  April Leblanc is an 71 y.o. female.   Chief Complaint  Patient presents with   Follow-up    HPI: April Leblanc is a 71 year old female status post robotic paraesophageal hernia repair .  She reports no reflux.  Significant improvement in symptoms.  Chest pain or cough.  Diarrhea has subsided/   No evidence of complications from the surgery   Past Medical History:  Diagnosis Date   Anemia    Ankle sprain 03/2017   Anxiety    Arthritis    Breast cancer ([place]) 2018   Breast cancer ([place]) 03/2017   Depression    Diabetes mellitus without complication ([place])    Fracture of distal end of radius 03/2017   left   Genetic testing 05/04/2017   Multi-Cancer panel (83 genes) @ [facility] - No pathogenic mutations detected   GERD (gastroesophageal reflux disease)    Headache    History of hiatal hernia    Hyperlipidemia    Hypothyroidism    Personal history of radiation therapy    Pneumonia    Pre-diabetes 03/2017   follows healthy diabetic diet   Sleep apnea    has not used cpap in a long time   Vitamin B 12 deficiency     Past Surgical History:  Procedure Laterality Date   [place]   when she had c-section   BREAST BIOPSY Left 03/27/2017   INVASIVE MAMMARY CARCINOMA Grade 1 UIG   BREAST LUMPECTOMY Left 04/13/2017   INVASIVE MAMMARY CARCINOMA/ DCIS, CLEAR MARGINS, 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   CESAREAN SECTION     COLONOSCOPY     COLONOSCOPY WITH PROPOFOL N/A 02/25/2020   Procedure: COLONOSCOPY WITH PROPOFOL;  Surgeon: [name], MD;  Location: [facility] ENDOSCOPY;  Service: Gastroenterology;  Laterality: N/A;   DILATION AND CURETTAGE OF UTERUS     ESOPHAGOGASTRODUODENOSCOPY (EGD) WITH PROPOFOL N/A 02/05/2015   Procedure: ESOPHAGOGASTRODUODENOSCOPY (EGD) WITH PROPOFOL;  Surgeon: Josefine Class, MD;  Location: [facility] ENDOSCOPY;  Service: Endoscopy;  Laterality: N/A;   ESOPHAGOGASTRODUODENOSCOPY (EGD) WITH  PROPOFOL N/A 02/25/2020   Procedure: ESOPHAGOGASTRODUODENOSCOPY (EGD) WITH PROPOFOL;  Surgeon: [name], MD;  Location: [facility] ENDOSCOPY;  Service: Gastroenterology;  Laterality: N/A;   PARTIAL MASTECTOMY WITH NEEDLE LOCALIZATION Left 04/13/2017   Procedure: PARTIAL MASTECTOMY WITH NEEDLE LOCALIZATION;  Surgeon: Leonie Green, MD;  Location: [facility] ORS;  Service: General;  Laterality: Left;   PATELLA FRACTURE SURGERY Left 1991   screws in place   SAVORY DILATION N/A 02/05/2015   Procedure: SAVORY DILATION;  Surgeon: Josefine Class, MD;  Location: [facility] ENDOSCOPY;  Service: Endoscopy;  Laterality: N/A;   SENTINEL NODE BIOPSY Left 04/13/2017   Procedure: SENTINEL NODE BIOPSY;  Surgeon: Leonie Green, MD;  Location: [facility] ORS;  Service: General;  Laterality: Left;   SINUS SURGERY WITH INSTATRAK     x 2   XI ROBOTIC ASSISTED PARAESOPHAGEAL HERNIA REPAIR N/A 08/18/2021   Procedure: XI ROBOTIC ASSISTED PARAESOPHAGEAL HERNIA REPAIR with RNFA to assist;  Surgeon: Jules Husbands, MD;  Location: [facility] ORS;  Service: General;  Laterality: N/A;    Family History  Problem Relation Age of Onset   Breast cancer Maternal Aunt 70       deceased 13s   Breast cancer Cousin 25       daughter of mat aunt with breast cancer   Breast cancer Cousin 32       daughter of  mat aunt with breast cancer   Deep vein thrombosis Mother    Heart disease Mother    Stroke Mother    Heart disease Father    Parkinson's disease Father    Heart disease Sister    Stroke Sister    Skin cancer Brother    Heart disease Brother    Alzheimer's disease Brother    Lung cancer Paternal Uncle        deceased 3s   Heart disease Brother    Alzheimer's disease Brother    Heart disease Brother    Alzheimer's disease Brother    Cancer Brother        unclear primary; currently 110s   Cirrhosis Sister    Alzheimer's disease Sister    Kidney cancer Son 79       nephrectomy @ [facility]    Social History:   reports that she has never smoked. She has been exposed to tobacco smoke. She has never used smokeless tobacco. She reports that she does not drink alcohol and does not use drugs.  Allergies:  Allergies  Allergen Reactions   Amoxicillin Hives, Itching and Other (See Comments)    Has patient had a PCN reaction causing immediate rash, facial/tongue/throat swelling, SOB or lightheadedness with hypotension: No Has patient had a PCN reaction causing severe rash involving mucus membranes or skin necrosis: No Has patient had a PCN reaction that required hospitalization: No Has patient had a PCN reaction occurring within the last 10 years: No If all of the above answers are "NO", then may proceed with Cephalosporin use.    Erythromycin Hives and Itching   Sulfa Antibiotics Hives and Itching   Contrast Media [Iodinated Contrast Media] Rash    Red rash and itching. Topical iodine not a problem.   Morphine Nausea And Vomiting and Other (See Comments)    Pretty severe vomiting. Can take hydrocodone and oxycodone      Medications reviewed.    ROS Full ROS performed and is otherwise negative other than what is stated in HPI   BP 125/80   Pulse 86   Temp 98 F (36.7 C)   Ht 5' 7.5" (1.715 m)   Wt 158 lb (71.7 kg)   SpO2 98%   BMI 24.38 kg/m   Physical Exam  Physical Exam General:   Alert,  Well-developed, well-nourished, pleasant and cooperative in NAD Head:  Normocephalic and atraumatic. Eyes:  Sclera clear, no icterus.   Conjunctiva pink. Ears:  Normal auditory acuity. Nose:  No deformity, discharge, or lesions. Mouth:  No deformity or lesions,oropharynx pink & moist. Neck:  Supple; no masses or thyromegaly. Lungs:  Respirations even and unlabored.  Clear throughout to auscultation.   No wheezes, crackles, or rhonchi. No acute distress. Heart:  Regular rate and rhythm; no murmurs, clicks, rubs, or gallops. Abdomen:  Normal bowel sounds.  Robotic scars healed no infection no  hernias soft, , non-tender and non-distended without masses, hepatosplenomegaly or hernias noted.  No guarding or rebound tenderness.   Rectal: Not performed Msk:  Symmetrical without gross deformities. Good, equal movement & strength bilaterally. Pulses:  Normal pulses noted. Extremities:  No clubbing or edema.  No cyanosis. Neurologic:  Alert and oriented x3;  grossly normal neurologically. Skin:  Intact without significant lesions or rashes. No jaundice. Psych:  Alert and cooperative. Normal mood and affect       Assessment/Plan: 71 year old female doing very well after paraesophageal hernia repair.  Diarrhea has completely resolved.  No evidence  of reflux and significant improvement in pulmonary symptoms.  No evidence of surgical complications.  Follow-up as needed  I spent 20 minutes in this encounter including personally reviewing imaging studies, coordinating her care, placing orders and performing appropriate documentation   Caroleen Hamman, MD [place] Surgeon

## 2022-03-13 NOTE — Telephone Encounter (Signed)
[employer] has received application they are doing the benefits investigation now

## 2022-03-15 NOTE — Telephone Encounter (Signed)
Patient has been approved for [employer] through 05/01/23. Called and informed patient of this information.

## 2022-05-04 ENCOUNTER — Ambulatory Visit
Admission: RE | Admit: 2022-05-04 | Discharge: 2022-05-04 | Disposition: A | Discharge status: Home / Self Care | Payer: Medicare Other | Source: Ambulatory Visit | Attending: Oncology | Admitting: Oncology

## 2022-05-04 DIAGNOSIS — Z79811 Long term (current) use of aromatase inhibitors: Secondary | ICD-10-CM | POA: Diagnosis present

## 2022-05-04 DIAGNOSIS — Z5181 Encounter for therapeutic drug level monitoring: Secondary | ICD-10-CM | POA: Diagnosis not present

## 2022-05-15 ENCOUNTER — Inpatient Hospital Stay: Payer: Medicare Other | Attending: Oncology | Admitting: Medical Oncology

## 2022-05-15 ENCOUNTER — Ambulatory Visit: Payer: Medicare Other | Admitting: Oncology

## 2022-05-15 ENCOUNTER — Encounter: Payer: Self-pay | Admitting: Medical Oncology

## 2022-05-15 VITALS — BP 141/75 | HR 62 | Temp 97.5°F | Resp 16 | Ht 67.5 in | Wt 157.0 lb

## 2022-05-15 DIAGNOSIS — Z1231 Encounter for screening mammogram for malignant neoplasm of breast: Secondary | ICD-10-CM

## 2022-05-15 DIAGNOSIS — Z808 Family history of malignant neoplasm of other organs or systems: Secondary | ICD-10-CM | POA: Diagnosis not present

## 2022-05-15 DIAGNOSIS — Z79811 Long term (current) use of aromatase inhibitors: Secondary | ICD-10-CM | POA: Diagnosis not present

## 2022-05-15 DIAGNOSIS — Z801 Family history of malignant neoplasm of trachea, bronchus and lung: Secondary | ICD-10-CM | POA: Insufficient documentation

## 2022-05-15 DIAGNOSIS — E119 Type 2 diabetes mellitus without complications: Secondary | ICD-10-CM | POA: Diagnosis not present

## 2022-05-15 DIAGNOSIS — Z90710 Acquired absence of both cervix and uterus: Secondary | ICD-10-CM | POA: Insufficient documentation

## 2022-05-15 DIAGNOSIS — Z8052 Family history of malignant neoplasm of bladder: Secondary | ICD-10-CM | POA: Insufficient documentation

## 2022-05-15 DIAGNOSIS — Z170 Estrogen receptor positive status [ER+]: Secondary | ICD-10-CM | POA: Diagnosis not present

## 2022-05-15 DIAGNOSIS — M8580 Other specified disorders of bone density and structure, unspecified site: Secondary | ICD-10-CM | POA: Insufficient documentation

## 2022-05-15 DIAGNOSIS — C50412 Malignant neoplasm of upper-outer quadrant of left female breast: Secondary | ICD-10-CM | POA: Diagnosis not present

## 2022-05-15 DIAGNOSIS — Z803 Family history of malignant neoplasm of breast: Secondary | ICD-10-CM | POA: Insufficient documentation

## 2022-05-15 DIAGNOSIS — Z5181 Encounter for therapeutic drug level monitoring: Secondary | ICD-10-CM | POA: Diagnosis not present

## 2022-05-15 NOTE — Progress Notes (Unsigned)
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  6 mm tumor with negative lymph nodes that was ER/PR positive and negative. Patient did not want adjuvant chemotherapy and went on to receive adjuvant radiation treatment and started taking Arimidex in march 2019. She gets reclast for osteopenia Q18 months    Interval history-  Her chronic diarrhea of unknown origin has resolved per patient. She denies any constipation, abdominal pain.   Tolerating Arimidex well without any significant side effects. No night sweats, vaginal dryness, no pain with intercourse (not sexually active per patient).   Recently had a DEXA scan on 05/01/2022 with a T score of -2.3 (osteopenia). Her last DEXA was on 11/24/2019 and showed a T score of -1.7. She is taking 1200 mg calcium daily and 1,000 IU vitamin D. She is not completing weight bearing exercises but hopes to return to doing this soon.  She reports that she stopped as her grand child moved into her home and her treadmill and exercise equipment were not accessible- now they are.    ECOG PS- 1 Pain scale- 0   Review of systems- Review of Systems  Constitutional:  Negative for chills, fever, malaise/fatigue and weight loss.  HENT:  Negative for congestion, ear  discharge and nosebleeds.   Eyes:  Negative for blurred vision.  Respiratory:  Negative for cough, hemoptysis, sputum production, shortness of breath and wheezing.   Cardiovascular:  Negative for chest pain, palpitations, orthopnea and claudication.  Gastrointestinal:  Negative for abdominal pain, blood in stool, constipation, diarrhea, heartburn, melena, nausea and vomiting.  Genitourinary:  Negative for dysuria, flank pain, frequency, hematuria and urgency.  Musculoskeletal:  Negative for back pain, joint pain and myalgias.  Skin:  Negative for rash.  Neurological:  Negative for dizziness, tingling, focal weakness, seizures, weakness and headaches.  Endo/Heme/Allergies:  Does not bruise/bleed easily.  Psychiatric/Behavioral:  Negative for depression and suicidal ideas. The patient does not have insomnia.       Allergies  Allergen Reactions   Amoxicillin Hives, Itching and Other (See Comments)    Has patient had a PCN reaction causing immediate rash, facial/tongue/throat swelling, SOB or lightheadedness with hypotension: No Has patient had a PCN reaction causing severe rash involving mucus membranes or skin necrosis: No Has patient had a PCN reaction that required hospitalization: No Has patient had a PCN reaction occurring within the last 10 years: No If all of the above answers are "NO", then may proceed with Cephalosporin use.    Erythromycin Hives and Itching   Sulfa Antibiotics Hives and Itching   Contrast Media [Iodinated Contrast Media] Rash    Red rash and itching. Topical iodine not a problem.   Morphine Nausea And Vomiting and Other (See Comments)    Pretty severe vomiting. Can take hydrocodone and oxycodone  Past Medical History:  Diagnosis Date   Anemia    Ankle sprain 03/2017   Anxiety    Arthritis    Breast cancer ([place]) 2018   Breast cancer ([place]) 03/2017   Depression    Diabetes mellitus without complication ([place])    Fracture of distal end of radius  03/2017   left   Genetic testing 05/04/2017   Multi-Cancer panel (83 genes) @ [facility] - No pathogenic mutations detected   GERD (gastroesophageal reflux disease)    Headache    History of hiatal hernia    Hyperlipidemia    Hypothyroidism    Personal history of radiation therapy    Pneumonia    Pre-diabetes 03/2017   follows healthy diabetic diet   Sleep apnea    has not used cpap in a long time   Vitamin B 12 deficiency      Past Surgical History:  Procedure Laterality Date   [place]   when she had c-section   BREAST BIOPSY Left 03/27/2017   INVASIVE MAMMARY CARCINOMA Grade 1 UIG   BREAST LUMPECTOMY Left 04/13/2017   INVASIVE MAMMARY CARCINOMA/ DCIS, CLEAR MARGINS, NEG. LN   CESAREAN SECTION     COLONOSCOPY     COLONOSCOPY WITH PROPOFOL N/A 02/25/2020   Procedure: COLONOSCOPY WITH PROPOFOL;  Surgeon: [name], MD;  Location: [facility] ENDOSCOPY;  Service: Gastroenterology;  Laterality: N/A;   DILATION AND CURETTAGE OF UTERUS     ESOPHAGOGASTRODUODENOSCOPY (EGD) WITH PROPOFOL N/A 02/05/2015   Procedure: ESOPHAGOGASTRODUODENOSCOPY (EGD) WITH PROPOFOL;  Surgeon: Josefine Class, MD;  [facility];  Service: Endoscopy;  Laterality: N/A;   ESOPHAGOGASTRODUODENOSCOPY (EGD) WITH PROPOFOL N/A 02/25/2020   Procedure: ESOPHAGOGASTRODUODENOSCOPY (EGD) WITH PROPOFOL;  Surgeon: [name], MD;  Location: [facility] ENDOSCOPY;  Service: Gastroenterology;  Laterality: N/A;   PARTIAL MASTECTOMY WITH NEEDLE LOCALIZATION Left 04/13/2017   Procedure: PARTIAL MASTECTOMY WITH NEEDLE LOCALIZATION;  Surgeon: Leonie Green, MD;  Location: [facility] ORS;  Service: General;  Laterality: Left;   PATELLA FRACTURE SURGERY Left 1991   screws in place   SAVORY DILATION N/A 02/05/2015   Procedure: SAVORY DILATION;  Surgeon: Josefine Class, MD;  Location: [facility] ENDOSCOPY;  Service: Endoscopy;  Laterality: N/A;   SENTINEL NODE BIOPSY  Left 04/13/2017   Procedure: SENTINEL NODE BIOPSY;  Surgeon: Leonie Green, MD;  Location: [facility] ORS;  Service: General;  Laterality: Left;   SINUS SURGERY WITH INSTATRAK     x 2   XI ROBOTIC ASSISTED PARAESOPHAGEAL HERNIA REPAIR N/A 08/18/2021   Procedure: XI ROBOTIC ASSISTED PARAESOPHAGEAL HERNIA REPAIR with RNFA to assist;  Surgeon: Jules Husbands, MD;  Location: [facility] ORS;  Service: General;  Laterality: N/A;    Social History   Socioeconomic History   Marital status: Married    Spouse name: John   Number of children: Not on file   Years of education: Not on file   Highest education level: Not on file  Occupational History   Not on file  Tobacco Use   Smoking status: Never    Passive exposure: Past   Smokeless tobacco: Never  Vaping Use   Vaping Use: Never used  Substance and Sexual Activity   Alcohol use: No   Drug use: No   Sexual activity: Not Currently  Other Topics Concern   Not on file  Social History Narrative   2 grandchildren lives with them, they have full custody   Social Determinants of  Health   Financial Resource Strain: Not on file  Food Insecurity: Not on file  Transportation Needs: Not on file  Physical Activity: Not on file  Stress: Not on file  Social Connections: Not on file  Intimate Partner Violence: Not on file    Family History  Problem Relation Age of Onset   Breast cancer Maternal Aunt 70       deceased 51s   Breast cancer Cousin 28       daughter of mat aunt with breast cancer   Breast cancer Cousin 84       daughter of mat aunt with breast cancer   Deep vein thrombosis Mother    Heart disease Mother    Stroke Mother    Heart disease Father    Parkinson's disease Father    Heart disease Sister    Stroke Sister    Skin cancer Brother    Heart disease Brother    Alzheimer's disease Brother    Lung cancer Paternal Uncle        deceased 44s   Heart disease Brother    Alzheimer's disease Brother    Heart disease Brother     Alzheimer's disease Brother    Cancer Brother        unclear primary; currently 23s   Cirrhosis Sister    Alzheimer's disease Sister    Kidney cancer Son 37       nephrectomy @ [facility]     Current Outpatient Medications:    acetaminophen (TYLENOL) 500 MG tablet, Take 1,000 mg by mouth every 6 (six) hours as needed for moderate pain or headache., Disp: , Rfl:    amphetamine-dextroamphetamine (ADDERALL XR) 10 MG 24 hr capsule, Take 10 mg by mouth daily., Disp: , Rfl:    amphetamine-dextroamphetamine (ADDERALL) 20 MG tablet, Take 20 mg by mouth every morning., Disp: , Rfl:    anastrozole (ARIMIDEX) 1 MG tablet, TAKE 1 TABLET BY MOUTH EVERY DAY, Disp: 90 tablet, Rfl: 0   ARIPiprazole (ABILIFY) 10 MG tablet, Take by mouth., Disp: , Rfl:    ARIPiprazole (ABILIFY) 15 MG tablet, Take 15 mg by mouth daily., Disp: , Rfl:    atorvastatin (LIPITOR) 40 MG tablet, Take 40 mg by mouth daily. , Disp: , Rfl:    BANOPHEN 25 MG capsule, SMARTSIG:2 pill By Mouth, Disp: , Rfl:    calcium carbonate (OSCAL) 1500 (600 Ca) MG TABS tablet, Take by mouth 2 (two) times daily with a meal., Disp: , Rfl:    darifenacin (ENABLEX) 15 MG 24 hr tablet, TK 1 T PO ONCE D FOR OVERACTIVE BLADDER., Disp: , Rfl:    fluticasone (FLONASE) 50 MCG/ACT nasal spray, Place 2 sprays into both nostrils daily as needed for allergies., Disp: , Rfl:    levocetirizine (XYZAL) 5 MG tablet, Take 5 mg by mouth every evening. , Disp: , Rfl:    levothyroxine (SYNTHROID) 150 MCG tablet, Take by mouth., Disp: , Rfl:    lipase/protease/amylase (CREON) 36000 UNITS CPEP capsule, Take 2 capsules with the first bite of each meal and 1 capsule with the first bite of each snack, Disp: 720 capsule, Rfl: 3   LORazepam (ATIVAN) 0.5 MG tablet, Take 0.5 mg by mouth 3 (three) times daily., Disp: , Rfl:    metFORMIN (GLUCOPHAGE-XR) 500 MG 24 hr tablet, Take 1,000 mg by mouth daily with breakfast., Disp: , Rfl:    montelukast (SINGULAIR) 10 MG tablet, , Disp: ,  Rfl:    mupirocin ointment (BACTROBAN) 2 %,  Apply 1 application topically 3 (three) times daily as needed., Disp: , Rfl:    ondansetron (ZOFRAN-ODT) 4 MG disintegrating tablet, Take 1 tablet (4 mg total) by mouth every 6 (six) hours as needed for nausea., Disp: 20 tablet, Rfl: 0   oxybutynin (DITROPAN-XL) 5 MG 24 hr tablet, Take 1 tablet by mouth daily., Disp: , Rfl:    traZODone (DESYREL) 150 MG tablet, Take 150 mg by mouth at bedtime., Disp: , Rfl:    vitamin B-12 (CYANOCOBALAMIN) 1000 MCG tablet, Take 1,000 mcg by mouth daily., Disp: , Rfl:    vortioxetine HBr (TRINTELLIX) 20 MG TABS tablet, Take 20 mg by mouth daily., Disp: , Rfl:   Physical exam:  Vitals:   05/15/22 1033 05/15/22 1043  BP: (!) 141/75   Pulse: (!) 53 62  Resp: 16   Temp: (!) 97.5 F (36.4 C)   TempSrc: Tympanic   SpO2: 99%   Weight: 157 lb (71.2 kg)   Height: 5' 7.5" (1.715 m)    Physical Exam Constitutional:      General: She is not in acute distress. Cardiovascular:     Rate and Rhythm: Normal rate and regular rhythm.     Heart sounds: Normal heart sounds.  Pulmonary:     Effort: Pulmonary effort is normal.     Breath sounds: Normal breath sounds.  Skin:    General: Skin is warm and dry.  Neurological:     Mental Status: She is alert and oriented to person, place, and time.   Breast exam was performed in lying down position. Patient is status post left lumpectomy with a well-healed surgical scar. No evidence of any palpable masses. No evidence of axillary adenopathy. No evidence of any palpable masses or lumps in the right breast. No evidence of right axillary adenopathy       Latest Ref Rng & Units 08/19/2021    6:09 AM  CMP  Glucose 70 - 99 mg/dL 121   BUN 8 - 23 mg/dL 15   Creatinine 0.44 - 1.00 mg/dL 0.75   Sodium 135 - 145 mmol/L 137   Potassium 3.5 - 5.1 mmol/L 4.2   Chloride 98 - 111 mmol/L 106   CO2 22 - 32 mmol/L 24   Calcium 8.9 - 10.3 mg/dL 8.0       Latest Ref Rng & Units  08/19/2021    6:09 AM  CBC  WBC 4.0 - 10.5 K/uL 13.0   Hemoglobin 12.0 - 15.0 g/dL 11.8   Hematocrit 36.0 - 46.0 % 37.5   Platelets 150 - 400 K/uL 228     No images are attached to the encounter.  DG Bone Density  Result Date: 05/04/2022 EXAM: DUAL X-RAY ABSORPTIOMETRY (DXA) FOR BONE MINERAL DENSITY IMPRESSION: Patient:             Elmyra, Banwart Birth Date:          1951-02-10    [age] / Weight:     66.0 in.    158.0 lbs. Sex / Ethnic:        Female    White Facility ID: Referring Physician: Dr Janese Banks Measured:            05/04/2022 10:24:25 AM (14.10) Analyzed:            05/04/2022 10:25:41 AM (14.10) Left Forearm Bone Density Trend BMD     Young-Adult Age-Matched Region       (g/cm2) T-score     Z-score  WHO Classification Radius UD    0.244   -4.9        -2.9         - Ulna UD      0.226   -           -            - Radius 33%   0.671   -2.3        -0.4        Osteopenia Ulna 33%     0.681   -           -            - Both UD      0.237   -           -            - Both 33%     0.676   -           -            - Radius Total 0.459   -3.6        -1.6         - Ulna Total   0.424   -           -            - Both Total   0.444   -           -            - Trend: Radius 33% Change vs Change vs Measured   Age     BMD     Previous  Previous Date       (years) (g/cm2) (g/cm2)   (%) 05/04/2022 71.2    0.671   -0.019    -2.8 05/20/2018 67.3    0.690   -         - Dear Dr Janese Banks, Your patient Lenon Ahmadi completed a FRAX assessment on 05/04/2022 using the [place] (analysis version: 14.10) manufactured by [employer]. The following summarizes the results of our evaluation. PATIENT BIOGRAPHICAL: Name: Sima, Lindenberger Patient ID: [id number] Birth Date: Oct 31, 1950 Height:    66.0 in. Gender:     Female    Age:        71.2       Weight:    158.0 lbs. Ethnicity:  White                            Exam Date: 05/04/2022 FRAX* RESULTS:  (version: 3.5) 10-year Probability of  Fracture1 Major Osteoporotic Fracture2 Hip Fracture 16.6% 2.6% Population: [place] (Caucasian) Risk Factors: History of Fracture (Adult) Based on Femur (Left) Neck BMD 1 -The 10-year probability of fracture may be lower than reported if the patient has received treatment. 2 -Major Osteoporotic Fracture: Clinical Spine, Forearm, Hip or Shoulder *FRAX is a Materials engineer of the [employer] for Metabolic Bone Disease, a [place] (WHO) [facility]. ASSESSMENT: The probability of a major osteoporotic fracture is 16.6% within the next ten years. The probability of a hip fracture is 2.6% within the next ten years. Your patient Taviana Westergren completed a BMD test on 05/04/2022 using the [place] (software version: 14.10) manufactured by [employer]. The following summarizes the results of our evaluation. Technologist: MTB PATIENT  BIOGRAPHICAL: Name: Lillyauna, Jenkinson Patient ID: [id number] Birth Date: [birth year] Height: 66.0 in. Gender: Female Exam Date: 05/04/2022 Weight: 158.0 lbs. Indications: Advanced Age, Breast CA, Caucasian, Height Loss, History of Breast Cancer, History of Fracture (Adult), History of Osteoporosis, Hypothyroid, Hysterectomy, Osteopenia, Postmenopausal Fractures: Left wrist, Left knee, Clavicle Treatments: Arimidex, Calcium, Flonase, Singulair, Synthroid, Vitamin D DENSITOMETRY RESULTS: Site         Region     Measured Date Measured Age WHO Classification Young Adult T-score BMD         %Change vs. Previous Significant Change (*) AP Spine L1-L4 05/04/2022 71.2 Osteopenia -1.6 0.999 g/cm2 -0.6% - AP Spine L1-L4 11/24/2019 68.8 Osteopenia -1.5 1.005 g/cm2 4.6% Yes AP Spine L1-L4 05/20/2018 67.3 Osteopenia -1.9 0.961 g/cm2 -0.7% - AP Spine L1-L4 05/17/2017 66.3 Osteopenia -1.8 0.968 g/cm2 - - DualFemur Neck Left 05/04/2022 71.2 Osteopenia -1.6 0.822 g/cm2 2.0% - DualFemur Neck Left 11/24/2019 68.8 Osteopenia -1.7 0.806 g/cm2  3.3% - DualFemur Neck Left 05/20/2018 67.3 Osteopenia -1.9 0.780 g/cm2 0.5% - DualFemur Neck Left 05/17/2017 66.3 Osteopenia -1.9 0.776 g/cm2 - - DualFemur Total Mean 05/04/2022 71.2 Normal -0.9 0.893 g/cm2 0.4% - DualFemur Total Mean 11/24/2019 68.8 Normal -0.9 0.889 g/cm2 3.5% Yes DualFemur Total Mean 05/20/2018 67.3 Osteopenia -1.2 0.859 g/cm2 0.7% - DualFemur Total Mean 05/17/2017 66.3 Osteopenia -1.2 0.853 g/cm2 - - Left Forearm Radius 33% 05/04/2022 71.2 Osteopenia -2.3 0.671 g/cm2 -2.8% - Left Forearm Radius 33% 05/20/2018 67.3 Osteopenia -2.1 0.690 g/cm2 - - ASSESSMENT: The BMD measured at Forearm Radius 33% is 0.671 g/cm2 with a T-score of -2.3. This patient is considered osteopenic according to [facility]) criteria. The scan quality is good. Compared with prior study, there has been no significant change in the spine. Compared with prior study, there has been no significant change in the total hip. World Pharmacologist [facility]) criteria for post-menopausal, Caucasian Women: Normal:                   T-score at or above -1 SD Osteopenia/low bone mass: T-score between -1 and -2.5 SD Osteoporosis:             T-score at or below -2.5 SD RECOMMENDATIONS: 1. All patients should optimize calcium and vitamin D intake. 2. Consider FDA-approved medical therapies in postmenopausal women and men aged 60 years and older, based on the following: a. A hip or vertebral(clinical or morphometric) fracture b. T-score < -2.5 at the femoral neck or spine after appropriate evaluation to exclude secondary causes c. Low bone mass (T-score between -1.0 and -2.5 at the femoral neck or spine) and a 10-year probability of a hip fracture > 3% or a 10-year probability of a major osteoporosis-related fracture > 20% based on the [place]-adapted WHO algorithm 3. Clinician judgment and/or patient preferences may indicate treatment for people with 10-year fracture probabilities above or below these levels FOLLOW-UP: People  with diagnosed cases of osteoporosis or at high risk for fracture should have regular bone mineral density tests. For patients eligible for Medicare, routine testing is allowed once every 2 years. The testing frequency can be increased to one year for patients who have rapidly progressing disease, those who are receiving or discontinuing medical therapy to restore bone mass, or have additional risk factors. I have reviewed this report, and agree with the above findings. [facility] Electronically Signed   By: Ammie Ferrier M.D.   On: 05/04/2022 10:37     Assessment and plan- Patient is a  72 y.o. 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  She is here for routine follow-up visit  Patient appears to be doing well. I am glad to hear that her diarrhea has resolved. In terms of her breast cancer surveillance, no concerning signs and symptoms of recurrence based on today's exam.  She is tolerating Arimidex well without any significant side effects and will continue to take until next year as planned by Dr. Janese Banks.  Her most recent mammogram which was in April 2023 was BI- RADS category 1. In terms of her osteopenia,  Patient gets her labs done every 18 months and will be due for her next dose of reclast in July 2024. We did discuss her worsening T scores which is likely secondary to decreased weight bearing exercise. Our plan will be for her PCP to recheck her vitamin D level at her upcoming visit in 2 weeks and if not in target range of above 50 (and below 100) I would recommend prescription strength supplementation. For now she will increase her vitamin D intake from 1,000 IU daily to 2,000 IU daily. She will also restart her weight bearing exercise and light weights. We will see her back in 6 months without labs.     Visit  Diagnosis 1. Malignant neoplasm of upper-outer quadrant of left breast in female, estrogen receptor positive ([place])   2. Encounter for screening mammogram for malignant neoplasm of breast   3. Visit for monitoring Arimidex therapy   4. Osteopenia, unspecified location      Minna Antis [facility] at [facility] 05/16/2022 9:43 AM

## 2022-05-16 ENCOUNTER — Encounter: Payer: Self-pay | Admitting: Oncology

## 2022-08-04 ENCOUNTER — Other Ambulatory Visit: Payer: Self-pay | Admitting: Oncology

## 2022-08-06 ENCOUNTER — Encounter: Payer: Self-pay | Admitting: Oncology

## 2022-08-08 ENCOUNTER — Ambulatory Visit
Admission: RE | Admit: 2022-08-08 | Discharge: 2022-08-08 | Disposition: A | Discharge status: Home / Self Care | Payer: Medicare Other | Source: Ambulatory Visit | Attending: Oncology | Admitting: Oncology

## 2022-08-08 DIAGNOSIS — Z170 Estrogen receptor positive status [ER+]: Secondary | ICD-10-CM | POA: Diagnosis present

## 2022-08-08 DIAGNOSIS — C50412 Malignant neoplasm of upper-outer quadrant of left female breast: Secondary | ICD-10-CM | POA: Insufficient documentation

## 2022-08-08 DIAGNOSIS — Z1231 Encounter for screening mammogram for malignant neoplasm of breast: Secondary | ICD-10-CM | POA: Insufficient documentation

## 2022-11-06 ENCOUNTER — Other Ambulatory Visit: Payer: Self-pay | Admitting: *Deleted

## 2022-11-06 DIAGNOSIS — C50412 Malignant neoplasm of upper-outer quadrant of left female breast: Secondary | ICD-10-CM

## 2022-11-07 ENCOUNTER — Inpatient Hospital Stay: Payer: Medicare Other | Attending: Oncology | Admitting: Oncology

## 2022-11-07 ENCOUNTER — Encounter: Payer: Self-pay | Admitting: Oncology

## 2022-11-07 ENCOUNTER — Inpatient Hospital Stay: Payer: Medicare Other

## 2022-11-07 ENCOUNTER — Ambulatory Visit: Payer: Medicare Other

## 2022-11-07 ENCOUNTER — Other Ambulatory Visit: Payer: Medicare Other

## 2022-11-07 VITALS — BP 148/83 | HR 64 | Temp 96.2°F | Ht 67.5 in

## 2022-11-07 DIAGNOSIS — Z79811 Long term (current) use of aromatase inhibitors: Secondary | ICD-10-CM | POA: Diagnosis not present

## 2022-11-07 DIAGNOSIS — M8580 Other specified disorders of bone density and structure, unspecified site: Secondary | ICD-10-CM

## 2022-11-07 DIAGNOSIS — C50412 Malignant neoplasm of upper-outer quadrant of left female breast: Secondary | ICD-10-CM | POA: Diagnosis present

## 2022-11-07 DIAGNOSIS — Z801 Family history of malignant neoplasm of trachea, bronchus and lung: Secondary | ICD-10-CM

## 2022-11-07 DIAGNOSIS — Z170 Estrogen receptor positive status [ER+]: Secondary | ICD-10-CM | POA: Diagnosis not present

## 2022-11-07 DIAGNOSIS — Z5181 Encounter for therapeutic drug level monitoring: Secondary | ICD-10-CM

## 2022-11-07 DIAGNOSIS — Z1231 Encounter for screening mammogram for malignant neoplasm of breast: Secondary | ICD-10-CM

## 2022-11-07 DIAGNOSIS — Z7983 Long term (current) use of bisphosphonates: Secondary | ICD-10-CM | POA: Diagnosis not present

## 2022-11-07 DIAGNOSIS — Z803 Family history of malignant neoplasm of breast: Secondary | ICD-10-CM

## 2022-11-07 DIAGNOSIS — Z8051 Family history of malignant neoplasm of kidney: Secondary | ICD-10-CM

## 2022-11-07 LAB — BASIC METABOLIC PANEL - CANCER CENTER ONLY
Anion gap: 9 (ref 5–15)
BUN: 22 mg/dL (ref 8–23)
CO2: 28 mmol/L (ref 22–32)
Calcium: 9.9 mg/dL (ref 8.9–10.3)
Chloride: 102 mmol/L (ref 98–111)
Creatinine: 0.62 mg/dL (ref 0.44–1.00)
GFR, Estimated: >60 mL/min (ref >60)
Glucose, Bld: 108 mg/dL — ABNORMAL HIGH (ref 70–99)
Potassium: 3.8 mmol/L (ref 3.5–5.1)
Sodium: 139 mmol/L (ref 135–145)

## 2022-11-07 MED ORDER — SODIUM CHLORIDE 0.9 % IV SOLN
INTRAVENOUS | Status: DC
  Filled 2022-11-07: qty 250

## 2022-11-07 MED ORDER — ZOLEDRONIC ACID 5 MG/100ML IV SOLN
5.0000 mg | INTRAVENOUS | Status: DC
  Administered 2022-11-07: 5 mg via INTRAVENOUS
  Filled 2022-11-07: qty 100

## 2022-11-07 NOTE — Patient Instructions (Signed)

## 2022-11-07 NOTE — Progress Notes (Signed)
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  6 mm tumor with negative lymph nodes that was ER/PR positive and negative.  Patient did not adjuvant chemotherapy and went on to receive adjuvant radiation treatment and started taking Arimidex in march 2019. She gets reclast for osteopenia Q18 months    Interval history-patient is tolerating Arimidex well without any significant side effects.  Denies any breast concerns.  Denies any new aches and pains anywhere  ECOG PS- 1 Pain scale- 0   Review of systems- Review of Systems  Constitutional:  Negative for chills, fever, malaise/fatigue and weight loss.  HENT:  Negative for congestion, ear discharge and nosebleeds.   Eyes:  Negative for blurred vision.  Respiratory:  Negative for cough, hemoptysis, sputum production, shortness of breath and wheezing.   Cardiovascular:  Negative for chest pain, palpitations, orthopnea and claudication.  Gastrointestinal:  Negative for abdominal pain, blood in stool, constipation, diarrhea, heartburn, melena, nausea and vomiting.  Genitourinary:  Negative for dysuria, flank pain, frequency, hematuria and urgency.  Musculoskeletal:  Negative for back pain, joint pain and myalgias.  Skin:  Negative for rash.   Neurological:  Negative for dizziness, tingling, focal weakness, seizures, weakness and headaches.  Endo/Heme/Allergies:  Does not bruise/bleed easily.  Psychiatric/Behavioral:  Negative for depression and suicidal ideas. The patient does not have insomnia.       Allergies  Allergen Reactions   Amoxicillin Hives, Itching and Other (See Comments)    Has patient had a PCN reaction causing immediate rash, facial/tongue/throat swelling, SOB or lightheadedness with hypotension: No Has patient had a PCN reaction causing severe rash involving mucus membranes or skin necrosis: No Has patient had a PCN reaction that required hospitalization: No Has patient had a PCN reaction occurring within the last 10 years: No If all of the above answers are "NO", then may proceed with Cephalosporin use.    Erythromycin Hives and Itching   Sulfa Antibiotics Hives and Itching   Contrast Media [Iodinated Contrast Media] Rash    Red rash and itching. Topical iodine not a problem.   Morphine Nausea And Vomiting and Other (See Comments)    Pretty severe vomiting. Can take hydrocodone and oxycodone       Past Medical History:  Diagnosis Date   Anemia    Ankle sprain 03/2017   Anxiety    Arthritis    Breast cancer (HCC) 2018   Breast cancer (HCC) 03/2017   Depression    Diabetes mellitus without complication (HCC)    Fracture of distal end of radius 03/2017   left   Genetic testing 05/04/2017   Multi-Cancer panel (83 genes) @ [facility] - No pathogenic mutations detected   GERD (gastroesophageal reflux disease)    Headache    History of hiatal hernia    Hyperlipidemia    Hypothyroidism  Personal history of radiation therapy    Pneumonia    Pre-diabetes 03/2017   follows healthy diabetic diet   Sleep apnea    has not used cpap in a long time   Vitamin B 12 deficiency      Past Surgical History:  Procedure Laterality Date   ABDOMINAL HYSTERECTOMY  1983   APPENDECTOMY  1977   when she had  c-section   BREAST BIOPSY Left 03/27/2017   INVASIVE MAMMARY CARCINOMA Grade 1 UIG   BREAST LUMPECTOMY Left 04/13/2017   INVASIVE MAMMARY CARCINOMA/ DCIS, CLEAR MARGINS, NEG. LN   CESAREAN SECTION     COLONOSCOPY     COLONOSCOPY WITH PROPOFOL N/A 02/25/2020   Procedure: COLONOSCOPY WITH PROPOFOL;  Surgeon: Toney Reil, MD;  Location: [facility] ENDOSCOPY;  Service: Gastroenterology;  Laterality: N/A;   DILATION AND CURETTAGE OF UTERUS     ESOPHAGOGASTRODUODENOSCOPY (EGD) WITH PROPOFOL N/A 02/05/2015   Procedure: ESOPHAGOGASTRODUODENOSCOPY (EGD) WITH PROPOFOL;  Surgeon: Elnita Maxwell, MD;  Location: [facility] ENDOSCOPY;  Service: Endoscopy;  Laterality: N/A;   ESOPHAGOGASTRODUODENOSCOPY (EGD) WITH PROPOFOL N/A 02/25/2020   Procedure: ESOPHAGOGASTRODUODENOSCOPY (EGD) WITH PROPOFOL;  Surgeon: Toney Reil, MD;  Location: [facility] ENDOSCOPY;  Service: Gastroenterology;  Laterality: N/A;   PARTIAL MASTECTOMY WITH NEEDLE LOCALIZATION Left 04/13/2017   Procedure: PARTIAL MASTECTOMY WITH NEEDLE LOCALIZATION;  Surgeon: Nadeen Landau, MD;  Location: [facility] ORS;  Service: General;  Laterality: Left;   PATELLA FRACTURE SURGERY Left 1991   screws in place   SAVORY DILATION N/A 02/05/2015   Procedure: SAVORY DILATION;  Surgeon: Elnita Maxwell, MD;  Location: [facility] ENDOSCOPY;  Service: Endoscopy;  Laterality: N/A;   SENTINEL NODE BIOPSY Left 04/13/2017   Procedure: SENTINEL NODE BIOPSY;  Surgeon: Nadeen Landau, MD;  Location: [facility] ORS;  Service: General;  Laterality: Left;   SINUS SURGERY WITH INSTATRAK     x 2   XI ROBOTIC ASSISTED PARAESOPHAGEAL HERNIA REPAIR N/A 08/18/2021   Procedure: XI ROBOTIC ASSISTED PARAESOPHAGEAL HERNIA REPAIR with RNFA to assist;  Surgeon: Leafy Ro, MD;  Location: [facility] ORS;  Service: General;  Laterality: N/A;    Social History   Socioeconomic History   Marital status: Married    Spouse name: John   Number of children: Not on file   Years of  education: Not on file   Highest education level: Not on file  Occupational History   Not on file  Tobacco Use   Smoking status: Never    Passive exposure: Past   Smokeless tobacco: Never  Vaping Use   Vaping Use: Never used  Substance and Sexual Activity   Alcohol use: No   Drug use: No   Sexual activity: Not Currently  Other Topics Concern   Not on file  Social History Narrative   2 grandchildren lives with them, they have full custody   Social Determinants of Health   Financial Resource Strain: Not on file  Food Insecurity: Not on file  Transportation Needs: Not on file  Physical Activity: Not on file  Stress: Not on file  Social Connections: Not on file  Intimate Partner Violence: Not on file    Family History  Problem Relation Age of Onset   Breast cancer Maternal Aunt 70       deceased 84s   Breast cancer Cousin 39       daughter of mat aunt with breast cancer   Breast cancer Cousin 84       daughter of mat  aunt with breast cancer   Deep vein thrombosis Mother    Heart disease Mother    Stroke Mother    Heart disease Father    Parkinson's disease Father    Heart disease Sister    Stroke Sister    Skin cancer Brother    Heart disease Brother    Alzheimer's disease Brother    Lung cancer Paternal Uncle        deceased 32s   Heart disease Brother    Alzheimer's disease Brother    Heart disease Brother    Alzheimer's disease Brother    Cancer Brother        unclear primary; currently 54s   Cirrhosis Sister    Alzheimer's disease Sister    Kidney cancer Son 37       nephrectomy @ [facility]     Current Outpatient Medications:    acetaminophen (TYLENOL) 500 MG tablet, Take 1,000 mg by mouth every 6 (six) hours as needed for moderate pain or headache., Disp: , Rfl:    amphetamine-dextroamphetamine (ADDERALL XR) 10 MG 24 hr capsule, Take 10 mg by mouth daily., Disp: , Rfl:    amphetamine-dextroamphetamine (ADDERALL) 20 MG tablet, Take 20 mg by mouth every  morning., Disp: , Rfl:    anastrozole (ARIMIDEX) 1 MG tablet, TAKE 1 TABLET BY MOUTH EVERY DAY, Disp: 90 tablet, Rfl: 0   ARIPiprazole (ABILIFY) 10 MG tablet, Take by mouth., Disp: , Rfl:    ARIPiprazole (ABILIFY) 15 MG tablet, Take 15 mg by mouth daily., Disp: , Rfl:    atorvastatin (LIPITOR) 40 MG tablet, Take 40 mg by mouth daily. , Disp: , Rfl:    BANOPHEN 25 MG capsule, SMARTSIG:2 pill By Mouth, Disp: , Rfl:    calcium carbonate (OSCAL) 1500 (600 Ca) MG TABS tablet, Take by mouth 2 (two) times daily with a meal., Disp: , Rfl:    darifenacin (ENABLEX) 15 MG 24 hr tablet, TK 1 T PO ONCE D FOR OVERACTIVE BLADDER., Disp: , Rfl:    fluticasone (FLONASE) 50 MCG/ACT nasal spray, Place 2 sprays into both nostrils daily as needed for allergies., Disp: , Rfl:    levocetirizine (XYZAL) 5 MG tablet, Take 5 mg by mouth every evening. , Disp: , Rfl:    levothyroxine (SYNTHROID) 150 MCG tablet, Take by mouth., Disp: , Rfl:    lipase/protease/amylase (CREON) 36000 UNITS CPEP capsule, Take 2 capsules with the first bite of each meal and 1 capsule with the first bite of each snack, Disp: 720 capsule, Rfl: 3   LORazepam (ATIVAN) 0.5 MG tablet, Take 0.5 mg by mouth 3 (three) times daily., Disp: , Rfl:    metFORMIN (GLUCOPHAGE-XR) 500 MG 24 hr tablet, Take 1,000 mg by mouth daily with breakfast., Disp: , Rfl:    montelukast (SINGULAIR) 10 MG tablet, , Disp: , Rfl:    mupirocin ointment (BACTROBAN) 2 %, Apply 1 application topically 3 (three) times daily as needed., Disp: , Rfl:    ondansetron (ZOFRAN-ODT) 4 MG disintegrating tablet, Take 1 tablet (4 mg total) by mouth every 6 (six) hours as needed for nausea., Disp: 20 tablet, Rfl: 0   oxybutynin (DITROPAN-XL) 5 MG 24 hr tablet, Take 1 tablet by mouth daily., Disp: , Rfl:    traZODone (DESYREL) 150 MG tablet, Take 150 mg by mouth at bedtime., Disp: , Rfl:    vitamin B-12 (CYANOCOBALAMIN) 1000 MCG tablet, Take 1,000 mcg by mouth daily., Disp: , Rfl:     vortioxetine HBr (TRINTELLIX) 20  MG TABS tablet, Take 20 mg by mouth daily., Disp: , Rfl:  No current facility-administered medications for this visit.  Facility-Administered Medications Ordered in Other Visits:    0.9 %  sodium chloride infusion, , Intravenous, Continuous, Creig Hines, MD, Stopped at 11/07/22 1159   zoledronic acid (RECLAST) injection 5 mg, 5 mg, Intravenous, UD, Creig Hines, MD, Stopped at 11/07/22 1158  Physical exam:  Vitals:   11/07/22 0942  BP: (!) 148/83  Pulse: 64  Temp: (!) 96.2 F (35.7 C)  TempSrc: Tympanic  SpO2: 97%  Height: 5' 7.5" (1.715 m)   Physical Exam Cardiovascular:     Rate and Rhythm: Normal rate and regular rhythm.     Heart sounds: Normal heart sounds.  Pulmonary:     Effort: Pulmonary effort is normal.     Breath sounds: Normal breath sounds.  Abdominal:     General: Bowel sounds are normal.     Palpations: Abdomen is soft.  Skin:    General: Skin is warm and dry.  Neurological:     Mental Status: She is alert and oriented to person, place, and time.    Breast exam was performed in seated and lying down position. Patient is status post left lumpectomy with a well-healed surgical scar. No evidence of any palpable masses. No evidence of axillary adenopathy. No evidence of any palpable masses or lumps in the right breast. No evidence of right axillary adenopathy      Latest Ref Rng & Units 11/07/2022    9:47 AM  CMP  Glucose 70 - 99 mg/dL 161   BUN 8 - 23 mg/dL 22   Creatinine 0.96 - 1.00 mg/dL 0.45   Sodium 409 - 811 mmol/L 139   Potassium 3.5 - 5.1 mmol/L 3.8   Chloride 98 - 111 mmol/L 102   CO2 22 - 32 mmol/L 28   Calcium 8.9 - 10.3 mg/dL 9.9       Latest Ref Rng & Units 08/19/2021    6:09 AM  CBC  WBC 4.0 - 10.5 K/uL 13.0   Hemoglobin 12.0 - 15.0 g/dL 91.4   Hematocrit 78.2 - 46.0 % 37.5   Platelets 150 - 400 K/uL 228     Assessment and plan- Patient is a 72 y.o. female  with pathological prognostic stage Ia  invasive mammary carcinoma of the right breast p T1b p N0 c M0 ER PR positive HER-2/neu negative status post lumpectomy and adjuvant RT and currently on arimidex.  This is a routine for follow-up visit for breast cancer  Patient has completed 5 years of endocrine therapy and given that she had a small 6 mm stage I ER/PR positive HER2 negative breast cancer she does not require to take endocrine therapy any longer.  Have asked her to stop her Arimidex.  Patient has history of osteopenia and has been on Reclast every 18 months.  She will be receiving her next dose today.  Her bone density scan that was done in the last few months shows stable T-scores.  I plan to give her a break from bisphosphonate therapy at this time  I will see her back in 1 year no labs   Visit Diagnosis 1. Malignant neoplasm of upper-outer quadrant of left breast in female, estrogen receptor positive (HCC)   2. Encounter for screening mammogram for malignant neoplasm of breast   3. Encounter for monitoring bisphosphonate therapy   4. Visit for monitoring Arimidex therapy      Dr.  Owens Shark, MD, MPH [facility] [phone] 11/07/2022 1:27 PM

## 2022-11-13 ENCOUNTER — Ambulatory Visit: Payer: Medicare Other | Admitting: Oncology

## 2022-12-03 ENCOUNTER — Encounter: Payer: Self-pay | Admitting: Oncology

## 2022-12-04 ENCOUNTER — Ambulatory Visit: Payer: Medicare Other | Admitting: Internal Medicine

## 2022-12-04 NOTE — Progress Notes (Signed)
Sleep Medicine   Office Visit  Patient Name: April Leblanc DOB: 10/15/1971 MRN [id number]    Chief Complaint: ***  Brief History:  April Leblanc presents for initial sleep consult with a *** history of ***. Sleep quality is ***. This is noted *** nights. The patient's bed partner reports  *** at night. The patient relates the following symptoms: *** are also present. The patient goes to sleep at *** and wakes up at ***. Sleep quality is *** when outside home environment.  Patient has noted *** of his legs at night.  The patient  relates *** behavior during the night.  The patient *** a history of psychiatric problems. The Epworth Sleepiness Score is *** out of 24 .  The patient relates  Cardiovascular risk factors include: *** The patient reports ***    ROS  General: (-) fever, (-) chills, (-) night sweat Nose and Sinuses: (-) nasal stuffiness or itchiness, (-) postnasal drip, (-) nosebleeds, (-) sinus trouble. Mouth and Throat: (-) sore throat, (-) hoarseness. Neck: (-) swollen glands, (-) enlarged thyroid, (-) neck pain. Respiratory: *** cough, *** shortness of breath, *** wheezing. Neurologic: *** numbness, *** tingling. Psychiatric: *** anxiety, *** depression Sleep behavior: ***sleep paralysis ***hypnogogic hallucinations ***dream enactment      ***vivid dreams ***cataplexy ***night terrors ***sleep walking   Current Medication: No outpatient encounter medications on file as of 06/08/2022.   No facility-administered encounter medications on file as of 06/08/2022.    Surgical History: *** The histories are not reviewed yet. Please review them in the "History" navigator section and refresh this [place].  Medical History: No past medical history on file.  Family History: Non contributory to the present illness  Social History: Social History   Socioeconomic History   Marital status: Not on file    Spouse name: Not on file   Number of children: Not on file   Years of  education: Not on file   Highest education level: Not on file  Occupational History   Not on file  Tobacco Use   Smoking status: Not on file   Smokeless tobacco: Not on file  Substance and Sexual Activity   Alcohol use: Not on file   Drug use: Not on file   Sexual activity: Not on file  Other Topics Concern   Not on file  Social History Narrative   Not on file   Social Determinants of Health   Financial Resource Strain: Not on file  Food Insecurity: Not on file  Transportation Needs: Not on file  Physical Activity: Not on file  Stress: Not on file  Social Connections: Not on file  Intimate Partner Violence: Not on file    Vital Signs: There were no vitals taken for this visit. There is no height or weight on file to calculate BMI.   Examination: General Appearance: The patient is well-developed, well-nourished, and in no distress. Neck Circumference: *** Skin: Gross inspection of skin unremarkable. Head: normocephalic, no gross deformities. Eyes: no gross deformities noted. ENT: ears appear grossly normal Neurologic: Alert and oriented. No involuntary movements.    STOP BANG RISK ASSESSMENT S (snore) Have you been told that you snore?     YES/N   T (tired) Are you often tired, fatigued, or sleepy during the day?   YES/NO  O (obstruction) Do you stop breathing, choke, or gasp during sleep? YES/NO   P (pressure) Do you have or are you being treated for high blood pressure? YES/NO   B (BMI) Is  your body index greater than 35 kg/m? YES/NO   A (age) Are you 29 years old or older? YES/NO   N (neck) Do you have a neck circumference greater than 16 inches?   YES/NO   G (gender) Are you a female? YES/NO   TOTAL STOP/BANG "YES" ANSWERS                                                                A STOP-Bang score of 2 or less is considered low risk, and a score of 5 or more is high risk for having either moderate or severe OSA. For people who score 3 or 4,  doctors may need to perform further assessment to determine how likely they are to have OSA.         EPWORTH SLEEPINESS SCALE:  Scale:  (0)= no chance of dozing; (1)= slight chance of dozing; (2)= moderate chance of dozing; (3)= high chance of dozing  Chance  Situtation    Sitting and reading: ***    Watching TV: ***    Sitting Inactive in public: ***    As a passenger in car: ***      Lying down to rest: ***    Sitting and talking: ***    Sitting quielty after lunch: ***    In a car, stopped in traffic: ***   TOTAL SCORE:   *** out of 24    SLEEP STUDIES:  ***   LABS: No results found for this or any previous visit (from the past 2160 hour(s)).  Radiology: Patient was never admitted.  No results found.  No results found.    Assessment and Plan: There are no problems to display for this patient.    PLAN OSA:   Patient evaluation suggests high risk of sleep disordered breathing due to *** Patient has comorbid cardiovascular risk factors including: *** which could be exacerbated by pathologic sleep-disordered breathing.  Suggest: *** to assess/treat the patient's sleep disordered breathing. The patient was also counselled on *** to optimize sleep health.  PLAN hypersomnia:  Patient evaluation suggests significant daytime hypersomnia.  The Epworth Sleepiness Score is elevated at *** out of 24. Patient *** drowsy driving. The patient *** MVA due to sleepiness.  The patient *** restless leg symptoms which exacerbate *** for *** nights per week. The patient *** periodic limb movements which exacerbate ***  for *** nights per week. Suggest: ***  Also suggest ***  PLAN insomnia:  Patient evaluation suggests *** insomnia. This is a chronic disorder. This has been a concern for *** and causes impaired daytime functioning. The patient exhibits comorbid ***  The history *** suggest the insomnia predates the use of hypnotic medications. The symptoms *** with the  discontinuation of these medications. There is no obvious medical, psychiatric or pharmacologic abuse issues ot account for the insomnia.  Treatment recommendations include: *** The patient should maintain a sleep log and calculate total sleep time for 1-2 weeks. Set bed and wake times for achieve 85% sleep efficiency for one week. Once this is achieved  time in bed can be gradually increased. A pharmacologic treatment approach would include a trial of *** for the next ***  months. During this time the patient is to maintain a sleep diary to  track progress.    ***  General Counseling: I have discussed the findings of the evaluation and examination with Erasmo Downer.  I have also discussed any further diagnostic evaluation thatmay be needed or ordered today. Whitman verbalizes understanding of the findings of todays visit. We also reviewed his medications today and discussed drug interactions and side effects including but not limited excessive drowsiness and altered mental states. We also discussed that there is always a risk not just to him but also people around him. he has been encouraged to call the office with any questions or concerns that should arise related to todays visit.  No orders of the defined types were placed in this encounter.       I have personally obtained a history, evaluated the patient, evaluated pertinent data, formulated the assessment and plan and placed orders.    Allyne Gee, MD [facility] Diplomate ABMS Pulmonary and Critical Care Medicine Sleep medicine

## 2022-12-13 DIAGNOSIS — Z853 Personal history of malignant neoplasm of breast: Secondary | ICD-10-CM

## 2022-12-13 DIAGNOSIS — R4189 Other symptoms and signs involving cognitive functions and awareness: Secondary | ICD-10-CM

## 2022-12-13 HISTORY — DX: Personal history of malignant neoplasm of breast: Z85.3

## 2022-12-13 HISTORY — DX: Other symptoms and signs involving cognitive functions and awareness: R41.89

## 2023-03-05 ENCOUNTER — Telehealth: Payer: Self-pay

## 2023-03-05 NOTE — Telephone Encounter (Signed)
Patient called April Leblanc back and she stated that she is at home now waiting on her call.

## 2023-03-05 NOTE — Telephone Encounter (Signed)
Patient is [employer] patient assistance form is expiring 05/01/2023. Patient is also due for a follow up appointment with Inetta Fermo or Dr. Allegra Lai. Called patient and she states she has been out of medication for several months because she called our office for a refill to send to the patient assistance company and never heard anything back. Informed her I was sorry I never got any message about this. Asked her if we could scheduled her a appointment and she said she would need to give [place] a call back because she is at a appointment with her grandson and will call us when she gets home

## 2023-03-06 NOTE — Telephone Encounter (Signed)
Patient made appointment with Inetta Fermo on 04/09/2023 at 2:30pm. She will sign the application forms at the appointment so we can reapply for 2025.

## 2023-04-09 ENCOUNTER — Encounter: Payer: Self-pay | Admitting: Physician Assistant

## 2023-04-09 ENCOUNTER — Ambulatory Visit (INDEPENDENT_AMBULATORY_CARE_PROVIDER_SITE_OTHER): Payer: Medicare Other | Admitting: Physician Assistant

## 2023-04-09 ENCOUNTER — Other Ambulatory Visit: Payer: Self-pay

## 2023-04-09 VITALS — BP 155/92 | HR 88 | Temp 97.7°F | Ht 67.5 in | Wt 154.4 lb

## 2023-04-09 DIAGNOSIS — K529 Noninfective gastroenteritis and colitis, unspecified: Secondary | ICD-10-CM | POA: Diagnosis not present

## 2023-04-09 DIAGNOSIS — K760 Fatty (change of) liver, not elsewhere classified: Secondary | ICD-10-CM

## 2023-04-09 DIAGNOSIS — R7989 Other specified abnormal findings of blood chemistry: Secondary | ICD-10-CM

## 2023-04-09 DIAGNOSIS — K8681 Exocrine pancreatic insufficiency: Secondary | ICD-10-CM

## 2023-04-09 DIAGNOSIS — K8689 Other specified diseases of pancreas: Secondary | ICD-10-CM

## 2023-04-09 DIAGNOSIS — Z860100 Personal history of colon polyps, unspecified: Secondary | ICD-10-CM | POA: Diagnosis not present

## 2023-04-09 MED ORDER — PANCRELIPASE (LIP-PROT-AMYL) 36000-114000 UNITS PO CPEP: ORAL_CAPSULE | ORAL | 3 refills | Status: AC

## 2023-04-09 NOTE — Progress Notes (Signed)
Celso Amy, PA-C [address]  [address]  [place], [place] [address]  Main: [phone]  Fax: [phone]   Primary Care Physician: Lynwood Dawley, MD  Primary Gastroenterologist:  Celso Amy, PA-C / Dr. Lannette Donath    CC: F/U Pancreatic Insufficiency  HPI: April Leblanc is a 72 y.o. female returns for annual followup of Chronic Diarrhea and EPI.  Pancreatic fecal elastase levels were less than 100.  Currently takes Creon 36,000 Lipase units, 2 with each meal and 1 with each snack.  Diarrhea has improved on this treatment, however she continues to have occasional episode of diarrhea.  She takes OTC Imodium as needed.  She needs refill of Creon through medication assistance program.  Currently having 1 or 2 formed stools daily.  Hx GERD, diabetes, fatty liver, history of medium size hiatal hernia s/p robotic assisted laparoscopic Nissen fundoplication with repair of paraesophageal hernia on 08/18/2021.  No longer on PPI.  Hx adenomatous Colon polyps.  01/2020: 2 dimunitive tubular adenoma polyps removed.  7 year Repeat colonoscopy will be due 01/2027.  05/2020: Liver Biopsy: Hepatic Steatosis.  She has intentionally lost weight this year eating healthier, smaller meals.  Current Outpatient Medications  Medication Sig Dispense Refill   acetaminophen (TYLENOL) 500 MG tablet Take 1,000 mg by mouth every 6 (six) hours as needed for moderate pain or headache.     amphetamine-dextroamphetamine (ADDERALL XR) 10 MG 24 hr capsule Take 10 mg by mouth daily.     amphetamine-dextroamphetamine (ADDERALL) 20 MG tablet Take 20 mg by mouth every morning.     anastrozole (ARIMIDEX) 1 MG tablet TAKE 1 TABLET BY MOUTH EVERY DAY 90 tablet 0   ARIPiprazole (ABILIFY) 10 MG tablet Take by mouth.     ARIPiprazole (ABILIFY) 15 MG tablet Take 15 mg by mouth daily.     atorvastatin (LIPITOR) 40 MG tablet Take 40 mg by mouth daily.      BANOPHEN 25 MG capsule SMARTSIG:2 pill By Mouth      calcium carbonate (OSCAL) 1500 (600 Ca) MG TABS tablet Take by mouth 2 (two) times daily with a meal.     darifenacin (ENABLEX) 15 MG 24 hr tablet TK 1 T PO ONCE D FOR OVERACTIVE BLADDER.     fluticasone (FLONASE) 50 MCG/ACT nasal spray Place 2 sprays into both nostrils daily as needed for allergies.     levocetirizine (XYZAL) 5 MG tablet Take 5 mg by mouth every evening.      levothyroxine (SYNTHROID) 150 MCG tablet Take by mouth.     LORazepam (ATIVAN) 0.5 MG tablet Take 0.5 mg by mouth 3 (three) times daily.     metFORMIN (GLUCOPHAGE-XR) 500 MG 24 hr tablet Take 1,000 mg by mouth daily with breakfast.     montelukast (SINGULAIR) 10 MG tablet      mupirocin ointment (BACTROBAN) 2 % Apply 1 application topically 3 (three) times daily as needed.     ondansetron (ZOFRAN-ODT) 4 MG disintegrating tablet Take 1 tablet (4 mg total) by mouth every 6 (six) hours as needed for nausea. 20 tablet 0   oxybutynin (DITROPAN-XL) 5 MG 24 hr tablet Take 1 tablet by mouth daily.     traZODone (DESYREL) 150 MG tablet Take 150 mg by mouth at bedtime.     vitamin B-12 (CYANOCOBALAMIN) 1000 MCG tablet Take 1,000 mcg by mouth daily.     vortioxetine HBr (TRINTELLIX) 20 MG TABS tablet Take 20 mg by mouth daily.  lipase/protease/amylase (CREON) 36000 UNITS CPEP capsule Take 2 capsules with the first bite of each meal and 1 capsule with the first bite of each snack 720 capsule 3   No current facility-administered medications for this visit.    Allergies as of 04/09/2023 - Review Complete 04/09/2023  Allergen Reaction Noted   Amoxicillin Hives, Itching, and Other (See Comments) 12/19/2011   Erythromycin Hives and Itching 12/19/2011   Sulfa antibiotics Hives and Itching 12/19/2011   Contrast media [iodinated contrast media] Rash 02/04/2015   Morphine Nausea And Vomiting and Other (See Comments) 12/19/2011    Past Medical History:  Diagnosis Date   Anemia    Ankle sprain 03/2017   Anxiety    Arthritis     Breast cancer (HCC) 2018   Breast cancer (HCC) 03/2017   Cognitive impairment 12/13/2022   Depression    Diabetes mellitus without complication (HCC)    Fracture of distal end of radius 03/2017   left   Genetic testing 05/04/2017   Multi-Cancer panel (83 genes) @ [facility] - No pathogenic mutations detected   GERD (gastroesophageal reflux disease)    Headache    History of hiatal hernia    History of left breast cancer 12/13/2022   Hyperlipidemia    Hypothyroidism    Personal history of radiation therapy    Pneumonia    Pre-diabetes 03/2017   follows healthy diabetic diet   Sleep apnea    has not used cpap in a long time   Vitamin B 12 deficiency     Past Surgical History:  Procedure Laterality Date   ABDOMINAL HYSTERECTOMY  1983   APPENDECTOMY  1977   when she had c-section   BREAST BIOPSY Left 03/27/2017   INVASIVE MAMMARY CARCINOMA Grade 1 UIG   BREAST LUMPECTOMY Left 04/13/2017   INVASIVE MAMMARY CARCINOMA/ DCIS, CLEAR MARGINS, NEG. LN   CESAREAN SECTION     COLONOSCOPY     COLONOSCOPY WITH PROPOFOL N/A 02/25/2020   Procedure: COLONOSCOPY WITH PROPOFOL;  Surgeon: Toney Reil, MD;  Location: [facility] ENDOSCOPY;  Service: Gastroenterology;  Laterality: N/A;   DILATION AND CURETTAGE OF UTERUS     ESOPHAGOGASTRODUODENOSCOPY (EGD) WITH PROPOFOL N/A 02/05/2015   Procedure: ESOPHAGOGASTRODUODENOSCOPY (EGD) WITH PROPOFOL;  Surgeon: Elnita Maxwell, MD;  Location: [facility] ENDOSCOPY;  Service: Endoscopy;  Laterality: N/A;   ESOPHAGOGASTRODUODENOSCOPY (EGD) WITH PROPOFOL N/A 02/25/2020   Procedure: ESOPHAGOGASTRODUODENOSCOPY (EGD) WITH PROPOFOL;  Surgeon: Toney Reil, MD;  Location: [facility] ENDOSCOPY;  Service: Gastroenterology;  Laterality: N/A;   PARTIAL MASTECTOMY WITH NEEDLE LOCALIZATION Left 04/13/2017   Procedure: PARTIAL MASTECTOMY WITH NEEDLE LOCALIZATION;  Surgeon: Nadeen Landau, MD;  Location: [facility] ORS;  Service: General;  Laterality: Left;   PATELLA  FRACTURE SURGERY Left 1991   screws in place   SAVORY DILATION N/A 02/05/2015   Procedure: SAVORY DILATION;  Surgeon: Elnita Maxwell, MD;  Location: [facility] ENDOSCOPY;  Service: Endoscopy;  Laterality: N/A;   SENTINEL NODE BIOPSY Left 04/13/2017   Procedure: SENTINEL NODE BIOPSY;  Surgeon: Nadeen Landau, MD;  Location: [facility] ORS;  Service: General;  Laterality: Left;   SINUS SURGERY WITH INSTATRAK     x 2   XI ROBOTIC ASSISTED PARAESOPHAGEAL HERNIA REPAIR N/A 08/18/2021   Procedure: XI ROBOTIC ASSISTED PARAESOPHAGEAL HERNIA REPAIR with RNFA to assist;  Surgeon: Leafy Ro, MD;  Location: [facility] ORS;  Service: General;  Laterality: N/A;    Review of Systems:    All systems reviewed and negative except where noted  in HPI.   Physical Examination:   BP (!) 155/92   Pulse 88   Temp 97.7 F (36.5 C)   Ht 5' 7.5" (1.715 m)   Wt 154 lb 6.4 oz (70 kg)   BMI 23.83 kg/m   General: Well-nourished, well-developed in no acute distress. Walking with a cane. Neuro: Alert and oriented x 3.  Grossly intact.  Psych: Alert and cooperative, normal mood and affect.   Imaging Studies: No results found.  Assessment and Plan:   April Leblanc is a 72 y.o. y/o female returns for followup of:   EPI with Chronic diarrhea  Continue Creon 36000 lipase units, 2 with meals, 1 with snacks.  Continue OTC Imodium prn.  Hepatic Steatosis / Fatty Liver Lab: NASH Fibrosure  GERD  Resolved post Nissen Fundoplication.  Hx Colon Polyps 7 year repeat Colonoscopy due 01/2027  Celso Amy, PA-C  Follow up in 1 year.

## 2023-04-09 NOTE — Telephone Encounter (Signed)
Faxed application form to Creon patient assistance.

## 2023-04-12 LAB — NASH FIBROSURE(R) PLUS
ALPHA 2-MACROGLOBULINS, QN: 176 mg/dL (ref 110–276)
ALT (SGPT) P5P: 28 [IU]/L (ref 0–40)
AST (SGOT) P5P: 30 [IU]/L (ref 0–40)
Apolipoprotein A-1: 144 mg/dL (ref 116–209)
Bilirubin, Total: 0.4 mg/dL (ref 0.0–1.2)
Cholesterol, Total: 112 mg/dL (ref 100–199)
Fibrosis Score: 0.17 (ref 0.00–0.21)
GGT: 24 [IU]/L (ref 0–60)
Glucose: 114 mg/dL — ABNORMAL HIGH (ref 70–99)
Haptoglobin: 172 mg/dL (ref 42–346)
NASH Score: 0.66 — ABNORMAL HIGH (ref 0.00–0.25)
Steatosis Score: 0.56 — ABNORMAL HIGH (ref 0.00–0.40)
Triglycerides: 88 mg/dL (ref 0–149)

## 2023-04-13 NOTE — Progress Notes (Signed)
Call and notify patient lab shows: No evidence of liver fibrosis or scar tissue in the liver (F0 Fibrosis Score).  This is great news.  She has moderate steatosis or fatty liver disease.  Her liver tests are normal.  Keep up the good work with low-fat diet, exercise, and weight loss! Celso Amy, PA-C

## 2023-04-16 ENCOUNTER — Telehealth: Payer: Self-pay

## 2023-04-16 NOTE — Telephone Encounter (Signed)
Patient notified.  Call and notify patient lab shows:  No evidence of liver fibrosis or scar tissue in the liver (F0 Fibrosis Score).  This is great news.  She has moderate steatosis or fatty liver disease.  Her liver tests are normal.  Keep up the good work with low-fat diet, exercise, and weight loss!  Celso Amy, PA-C

## 2023-04-16 NOTE — Telephone Encounter (Signed)
They said the application is in the processing workque so they will process it in the next 5 to 7 business days

## 2023-04-16 NOTE — Telephone Encounter (Signed)
Left message to call patient.  Call and notify patient lab shows:  No evidence of liver fibrosis or scar tissue in the liver (F0 Fibrosis Score).  This is great news.  She has moderate steatosis or fatty liver disease.  Her liver tests are normal.  Keep up the good work with low-fat diet, exercise, and weight loss!  Celso Amy, PA-C

## 2023-04-19 NOTE — Telephone Encounter (Signed)
Received a fax from [employer] patient assistance that they are missing information from your patient. We have requested the needed information directly from the patient. Once we receive these documents, we will continue our evaluation. Called patient and unable to leave a message because voicemail is not set up.

## 2023-04-19 NOTE — Telephone Encounter (Signed)
They will working on the application now and we will have a answer by the end of the day today
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
## 2023-05-07 NOTE — Telephone Encounter (Signed)
 Was on hold 40 minutes to check the status will try again at a later time

## 2023-05-08 NOTE — Telephone Encounter (Signed)
 Was on hold 50 minutes to check on the application will try again later

## 2023-05-10 NOTE — Telephone Encounter (Signed)
 The rep said they are still waiting on the proof of income from the patient

## 2023-06-06 ENCOUNTER — Telehealth: Payer: Self-pay | Admitting: Gastroenterology

## 2023-06-06 NOTE — Telephone Encounter (Signed)
 Called Creon  patient assistance to check the status of patient application was on hold for 40 minutes and finally talk to someone. They said they have the application closed because they are waiting on patient to send them her income statement. Return patient call she needed the number of patient assistance so she could call them to give them the information

## 2023-06-06 NOTE — Telephone Encounter (Signed)
 The patient called in to get the information for creon  services.

## 2023-07-11 ENCOUNTER — Encounter: Payer: Self-pay | Admitting: Oncology

## 2023-08-03 ENCOUNTER — Encounter: Payer: Self-pay | Admitting: Oncology

## 2023-08-10 ENCOUNTER — Ambulatory Visit
Admission: RE | Admit: 2023-08-10 | Discharge: 2023-08-10 | Disposition: A | Discharge status: Home / Self Care | Payer: Medicare Other | Source: Ambulatory Visit | Attending: Oncology | Admitting: Oncology

## 2023-08-10 DIAGNOSIS — Z170 Estrogen receptor positive status [ER+]: Secondary | ICD-10-CM

## 2023-08-10 DIAGNOSIS — Z1231 Encounter for screening mammogram for malignant neoplasm of breast: Secondary | ICD-10-CM | POA: Diagnosis present

## 2023-08-10 DIAGNOSIS — Z853 Personal history of malignant neoplasm of breast: Secondary | ICD-10-CM | POA: Diagnosis not present

## 2023-09-12 ENCOUNTER — Encounter: Payer: Self-pay | Admitting: Ophthalmology

## 2023-09-12 NOTE — Anesthesia Preprocedure Evaluation (Addendum)
 Anesthesia Evaluation    Airway Mallampati: III  TM Distance: >3 FB Neck ROM: Full    Dental no notable dental hx. (+) Caps Few caps in molar region; none in front:   Pulmonary sleep apnea , pneumonia   Pulmonary exam normal breath sounds clear to auscultation       Cardiovascular Normal cardiovascular exam Rhythm:Regular Rate:Normal     Neuro/Psych  Headaches PSYCHIATRIC DISORDERS Anxiety Depression       GI/Hepatic hiatal hernia,GERD  ,,(+) Hepatitis -  Endo/Other  diabetesHypothyroidism    Renal/GU      Musculoskeletal  (+) Arthritis ,    Abdominal   Peds  Hematology  (+) Blood dyscrasia, anemia   Anesthesia Other Findings Hypothyroidism  GERD (gastroesophageal reflux disease) Headache  Depression Vitamin B 12 deficiency Hyperlipidemia Anemia  Sleep apnea Arthritis  Fracture of distal end of radius Pre-diabetes  Ankle sprain Genetic testing  Breast cancer (HCC) Breast cancer (HCC)  Personal history of radiation therapy Diabetes mellitus without complication (HCC)  Pneumonia History of hiatal hernia  Anxiety History of left breast cancer Cognitive impairment Wears dentures  Polypharmacy OSA on CPAP  Pancreatic insufficiency Chronic diarrhea  S/P repair of paraesophageal hernia Chronic fatigue     Reproductive/Obstetrics                             Anesthesia Physical Anesthesia Plan  ASA: 2  Anesthesia Plan: MAC   Post-op Pain Management:    Induction: Intravenous  PONV Risk Score and Plan:   Airway Management Planned: Natural Airway and Nasal Cannula  Additional Equipment:   Intra-op Plan:   Post-operative Plan:   Informed Consent: I have reviewed the patients History and Physical, chart, labs and discussed the procedure including the risks, benefits and alternatives for the proposed anesthesia with the patient or authorized representative who has  indicated his/her understanding and acceptance.     Dental Advisory Given  Plan Discussed with: Anesthesiologist, CRNA and Surgeon  Anesthesia Plan Comments: (Patient consented for risks of anesthesia including but not limited to:  - adverse reactions to medications - damage to eyes, teeth, lips or other oral mucosa - nerve damage due to positioning  - sore throat or hoarseness - Damage to heart, brain, nerves, lungs, other parts of body or loss of life  Patient voiced understanding and assent.)       Anesthesia Quick Evaluation

## 2023-09-13 NOTE — Discharge Instructions (Signed)

## 2023-09-18 ENCOUNTER — Encounter: Payer: Self-pay | Admitting: Ophthalmology

## 2023-09-18 ENCOUNTER — Ambulatory Visit
Admission: RE | Admit: 2023-09-18 | Discharge: 2023-09-18 | Disposition: A | Discharge status: Home / Self Care | Attending: Ophthalmology | Admitting: Ophthalmology

## 2023-09-18 ENCOUNTER — Ambulatory Visit: Payer: Self-pay | Admitting: Anesthesiology

## 2023-09-18 ENCOUNTER — Encounter
Admission: RE | Disposition: A | Discharge status: Home / Self Care | Payer: Self-pay | Source: Home / Self Care | Attending: Ophthalmology

## 2023-09-18 ENCOUNTER — Other Ambulatory Visit: Payer: Self-pay

## 2023-09-18 DIAGNOSIS — F419 Anxiety disorder, unspecified: Secondary | ICD-10-CM | POA: Insufficient documentation

## 2023-09-18 DIAGNOSIS — H2511 Age-related nuclear cataract, right eye: Secondary | ICD-10-CM | POA: Insufficient documentation

## 2023-09-18 DIAGNOSIS — E1136 Type 2 diabetes mellitus with diabetic cataract: Secondary | ICD-10-CM | POA: Diagnosis not present

## 2023-09-18 DIAGNOSIS — F32A Depression, unspecified: Secondary | ICD-10-CM | POA: Insufficient documentation

## 2023-09-18 DIAGNOSIS — G4730 Sleep apnea, unspecified: Secondary | ICD-10-CM | POA: Insufficient documentation

## 2023-09-18 DIAGNOSIS — E039 Hypothyroidism, unspecified: Secondary | ICD-10-CM | POA: Insufficient documentation

## 2023-09-18 DIAGNOSIS — Z79890 Hormone replacement therapy (postmenopausal): Secondary | ICD-10-CM | POA: Diagnosis not present

## 2023-09-18 DIAGNOSIS — K219 Gastro-esophageal reflux disease without esophagitis: Secondary | ICD-10-CM | POA: Insufficient documentation

## 2023-09-18 DIAGNOSIS — Z7984 Long term (current) use of oral hypoglycemic drugs: Secondary | ICD-10-CM | POA: Insufficient documentation

## 2023-09-18 HISTORY — DX: Personal history of other diseases of the digestive system: Z87.19

## 2023-09-18 HISTORY — PX: CATARACT EXTRACTION W/PHACO: SHX586

## 2023-09-18 HISTORY — DX: Noninfective gastroenteritis and colitis, unspecified: K52.9

## 2023-09-18 HISTORY — DX: Presence of dental prosthetic device (complete) (partial): Z97.2

## 2023-09-18 HISTORY — DX: Obstructive sleep apnea (adult) (pediatric): G47.33

## 2023-09-18 HISTORY — DX: Chronic fatigue, unspecified: R53.82

## 2023-09-18 HISTORY — DX: Other long term (current) drug therapy: Z79.899

## 2023-09-18 HISTORY — DX: Other specified diseases of pancreas: K86.89

## 2023-09-18 LAB — GLUCOSE, CAPILLARY: Glucose-Capillary: 80 mg/dL (ref 70–99)

## 2023-09-18 SURGERY — PHACOEMULSIFICATION, CATARACT, WITH IOL INSERTION
Anesthesia: Monitor Anesthesia Care | Site: Eye | Laterality: Right

## 2023-09-18 MED ORDER — MIDAZOLAM HCL 2 MG/2ML IJ SOLN
INTRAMUSCULAR | Status: DC | PRN
  Administered 2023-09-18: 2 mg via INTRAVENOUS

## 2023-09-18 MED ORDER — BRIMONIDINE TARTRATE-TIMOLOL 0.2-0.5 % OP SOLN
OPHTHALMIC | Status: DC | PRN
  Administered 2023-09-18: 1 [drp] via OPHTHALMIC

## 2023-09-18 MED ORDER — LIDOCAINE HCL (PF) 2 % IJ SOLN
INTRAOCULAR | Status: DC | PRN
  Administered 2023-09-18: 2 mL

## 2023-09-18 MED ORDER — TETRACAINE HCL 0.5 % OP SOLN
OPHTHALMIC | Status: AC
  Filled 2023-09-18: qty 4

## 2023-09-18 MED ORDER — ARMC OPHTHALMIC DILATING DROPS
OPHTHALMIC | Status: AC
  Filled 2023-09-18: qty 0.5

## 2023-09-18 MED ORDER — FENTANYL CITRATE (PF) 100 MCG/2ML IJ SOLN
INTRAMUSCULAR | Status: DC | PRN
  Administered 2023-09-18: 50 ug via INTRAVENOUS

## 2023-09-18 MED ORDER — TETRACAINE HCL 0.5 % OP SOLN
1.0000 [drp] | OPHTHALMIC | Status: DC | PRN
  Administered 2023-09-18 (×3): 1 [drp] via OPHTHALMIC

## 2023-09-18 MED ORDER — SIGHTPATH DOSE#1 BSS IO SOLN
INTRAOCULAR | Status: DC | PRN
  Administered 2023-09-18: 15 mL via INTRAOCULAR

## 2023-09-18 MED ORDER — ARMC OPHTHALMIC DILATING DROPS
1.0000 | OPHTHALMIC | Status: DC | PRN
  Administered 2023-09-18 (×3): 1 via OPHTHALMIC

## 2023-09-18 MED ORDER — MOXIFLOXACIN HCL 0.5 % OP SOLN
OPHTHALMIC | Status: DC | PRN
  Administered 2023-09-18: .2 mL via OPHTHALMIC

## 2023-09-18 MED ORDER — SIGHTPATH DOSE#1 BSS IO SOLN
INTRAOCULAR | Status: DC | PRN
  Administered 2023-09-18: 76 mL via OPHTHALMIC

## 2023-09-18 MED ORDER — FENTANYL CITRATE (PF) 100 MCG/2ML IJ SOLN
INTRAMUSCULAR | Status: AC
  Filled 2023-09-18: qty 2

## 2023-09-18 MED ORDER — MIDAZOLAM HCL 2 MG/2ML IJ SOLN
INTRAMUSCULAR | Status: AC
  Filled 2023-09-18: qty 2

## 2023-09-18 MED ORDER — SIGHTPATH DOSE#1 NA CHONDROIT SULF-NA HYALURON 40-17 MG/ML IO SOLN
INTRAOCULAR | Status: DC | PRN
  Administered 2023-09-18: 1 mL via INTRAOCULAR

## 2023-09-18 SURGICAL SUPPLY — 12 items
CATARACT SUITE SIGHTPATH (MISCELLANEOUS) ×1 IMPLANT
CYSTOTOME ANGL RVRS SHRT 25G (CUTTER) ×1 IMPLANT
CYSTOTOME ANGL RVRS SHRT 25GA (CUTTER) ×1 IMPLANT
FEE CATARACT SUITE SIGHTPATH (MISCELLANEOUS) ×1 IMPLANT
GLOVE BIOGEL PI IND STRL 8 (GLOVE) ×1 IMPLANT
GLOVE SURG LX STRL 8.0 MICRO (GLOVE) ×1 IMPLANT
GLOVE SURG PROTEXIS BL SZ6.5 (GLOVE) ×1 IMPLANT
GLOVE SURG SYN 6.5 PF PI BL (GLOVE) ×1 IMPLANT
LENS IOL TECNIS EYHANCE 14.5 (Intraocular Lens) IMPLANT
NDL FILTER BLUNT 18X1 1/2 (NEEDLE) ×1 IMPLANT
NEEDLE FILTER BLUNT 18X1 1/2 (NEEDLE) ×1 IMPLANT
SYR 3ML LL SCALE MARK (SYRINGE) ×1 IMPLANT

## 2023-09-18 NOTE — Transfer of Care (Signed)
 Immediate Anesthesia Transfer of Care Note  Patient: April Leblanc  Procedure(s) Performed: PHACOEMULSIFICATION, CATARACT, WITH IOL INSERTION 5.78 00:58.9 (Right: Eye)  Patient Location: PACU  Anesthesia Type: MAC  Level of Consciousness: awake, alert  and patient cooperative  Airway and Oxygen Therapy: Patient Spontanous Breathing and Patient connected to supplemental oxygen  Post-op Assessment: Post-op Vital signs reviewed, Patient's Cardiovascular Status Stable, Respiratory Function Stable, Patent Airway and No signs of Nausea or vomiting  Post-op Vital Signs: Reviewed and stable  Complications: No notable events documented.

## 2023-09-18 NOTE — Op Note (Signed)
 PREOPERATIVE DIAGNOSIS:  Nuclear sclerotic cataract of the right eye.   POSTOPERATIVE DIAGNOSIS:  Right Eye Cataract   OPERATIVE PROCEDURE:ORPROCALL@   SURGEON:  Clair Crews, MD.   ANESTHESIA:  Anesthesiologist: Emilie Harden, MD CRNA: Jahoo, Sonia, CRNA  1.      Managed anesthesia care. 2.      0.48ml of Shugarcaine was instilled in the eye following the paracentesis.   COMPLICATIONS:  None.   TECHNIQUE:   Stop and chop   DESCRIPTION OF PROCEDURE:  The patient was examined and consented in the preoperative holding area where the aforementioned topical anesthesia was applied to the right eye and then brought back to the Operating Room where the right eye was prepped and draped in the usual sterile ophthalmic fashion and a lid speculum was placed. A paracentesis was created with the side port blade and the anterior chamber was filled with viscoelastic. A near clear corneal incision was performed with the steel keratome. A continuous curvilinear capsulorrhexis was performed with a cystotome followed by the capsulorrhexis forceps. Hydrodissection and hydrodelineation were carried out with BSS on a blunt cannula. The lens was removed in a stop and chop  technique and the remaining cortical material was removed with the irrigation-aspiration handpiece. The capsular bag was inflated with viscoelastic and the Technis [id number]  lens was placed in the capsular bag without complication. The remaining viscoelastic was removed from the eye with the irrigation-aspiration handpiece. The wounds were hydrated. The anterior chamber was flushed with BSS and the eye was inflated to physiologic pressure. 0.1ml of Vigamox was placed in the anterior chamber. The wounds were found to be water tight. The eye was dressed with Combigan. The patient was given protective glasses to wear throughout the day and a shield with which to sleep tonight. The patient was also given drops with which to begin a drop regimen today  and will follow-up with me in one day. Implant Name Type Inv. Item Serial No. Manufacturer Lot No. LRB No. Used Action  LENS IOL TECNIS EYHANCE 14.5 - [id number] Intraocular Lens LENS IOL TECNIS EYHANCE 14.5 [id number] [employer]  Right 1 Implanted   Procedure(s): PHACOEMULSIFICATION, CATARACT, WITH IOL INSERTION 5.78 00:58.9 (Right)  Electronically signed: Clair Crews 09/18/2023 9:09 AM

## 2023-09-18 NOTE — Anesthesia Postprocedure Evaluation (Signed)
 Anesthesia Post Note  Patient: April Leblanc  Procedure(s) Performed: PHACOEMULSIFICATION, CATARACT, WITH IOL INSERTION 5.78 00:58.9 (Right: Eye)  Patient location during evaluation: PACU Anesthesia Type: MAC Level of consciousness: awake and alert Pain management: pain level controlled Vital Signs Assessment: post-procedure vital signs reviewed and stable Respiratory status: spontaneous breathing, nonlabored ventilation, respiratory function stable and patient connected to nasal cannula oxygen Cardiovascular status: stable and blood pressure returned to baseline Postop Assessment: no apparent nausea or vomiting Anesthetic complications: no   No notable events documented.   Last Vitals:  Vitals:   09/18/23 0911 09/18/23 0917  BP: 129/74 131/69  Pulse:  65  Resp:  10  Temp: (!) 36.3 C (!) 36.3 C  SpO2:  98%    Last Pain:  Vitals:   09/18/23 0917  TempSrc:   PainSc: 0-No pain                 Emilie Harden

## 2023-09-18 NOTE — H&P (Signed)
 [facility]   Primary Care Physician:  Damian Duke, MD Ophthalmologist: Dr.Kaleen Rochette  Pre-Procedure History & Physical: HPI:  April Leblanc is a 73 y.o. female here for cataract surgery.   Past Medical History:  Diagnosis Date   Anemia    Ankle sprain 03/2017   Anxiety    Arthritis    Breast cancer (HCC) 2018   Breast cancer (HCC) 03/2017   Chronic diarrhea    Chronic fatigue    Cognitive impairment 12/13/2022   Depression    Diabetes mellitus without complication (HCC)    Fracture of distal end of radius 03/2017   left   Genetic testing 05/04/2017   Multi-Cancer panel (83 genes) @ [facility] - No pathogenic mutations detected   GERD (gastroesophageal reflux disease)    Headache    History of hiatal hernia    History of left breast cancer 12/13/2022   Hyperlipidemia    Hypothyroidism    OSA on CPAP    Pancreatic insufficiency    Personal history of radiation therapy    Pneumonia    Polypharmacy    Pre-diabetes 03/2017   follows healthy diabetic diet   S/P repair of paraesophageal hernia    Sleep apnea    has not used cpap in a long time   Vitamin B 12 deficiency    Wears dentures    partial lower    Past Surgical History:  Procedure Laterality Date   ABDOMINAL HYSTERECTOMY  1983   APPENDECTOMY  1977   when she had c-section   BREAST BIOPSY Left 03/27/2017   INVASIVE MAMMARY CARCINOMA Grade 1 UIG   BREAST LUMPECTOMY Left 04/13/2017   INVASIVE MAMMARY CARCINOMA/ DCIS, CLEAR MARGINS, NEG. LN   CESAREAN SECTION     COLONOSCOPY     COLONOSCOPY WITH PROPOFOL  N/A 02/25/2020   Procedure: COLONOSCOPY WITH PROPOFOL ;  Surgeon: Selena Daily, MD;  Location: [facility] ENDOSCOPY;  Service: Gastroenterology;  Laterality: N/A;   DILATION AND CURETTAGE OF UTERUS     ESOPHAGOGASTRODUODENOSCOPY (EGD) WITH PROPOFOL  N/A 02/05/2015   Procedure: ESOPHAGOGASTRODUODENOSCOPY (EGD) WITH PROPOFOL ;  Surgeon: Luella Sager, MD;  Location: [facility] ENDOSCOPY;   Service: Endoscopy;  Laterality: N/A;   ESOPHAGOGASTRODUODENOSCOPY (EGD) WITH PROPOFOL  N/A 02/25/2020   Procedure: ESOPHAGOGASTRODUODENOSCOPY (EGD) WITH PROPOFOL ;  Surgeon: Selena Daily, MD;  Location: [facility] ENDOSCOPY;  Service: Gastroenterology;  Laterality: N/A;   PARTIAL MASTECTOMY WITH NEEDLE LOCALIZATION Left 04/13/2017   Procedure: PARTIAL MASTECTOMY WITH NEEDLE LOCALIZATION;  Surgeon: Benancio Bracket, MD;  Location: [facility] ORS;  Service: General;  Laterality: Left;   PATELLA FRACTURE SURGERY Left 1991   screws in place   SAVORY DILATION N/A 02/05/2015   Procedure: SAVORY DILATION;  Surgeon: Luella Sager, MD;  Location: [facility] ENDOSCOPY;  Service: Endoscopy;  Laterality: N/A;   SENTINEL NODE BIOPSY Left 04/13/2017   Procedure: SENTINEL NODE BIOPSY;  Surgeon: Benancio Bracket, MD;  Location: [facility] ORS;  Service: General;  Laterality: Left;   SINUS SURGERY WITH INSTATRAK     x 2   XI ROBOTIC ASSISTED PARAESOPHAGEAL HERNIA REPAIR N/A 08/18/2021   Procedure: XI ROBOTIC ASSISTED PARAESOPHAGEAL HERNIA REPAIR with RNFA to assist;  Surgeon: Alben Alma, MD;  Location: [facility] ORS;  Service: General;  Laterality: N/A;    Prior to Admission medications   Medication Sig Start Date End Date Taking? Authorizing Provider  acetaminophen  (TYLENOL ) 500 MG tablet Take 1,000 mg by mouth every 6 (six) hours as needed for moderate pain or headache.  Yes [provider]  amphetamine-dextroamphetamine (ADDERALL) 20 MG tablet Take 20 mg by mouth every morning. 11/25/21  Yes [provider]  ARIPiprazole  (ABILIFY ) 15 MG tablet Take 15 mg by mouth daily. 11/16/21  Yes [provider]  atorvastatin (LIPITOR) 40 MG tablet Take 40 mg by mouth daily.    Yes [provider]  calcium carbonate (OSCAL) 1500 (600 Ca) MG TABS tablet Take by mouth 2 (two) times daily with a meal.   Yes [provider]  Collagen-Vitamin C-Biotin (COLLAGEN PO) Take by mouth.   Yes  [provider]  fluticasone (FLONASE) 50 MCG/ACT nasal spray Place 2 sprays into both nostrils daily as needed for allergies.   Yes [provider]  ibuprofen (ADVIL) 200 MG tablet Take 200 mg by mouth every 6 (six) hours as needed.   Yes [provider]  levocetirizine (XYZAL) 5 MG tablet Take 5 mg by mouth every evening.  06/21/18  Yes [provider]  levothyroxine  (SYNTHROID ) 150 MCG tablet Take by mouth. 11/08/21  Yes [provider]  lipase/protease/amylase (CREON ) 36000 UNITS CPEP capsule Take 2 capsules with the first bite of each meal and 1 capsule with the first bite of each snack 04/09/23  Yes Brigitte Canard, PA-C  LORazepam  (ATIVAN ) 0.5 MG tablet Take 0.5 mg by mouth 3 (three) times daily.   Yes [provider]  metFORMIN (GLUCOPHAGE-XR) 500 MG 24 hr tablet Take 1,000 mg by mouth daily with breakfast. 08/23/20  Yes [provider]  mirabegron ER (MYRBETRIQ) 25 MG TB24 tablet Take 25 mg by mouth daily.   Yes [provider]  mupirocin ointment (BACTROBAN) 2 % Apply 1 application topically 3 (three) times daily as needed. 08/23/20  Yes [provider]  ondansetron  (ZOFRAN -ODT) 4 MG disintegrating tablet Take 1 tablet (4 mg total) by mouth every 6 (six) hours as needed for nausea. 08/19/21  Yes Schulz, Zachary R, PA-C  Probiotic Product (ALIGN PO) Take by mouth daily.   Yes [provider]  traZODone  (DESYREL ) 150 MG tablet Take 225 mg by mouth at bedtime. 06/30/20  Yes [provider]  vitamin B-12 (CYANOCOBALAMIN) 1000 MCG tablet Take 1,000 mcg by mouth daily.   Yes [provider]  vitamin D3 (CHOLECALCIFEROL) 25 MCG tablet Take 1,000 Units by mouth daily.   Yes [provider]  vortioxetine HBr (TRINTELLIX) 20 MG TABS tablet Take 20 mg by mouth daily.   Yes [provider]    Allergies as of 09/03/2023 - Review Complete 04/09/2023  Allergen Reaction Noted    Amoxicillin Hives, Itching, and Other (See Comments) 12/19/2011   Erythromycin Hives and Itching 12/19/2011   Sulfa antibiotics Hives and Itching 12/19/2011   Contrast media [iodinated contrast media] Rash 02/04/2015   Morphine Nausea And Vomiting and Other (See Comments) 12/19/2011    Family History  Problem Relation Age of Onset   Breast cancer Maternal Aunt 27       deceased 75s   Breast cancer Cousin 53       daughter of mat aunt with breast cancer   Breast cancer Cousin 72       daughter of mat aunt with breast cancer   Deep vein thrombosis Mother    Heart disease Mother    Stroke Mother    Heart disease Father    Parkinson's disease Father    Heart disease Sister    Stroke Sister    Skin cancer Brother    Heart disease Brother  Alzheimer's disease Brother    Lung cancer Paternal Uncle        deceased 66s   Heart disease Brother    Alzheimer's disease Brother    Heart disease Brother    Alzheimer's disease Brother    Cancer Brother        unclear primary; currently 44s   Cirrhosis Sister    Alzheimer's disease Sister    Kidney cancer Son 77       nephrectomy @ [facility]    Social History   Socioeconomic History   Marital status: Married    Spouse name: John   Number of children: Not on file   Years of education: Not on file   Highest education level: Not on file  Occupational History   Not on file  Tobacco Use   Smoking status: Never    Passive exposure: Past   Smokeless tobacco: Never  Vaping Use   Vaping status: Never Used  Substance and Sexual Activity   Alcohol use: No   Drug use: No   Sexual activity: Not Currently  Other Topics Concern   Not on file  Social History Narrative   2 grandchildren lives with them, they have full custody   Social Drivers of Corporate investment banker Strain: Low Risk  (06/04/2023)   Received from [facility]   Overall Financial Resource Strain (CARDIA)    Difficulty of Paying Living Expenses: Not  very hard  Food Insecurity: No Food Insecurity (06/04/2023)   Received from [facility]   Hunger Vital Sign    Worried About Running Out of Food in the Last Year: Never true    Ran Out of Food in the Last Year: Never true  Transportation Needs: No Transportation Needs (06/04/2023)   Received from [facility] - Transportation    In the past 12 months, has lack of transportation kept you from medical appointments or from getting medications?: No    Lack of Transportation (Non-Medical): No  Physical Activity: Not on file  Stress: Not on file  Social Connections: Not on file  Intimate Partner Violence: Not on file    Review of Systems: See HPI, otherwise negative ROS  Physical Exam: BP (!) 140/72   Pulse 68   Temp (!) 97.2 F (36.2 C) (Temporal)   Resp 10   Ht 5\' 7"  (1.702 m)   Wt 66.2 kg   SpO2 99%   BMI 22.87 kg/m  General:   Alert, cooperative. Head:  Normocephalic and atraumatic. Respiratory:  Normal work of breathing. Cardiovascular:  NAD  Impression/Plan: April Leblanc is here for cataract surgery.  Risks, benefits, limitations, and alternatives regarding cataract surgery have been reviewed with the patient.  Questions have been answered.  All parties agreeable.   Clair Crews, MD  09/18/2023, 8:42 AM

## 2023-09-25 NOTE — Anesthesia Preprocedure Evaluation (Addendum)
 Anesthesia Evaluation  Patient identified by MRN, date of birth, ID band Patient awake    Reviewed: Allergy & Precautions, H&P , NPO status , Patient's Chart, lab work & pertinent test results  Airway Mallampati: III  TM Distance: >3 FB Neck ROM: Full    Dental  (+) Partial Lower   Pulmonary neg pulmonary ROS, sleep apnea , pneumonia   Pulmonary exam normal breath sounds clear to auscultation       Cardiovascular negative cardio ROS Normal cardiovascular exam Rhythm:Regular Rate:Normal     Neuro/Psych  Headaches PSYCHIATRIC DISORDERS Anxiety Depression    negative neurological ROS  negative psych ROS   GI/Hepatic negative GI ROS, Neg liver ROS, hiatal hernia,GERD  ,,(+) Hepatitis -  Endo/Other  diabetesHypothyroidism    Renal/GU negative Renal ROS  negative genitourinary   Musculoskeletal negative musculoskeletal ROS (+) Arthritis ,    Abdominal   Peds negative pediatric ROS (+)  Hematology negative hematology ROS (+) Blood dyscrasia, anemia   Anesthesia Other Findings Previous cataract surgery 09-18-23 Dr. Aldo Amble   Hypothyroidism  GERD (gastroesophageal reflux disease) Headache  Depression Vitamin B 12 deficiency  Hyperlipidemia Anemia  Sleep apnea Arthritis  Fracture of distal end of radius Pre-diabetes  Ankle sprain Genetic testing  Breast cancer (HCC) Breast cancer (HCC)  Personal history of radiation therapy Diabetes mellitus without complication (HCC)  Pneumonia History of hiatal hernia  Anxiety History of left breast cancer  Cognitive impairment Denies dentures, has partial lower Polypharmacy OSA on CPAP  Pancreatic insufficiency Chronic diarrhea  S/P repair of paraesophageal hernia Chronic fatigue      Reproductive/Obstetrics negative OB ROS                              Anesthesia Physical Anesthesia Plan  ASA: 2  Anesthesia Plan: MAC   Post-op Pain  Management:    Induction: Intravenous  PONV Risk Score and Plan:   Airway Management Planned: Natural Airway and Nasal Cannula  Additional Equipment:   Intra-op Plan:   Post-operative Plan:   Informed Consent: I have reviewed the patients History and Physical, chart, labs and discussed the procedure including the risks, benefits and alternatives for the proposed anesthesia with the patient or authorized representative who has indicated his/her understanding and acceptance.     Dental Advisory Given  Plan Discussed with: Anesthesiologist, CRNA and Surgeon  Anesthesia Plan Comments: (Patient consented for risks of anesthesia including but not limited to:  - adverse reactions to medications - damage to eyes, teeth, lips or other oral mucosa - nerve damage due to positioning  - sore throat or hoarseness - Damage to heart, brain, nerves, lungs, other parts of body or loss of life  Patient voiced understanding and assent.)         Anesthesia Quick Evaluation

## 2023-09-28 NOTE — Discharge Instructions (Signed)

## 2023-10-02 ENCOUNTER — Ambulatory Visit
Admission: RE | Admit: 2023-10-02 | Discharge: 2023-10-02 | Disposition: A | Discharge status: Home / Self Care | Attending: Ophthalmology | Admitting: Ophthalmology

## 2023-10-02 ENCOUNTER — Encounter: Payer: Self-pay | Admitting: Ophthalmology

## 2023-10-02 ENCOUNTER — Other Ambulatory Visit: Payer: Self-pay

## 2023-10-02 ENCOUNTER — Ambulatory Visit: Payer: Self-pay | Admitting: Anesthesiology

## 2023-10-02 ENCOUNTER — Encounter
Admission: RE | Disposition: A | Discharge status: Home / Self Care | Payer: Self-pay | Source: Home / Self Care | Attending: Ophthalmology

## 2023-10-02 DIAGNOSIS — Z7984 Long term (current) use of oral hypoglycemic drugs: Secondary | ICD-10-CM | POA: Insufficient documentation

## 2023-10-02 DIAGNOSIS — H2512 Age-related nuclear cataract, left eye: Secondary | ICD-10-CM | POA: Insufficient documentation

## 2023-10-02 DIAGNOSIS — E1136 Type 2 diabetes mellitus with diabetic cataract: Secondary | ICD-10-CM | POA: Diagnosis present

## 2023-10-02 DIAGNOSIS — F419 Anxiety disorder, unspecified: Secondary | ICD-10-CM | POA: Diagnosis not present

## 2023-10-02 DIAGNOSIS — F32A Depression, unspecified: Secondary | ICD-10-CM | POA: Insufficient documentation

## 2023-10-02 DIAGNOSIS — E039 Hypothyroidism, unspecified: Secondary | ICD-10-CM | POA: Insufficient documentation

## 2023-10-02 DIAGNOSIS — G4733 Obstructive sleep apnea (adult) (pediatric): Secondary | ICD-10-CM | POA: Insufficient documentation

## 2023-10-02 DIAGNOSIS — M1990 Unspecified osteoarthritis, unspecified site: Secondary | ICD-10-CM | POA: Diagnosis not present

## 2023-10-02 HISTORY — PX: CATARACT EXTRACTION W/PHACO: SHX586

## 2023-10-02 LAB — GLUCOSE, CAPILLARY: Glucose-Capillary: 82 mg/dL (ref 70–99)

## 2023-10-02 SURGERY — PHACOEMULSIFICATION, CATARACT, WITH IOL INSERTION
Anesthesia: Monitor Anesthesia Care | Site: Eye | Laterality: Left

## 2023-10-02 MED ORDER — BRIMONIDINE TARTRATE-TIMOLOL 0.2-0.5 % OP SOLN
OPHTHALMIC | Status: DC | PRN
  Administered 2023-10-02: 1 [drp] via OPHTHALMIC

## 2023-10-02 MED ORDER — ARMC OPHTHALMIC DILATING DROPS
OPHTHALMIC | Status: AC
  Filled 2023-10-02: qty 0.5

## 2023-10-02 MED ORDER — SIGHTPATH DOSE#1 BSS IO SOLN
INTRAOCULAR | Status: DC | PRN
  Administered 2023-10-02: 15 mL via INTRAOCULAR

## 2023-10-02 MED ORDER — MOXIFLOXACIN HCL 0.5 % OP SOLN
OPHTHALMIC | Status: DC | PRN
  Administered 2023-10-02: .2 mL via OPHTHALMIC

## 2023-10-02 MED ORDER — LACTATED RINGERS IV SOLN: INTRAVENOUS | Status: DC

## 2023-10-02 MED ORDER — MIDAZOLAM HCL 2 MG/2ML IJ SOLN
INTRAMUSCULAR | Status: DC | PRN
Start: 2023-10-02 — End: 2023-10-02
  Administered 2023-10-02: 2 mg via INTRAVENOUS

## 2023-10-02 MED ORDER — LIDOCAINE HCL (PF) 2 % IJ SOLN
INTRAOCULAR | Status: DC | PRN
  Administered 2023-10-02: 2 mL

## 2023-10-02 MED ORDER — ARMC OPHTHALMIC DILATING DROPS
1.0000 | OPHTHALMIC | Status: DC | PRN
  Administered 2023-10-02 (×3): 1 via OPHTHALMIC

## 2023-10-02 MED ORDER — SIGHTPATH DOSE#1 BSS IO SOLN
INTRAOCULAR | Status: DC | PRN
  Administered 2023-10-02: 68 mL via OPHTHALMIC

## 2023-10-02 MED ORDER — FENTANYL CITRATE (PF) 100 MCG/2ML IJ SOLN
INTRAMUSCULAR | Status: DC | PRN
  Administered 2023-10-02: 50 ug via INTRAVENOUS

## 2023-10-02 MED ORDER — MIDAZOLAM HCL 2 MG/2ML IJ SOLN
INTRAMUSCULAR | Status: AC
  Filled 2023-10-02: qty 2

## 2023-10-02 MED ORDER — SIGHTPATH DOSE#1 NA CHONDROIT SULF-NA HYALURON 40-17 MG/ML IO SOLN
INTRAOCULAR | Status: DC | PRN
  Administered 2023-10-02: 1 mL via INTRAOCULAR

## 2023-10-02 MED ORDER — TETRACAINE HCL 0.5 % OP SOLN
OPHTHALMIC | Status: AC
  Filled 2023-10-02: qty 4

## 2023-10-02 MED ORDER — TETRACAINE HCL 0.5 % OP SOLN
1.0000 [drp] | OPHTHALMIC | Status: DC | PRN
  Administered 2023-10-02 (×3): 1 [drp] via OPHTHALMIC

## 2023-10-02 MED ORDER — FENTANYL CITRATE (PF) 100 MCG/2ML IJ SOLN
INTRAMUSCULAR | Status: AC
Start: 2023-10-02 — End: ?
  Filled 2023-10-02: qty 2

## 2023-10-02 SURGICAL SUPPLY — 12 items
CATARACT SUITE SIGHTPATH (MISCELLANEOUS) ×1 IMPLANT
CYSTOTOME ANGL RVRS SHRT 25G (CUTTER) ×1 IMPLANT
CYSTOTOME ANGL RVRS SHRT 25GA (CUTTER) ×1 IMPLANT
FEE CATARACT SUITE SIGHTPATH (MISCELLANEOUS) ×1 IMPLANT
GLOVE BIOGEL PI IND STRL 8 (GLOVE) ×1 IMPLANT
GLOVE SURG LX STRL 8.0 MICRO (GLOVE) ×1 IMPLANT
GLOVE SURG PROTEXIS BL SZ6.5 (GLOVE) ×1 IMPLANT
GLOVE SURG SYN 6.5 PF PI BL (GLOVE) ×1 IMPLANT
LENS IOL TECNIS EYHANCE 16.0 (Intraocular Lens) IMPLANT
NDL FILTER BLUNT 18X1 1/2 (NEEDLE) ×1 IMPLANT
NEEDLE FILTER BLUNT 18X1 1/2 (NEEDLE) ×1 IMPLANT
SYR 3ML LL SCALE MARK (SYRINGE) ×1 IMPLANT

## 2023-10-02 NOTE — Op Note (Signed)
 PREOPERATIVE DIAGNOSIS:  Nuclear sclerotic cataract of the left eye.   POSTOPERATIVE DIAGNOSIS:  Nuclear sclerotic cataract of the left eye.   OPERATIVE PROCEDURE:ORPROCALL@   SURGEON:  Clair Crews, MD.   ANESTHESIA:  Anesthesiologist: Emilie Harden, MD CRNA: Sherrlyn Dolores, CRNA  1.      Managed anesthesia care. 2.     0.39ml of Shugarcaine was instilled following the paracentesis   COMPLICATIONS:  None.   TECHNIQUE:   Stop and chop   DESCRIPTION OF PROCEDURE:  The patient was examined and consented in the preoperative holding area where the aforementioned topical anesthesia was applied to the left eye and then brought back to the Operating Room where the left eye was prepped and draped in the usual sterile ophthalmic fashion and a lid speculum was placed. A paracentesis was created with the side port blade and the anterior chamber was filled with viscoelastic. A near clear corneal incision was performed with the steel keratome. A continuous curvilinear capsulorrhexis was performed with a cystotome followed by the capsulorrhexis forceps. Hydrodissection and hydrodelineation were carried out with BSS on a blunt cannula. The lens was removed in a stop and chop  technique and the remaining cortical material was removed with the irrigation-aspiration handpiece. The capsular bag was inflated with viscoelastic and the Technis [id number] lens was placed in the capsular bag without complication. The remaining viscoelastic was removed from the eye with the irrigation-aspiration handpiece. The wounds were hydrated. The anterior chamber was flushed with BSS and the eye was inflated to physiologic pressure. 0.71ml Vigamox  was placed in the anterior chamber. The wounds were found to be water tight. The eye was dressed with Combigan . The patient was given protective glasses to wear throughout the day and a shield with which to sleep tonight. The patient was also given drops with which to begin a drop regimen  today and will follow-up with me in one day. Implant Name Type Inv. Item Serial No. Manufacturer Lot No. LRB No. Used Action  LENS IOL TECNIS EYHANCE 16.0 - [id number] Intraocular Lens LENS IOL TECNIS EYHANCE 16.0 [id number] [employer]  Left 1 Implanted    Procedure(s) with comments: PHACOEMULSIFICATION, CATARACT, WITH IOL INSERTION (Left) - 10.30 1:06.3  Electronically signed: Clair Crews 10/02/2023 10:57 AM

## 2023-10-02 NOTE — H&P (Signed)
 [facility]   Primary Care Physician:  Damian Duke, MD Ophthalmologist: Dr. Merrell Abate  Pre-Procedure History & Physical: HPI:  April Leblanc is a 73 y.o. female here for cataract surgery.   Past Medical History:  Diagnosis Date   Anemia    Ankle sprain 03/2017   Anxiety    Arthritis    Breast cancer (HCC) 2018   Breast cancer (HCC) 03/2017   Chronic diarrhea    Chronic fatigue    Cognitive impairment 12/13/2022   Depression    Diabetes mellitus without complication (HCC)    Fracture of distal end of radius 03/2017   left   Genetic testing 05/04/2017   Multi-Cancer panel (83 genes) @ [facility] - No pathogenic mutations detected   GERD (gastroesophageal reflux disease)    Headache    History of hiatal hernia    History of left breast cancer 12/13/2022   Hyperlipidemia    Hypothyroidism    OSA on CPAP    Pancreatic insufficiency    Personal history of radiation therapy    Pneumonia    Polypharmacy    Pre-diabetes 03/2017   follows healthy diabetic diet   S/P repair of paraesophageal hernia    Sleep apnea    has not used cpap in a long time   Vitamin B 12 deficiency    Wears dentures    partial lower    Past Surgical History:  Procedure Laterality Date   ABDOMINAL HYSTERECTOMY  1983   APPENDECTOMY  1977   when she had c-section   BREAST BIOPSY Left 03/27/2017   INVASIVE MAMMARY CARCINOMA Grade 1 UIG   BREAST LUMPECTOMY Left 04/13/2017   INVASIVE MAMMARY CARCINOMA/ DCIS, CLEAR MARGINS, NEG. LN   CATARACT EXTRACTION W/PHACO Right 09/18/2023   Procedure: PHACOEMULSIFICATION, CATARACT, WITH IOL INSERTION 5.78 00:58.9;  Surgeon: Clair Crews, MD;  Location: [facility];  Service: Ophthalmology;  Laterality: Right;   CESAREAN SECTION     COLONOSCOPY     COLONOSCOPY WITH PROPOFOL  N/A 02/25/2020   Procedure: COLONOSCOPY WITH PROPOFOL ;  Surgeon: Selena Daily, MD;  Location: [facility] ENDOSCOPY;  Service: Gastroenterology;  Laterality: N/A;    DILATION AND CURETTAGE OF UTERUS     ESOPHAGOGASTRODUODENOSCOPY (EGD) WITH PROPOFOL  N/A 02/05/2015   Procedure: ESOPHAGOGASTRODUODENOSCOPY (EGD) WITH PROPOFOL ;  Surgeon: Luella Sager, MD;  Location: [facility] ENDOSCOPY;  Service: Endoscopy;  Laterality: N/A;   ESOPHAGOGASTRODUODENOSCOPY (EGD) WITH PROPOFOL  N/A 02/25/2020   Procedure: ESOPHAGOGASTRODUODENOSCOPY (EGD) WITH PROPOFOL ;  Surgeon: Selena Daily, MD;  Location: [facility] ENDOSCOPY;  Service: Gastroenterology;  Laterality: N/A;   PARTIAL MASTECTOMY WITH NEEDLE LOCALIZATION Left 04/13/2017   Procedure: PARTIAL MASTECTOMY WITH NEEDLE LOCALIZATION;  Surgeon: Benancio Bracket, MD;  Location: [facility] ORS;  Service: General;  Laterality: Left;   PATELLA FRACTURE SURGERY Left 1991   screws in place   SAVORY DILATION N/A 02/05/2015   Procedure: SAVORY DILATION;  Surgeon: Luella Sager, MD;  Location: [facility] ENDOSCOPY;  Service: Endoscopy;  Laterality: N/A;   SENTINEL NODE BIOPSY Left 04/13/2017   Procedure: SENTINEL NODE BIOPSY;  Surgeon: Benancio Bracket, MD;  Location: [facility] ORS;  Service: General;  Laterality: Left;   SINUS SURGERY WITH INSTATRAK     x 2   XI ROBOTIC ASSISTED PARAESOPHAGEAL HERNIA REPAIR N/A 08/18/2021   Procedure: XI ROBOTIC ASSISTED PARAESOPHAGEAL HERNIA REPAIR with RNFA to assist;  Surgeon: Alben Alma, MD;  Location: [facility] ORS;  Service: General;  Laterality: N/A;    Prior to Admission medications  Medication Sig Start Date End Date Taking? Authorizing Provider  acetaminophen  (TYLENOL ) 500 MG tablet Take 1,000 mg by mouth every 6 (six) hours as needed for moderate pain or headache.   Yes [provider]  amphetamine-dextroamphetamine (ADDERALL) 20 MG tablet Take 20 mg by mouth every morning. 11/25/21  Yes [provider]  ARIPiprazole  (ABILIFY ) 15 MG tablet Take 15 mg by mouth daily. 11/16/21  Yes [provider]  atorvastatin (LIPITOR) 40 MG tablet Take 40 mg by mouth daily.     Yes [provider]  calcium carbonate (OSCAL) 1500 (600 Ca) MG TABS tablet Take by mouth 2 (two) times daily with a meal.   Yes [provider]  Collagen-Vitamin C-Biotin (COLLAGEN PO) Take by mouth.   Yes [provider]  fluticasone (FLONASE) 50 MCG/ACT nasal spray Place 2 sprays into both nostrils daily as needed for allergies.   Yes [provider]  ibuprofen (ADVIL) 200 MG tablet Take 200 mg by mouth every 6 (six) hours as needed.   Yes [provider]  levocetirizine (XYZAL) 5 MG tablet Take 5 mg by mouth every evening.  06/21/18  Yes [provider]  levothyroxine  (SYNTHROID ) 150 MCG tablet Take by mouth. 11/08/21  Yes [provider]  lipase/protease/amylase (CREON ) 36000 UNITS CPEP capsule Take 2 capsules with the first bite of each meal and 1 capsule with the first bite of each snack 04/09/23  Yes Brigitte Canard, PA-C  LORazepam  (ATIVAN ) 0.5 MG tablet Take 0.5 mg by mouth 3 (three) times daily.   Yes [provider]  metFORMIN (GLUCOPHAGE-XR) 500 MG 24 hr tablet Take 1,000 mg by mouth daily with breakfast. 08/23/20  Yes [provider]  mirabegron ER (MYRBETRIQ) 25 MG TB24 tablet Take 25 mg by mouth daily.   Yes [provider]  mupirocin ointment (BACTROBAN) 2 % Apply 1 application topically 3 (three) times daily as needed. 08/23/20  Yes [provider]  ondansetron  (ZOFRAN -ODT) 4 MG disintegrating tablet Take 1 tablet (4 mg total) by mouth every 6 (six) hours as needed for nausea. 08/19/21  Yes Schulz, Zachary R, PA-C  Probiotic Product (ALIGN PO) Take by mouth daily.   Yes [provider]  traZODone  (DESYREL ) 150 MG tablet Take 225 mg by mouth at bedtime. 06/30/20  Yes [provider]  vitamin B-12 (CYANOCOBALAMIN) 1000 MCG tablet Take 1,000 mcg by mouth daily.   Yes [provider]  vitamin D3 (CHOLECALCIFEROL) 25 MCG tablet Take 1,000 Units by mouth daily.   Yes  [provider]  vortioxetine HBr (TRINTELLIX) 20 MG TABS tablet Take 20 mg by mouth daily.   Yes [provider]    Allergies as of 09/03/2023 - Review Complete 04/09/2023  Allergen Reaction Noted   Amoxicillin Hives, Itching, and Other (See Comments) 12/19/2011   Erythromycin Hives and Itching 12/19/2011   Sulfa antibiotics Hives and Itching 12/19/2011   Contrast media [iodinated contrast media] Rash 02/04/2015   Morphine Nausea And Vomiting and Other (See Comments) 12/19/2011    Family History  Problem Relation Age of Onset   Breast cancer Maternal Aunt 58       deceased 13s   Breast cancer Cousin 30       daughter of mat aunt with breast cancer   Breast cancer Cousin 13       daughter of mat aunt with breast cancer   Deep vein thrombosis Mother    Heart disease Mother    Stroke Mother  Heart disease Father    Parkinson's disease Father    Heart disease Sister    Stroke Sister    Skin cancer Brother    Heart disease Brother    Alzheimer's disease Brother    Lung cancer Paternal Uncle        deceased 38s   Heart disease Brother    Alzheimer's disease Brother    Heart disease Brother    Alzheimer's disease Brother    Cancer Brother        unclear primary; currently 71s   Cirrhosis Sister    Alzheimer's disease Sister    Kidney cancer Son 36       nephrectomy @ [facility]    Social History   Socioeconomic History   Marital status: Married    Spouse name: John   Number of children: Not on file   Years of education: Not on file   Highest education level: Not on file  Occupational History   Not on file  Tobacco Use   Smoking status: Never    Passive exposure: Past   Smokeless tobacco: Never  Vaping Use   Vaping status: Never Used  Substance and Sexual Activity   Alcohol use: No   Drug use: No   Sexual activity: Not Currently  Other Topics Concern   Not on file  Social History Narrative   2 grandchildren lives with them, they have full  custody   Social Drivers of Corporate investment banker Strain: Low Risk  (06/04/2023)   Received [facility]   Overall Financial Resource Strain (CARDIA)    Difficulty of Paying Living Expenses: Not very hard  Food Insecurity: No Food Insecurity (06/04/2023)   Received from [facility]   Hunger Vital Sign    Worried About Running Out of Food in the Last Year: Never true    Ran Out of Food in the Last Year: Never true  Transportation Needs: No Transportation Needs (06/04/2023)   Received from [facility] - [facility]    In the past 12 months, has lack of transportation kept you from medical appointments or from getting medications?: No    Lack of Transportation (Non-Medical): No  Physical Activity: Not on file  Stress: Not on file  Social Connections: Not on file  Intimate Partner Violence: Not on file    Review of Systems: See HPI, otherwise negative ROS  Physical Exam: BP (!) 145/79   Pulse 65   Temp (!) 97 F (36.1 C) (Temporal)   Resp 16   Ht 5\' 7"  (1.702 m)   Wt 66.2 kg   SpO2 99%   BMI 22.87 kg/m  General:   Alert, cooperative. Head:  Normocephalic and atraumatic. Respiratory:  Normal work of breathing. Cardiovascular:  NAD  Impression/Plan: April Leblanc is here for cataract surgery.  Risks, benefits, limitations, and alternatives regarding cataract surgery have been reviewed with the patient.  Questions have been answered.  All parties agreeable.   Clair Crews, MD  10/02/2023, 10:07 AM

## 2023-10-02 NOTE — Anesthesia Postprocedure Evaluation (Signed)
 Anesthesia Post Note  Patient: April Leblanc  Procedure(s) Performed: PHACOEMULSIFICATION, CATARACT, WITH IOL INSERTION (Left: Eye)  Patient location during evaluation: PACU Anesthesia Type: MAC Level of consciousness: awake and alert Pain management: pain level controlled Vital Signs Assessment: post-procedure vital signs reviewed and stable Respiratory status: spontaneous breathing, nonlabored ventilation, respiratory function stable and patient connected to nasal cannula oxygen Cardiovascular status: stable and blood pressure returned to baseline Postop Assessment: no apparent nausea or vomiting Anesthetic complications: no   No notable events documented.   Last Vitals:  Vitals:   10/02/23 1100 10/02/23 1104  BP: 120/73 120/76  Pulse: 72 68  Resp: 11 13  Temp: (!) 36.1 C (!) 36.1 C  SpO2: 97% 96%    Last Pain:  Vitals:   10/02/23 1104  TempSrc:   PainSc: 0-No pain                 Shihab States C Reynol Arnone

## 2023-10-02 NOTE — Transfer of Care (Signed)
 Immediate Anesthesia Transfer of Care Note  Patient: April Leblanc  Procedure(s) Performed: PHACOEMULSIFICATION, CATARACT, WITH IOL INSERTION (Left: Eye)  Patient Location: PACU  Anesthesia Type: MAC  Level of Consciousness: awake, alert  and patient cooperative  Airway and Oxygen Therapy: Patient Spontanous Breathing and Patient connected to supplemental oxygen  Post-op Assessment: Post-op Vital signs reviewed, Patient's Cardiovascular Status Stable, Respiratory Function Stable, Patent Airway and No signs of Nausea or vomiting  Post-op Vital Signs: Reviewed and stable  Complications: No notable events documented.

## 2023-10-02 NOTE — Op Note (Signed)
 PREOPERATIVE DIAGNOSIS:  Nuclear sclerotic cataract of the left eye.   POSTOPERATIVE DIAGNOSIS:  Nuclear sclerotic cataract of the left eye.   OPERATIVE PROCEDURE:ORPROCALL@   SURGEON:  Clair Crews, MD.   ANESTHESIA:  Anesthesiologist: Emilie Harden, MD CRNA: Sherrlyn Dolores, CRNA  1.      Managed anesthesia care. 2.     0.64ml of Shugarcaine was instilled following the paracentesis   COMPLICATIONS:  None.   TECHNIQUE:   Stop and chop   DESCRIPTION OF PROCEDURE:  The patient was examined and consented in the preoperative holding area where the aforementioned topical anesthesia was applied to the left eye and then brought back to the Operating Room where the left eye was prepped and draped in the usual sterile ophthalmic fashion and a lid speculum was placed. A paracentesis was created with the side port blade and the anterior chamber was filled with viscoelastic. A near clear corneal incision was performed with the steel keratome. A continuous curvilinear capsulorrhexis was performed with a cystotome followed by the capsulorrhexis forceps. Hydrodissection and hydrodelineation were carried out with BSS on a blunt cannula. The lens was removed in a stop and chop  technique and the remaining cortical material was removed with the irrigation-aspiration handpiece. The capsular bag was inflated with viscoelastic and the Technis ZCB00 lens was placed in the capsular bag without complication. The remaining viscoelastic was removed from the eye with the irrigation-aspiration handpiece. The wounds were hydrated. The anterior chamber was flushed with BSS and the eye was inflated to physiologic pressure. 0.67ml Vigamox  was placed in the anterior chamber. The wounds were found to be water tight. The eye was dressed with Combigan . The patient was given protective glasses to wear throughout the day and a shield with which to sleep tonight. The patient was also given drops with which to begin a drop regimen  today and will follow-up with me in one day. * No implants in log *  Procedure(s): PHACOEMULSIFICATION, CATARACT, WITH IOL INSERTION (Left)  Electronically signed: Clair Crews 10/02/2023 10:30 AM

## 2023-10-30 ENCOUNTER — Emergency Department
Admission: EM | Admit: 2023-10-30 | Discharge: 2023-10-30 | Disposition: A | Discharge status: Home / Self Care | Attending: Emergency Medicine | Admitting: Emergency Medicine

## 2023-10-30 ENCOUNTER — Other Ambulatory Visit: Payer: Self-pay

## 2023-10-30 DIAGNOSIS — W57XXXA Bitten or stung by nonvenomous insect and other nonvenomous arthropods, initial encounter: Secondary | ICD-10-CM | POA: Diagnosis not present

## 2023-10-30 DIAGNOSIS — S50861A Insect bite (nonvenomous) of right forearm, initial encounter: Secondary | ICD-10-CM | POA: Diagnosis present

## 2023-10-30 DIAGNOSIS — S80862A Insect bite (nonvenomous), left lower leg, initial encounter: Secondary | ICD-10-CM | POA: Diagnosis not present

## 2023-10-30 NOTE — ED Triage Notes (Signed)
 Pt to ED via POV c/o insect bites to hands and legs Pt was bit by some black ants this morning. Took benadryl  but is still having some itching and swelling.

## 2023-10-30 NOTE — Discharge Instructions (Addendum)
 Use cortisone cream as we discussed for itching

## 2023-10-31 NOTE — ED Provider Notes (Signed)
   [facility] Provider Note    Event Date/Time   First MD Initiated Contact with Patient 10/30/23 2318     (approximate)   History   Insect Bite  HPI  April Leblanc is a 73 y.o. female who presents with complaints of insect bites.  Patient reports she was bit by black ants this morning at 8 AM.  She and family became concerned when she had swelling around 2 of them and mild erythema.  She reports they are itchy.     Physical Exam   Triage Vital Signs: ED Triage Vitals  Encounter Vitals Group     BP 10/30/23 2020 (!) 157/82     Girls Systolic BP Percentile --      Girls Diastolic BP Percentile --      Boys Systolic BP Percentile --      Boys Diastolic BP Percentile --      Pulse Rate 10/30/23 2020 77     Resp 10/30/23 2020 18     Temp 10/30/23 2020 98.2 F (36.8 C)     Temp Source 10/30/23 2020 Oral     SpO2 10/30/23 2020 97 %     Weight 10/30/23 2019 64.4 kg (142 lb)     Height 10/30/23 2019 1.702 m (5' 7)     Head Circumference --      Peak Flow --      Pain Score 10/30/23 2019 0     Pain Loc --      Pain Education --      Exclude from Growth Chart --     Most recent vital signs: Vitals:   10/30/23 2020 10/30/23 2338  BP: (!) 157/82 (!) 142/74  Pulse: 77 74  Resp: 18 15  Temp: 98.2 F (36.8 C) 97.8 F (36.6 C)  SpO2: 97% 100%     General: Awake, no distress.  CV:  Good peripheral perfusion.  Resp:  Normal effort.  Abd:  No distention.  Other:  Right dorsal forearm, area of erythema with mild swelling approximately 2 to 3 cm, similar area left leg   ED Results / Procedures / Treatments   Labs (all labs ordered are listed, but only abnormal results are displayed) Labs Reviewed - No data to display   EKG     RADIOLOGY     PROCEDURES:  Critical Care performed:   Procedures   MEDICATIONS ORDERED IN ED: Medications - No data to display   IMPRESSION / MDM / ASSESSMENT AND PLAN / ED COURSE  I reviewed  the triage vital signs and the nursing notes. Patient's presentation is most consistent with acute, uncomplicated illness.  Presentation exam consistent with acute inflammatory reaction to insect bites, not consistent with infection given time course.  Not an allergic reaction.,  Recommend supportive care, cortisone cream        FINAL CLINICAL IMPRESSION(S) / ED DIAGNOSES   Final diagnoses:  Insect bite, unspecified site, initial encounter     Rx / DC Orders   ED Discharge Orders     None        Note:  This document was prepared using Dragon voice recognition software and may include unintentional dictation errors.   Arlander Charleston, MD 10/31/23 [phone]

## 2023-11-07 ENCOUNTER — Inpatient Hospital Stay: Payer: Medicare Other | Attending: Oncology | Admitting: Oncology

## 2023-11-07 ENCOUNTER — Encounter: Payer: Self-pay | Admitting: Oncology

## 2023-11-07 NOTE — Progress Notes (Deleted)
 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  6 mm tumor with negative lymph nodes that was ER greater than 90% positive, PR 1% positive and HER2 equivocal by IHC and negative by FISH.  Patient did not adjuvant chemotherapy and went on to receive adjuvant radiation treatment and started taking Arimidex  in march 2019. She gets reclast  for osteopenia Q18 months.  Patient completed 5 years of Arimidex  in July 2024    Interval history- ***  ECOG PS- *** Pain scale- *** Opioid associated constipation- ***  Review of systems- Review of Systems  Constitutional:  Negative for chills, fever, malaise/fatigue and weight loss.  HENT:  Negative for congestion, ear discharge and nosebleeds.   Eyes:  Negative for blurred vision.  Respiratory:  Negative for cough, hemoptysis, sputum production, shortness of breath and wheezing.   Cardiovascular:  Negative for chest pain, palpitations, orthopnea and claudication.  Gastrointestinal:  Negative for abdominal pain, blood in stool, constipation, diarrhea, heartburn, melena, nausea and vomiting.  Genitourinary:  Negative for dysuria, flank pain, frequency, hematuria and urgency.  Musculoskeletal:  Negative for back pain, joint pain and myalgias.  Skin:  Negative for  rash.  Neurological:  Negative for dizziness, tingling, focal weakness, seizures, weakness and headaches.  Endo/Heme/Allergies:  Does not bruise/bleed easily.  Psychiatric/Behavioral:  Negative for depression and suicidal ideas. The patient does not have insomnia.       Allergies  Allergen Reactions   Amoxicillin Hives, Itching and Other (See Comments)    Has patient had a PCN reaction causing immediate rash, facial/tongue/throat swelling, SOB or lightheadedness with hypotension: No Has patient had a PCN reaction causing severe rash involving mucus membranes or skin necrosis: No Has patient had a PCN reaction that required hospitalization: No Has patient had a PCN reaction occurring within the last 10 years: No If all of the above answers are NO, then may proceed with Cephalosporin use.    Erythromycin Hives and Itching   Sulfa Antibiotics Hives and Itching   Contrast Media [Iodinated Contrast Media] Rash    Red rash and itching. Topical iodine not a problem.   Morphine Nausea And Vomiting and Other (See Comments)    Pretty severe vomiting. Can take hydrocodone  and oxycodone        Past Medical History:  Diagnosis Date   Anemia    Ankle sprain 03/2017   Anxiety    Arthritis    Breast cancer (HCC) 2018   Breast cancer (HCC) 03/2017   Chronic diarrhea    Chronic fatigue    Cognitive impairment 12/13/2022   Depression    Diabetes mellitus without complication (HCC)    Fracture of distal end of radius 03/2017   left   Genetic testing 05/04/2017   Multi-Cancer panel (83 genes) @ [facility] - No pathogenic mutations detected   GERD (gastroesophageal  reflux disease)    Headache    History of hiatal hernia    History of left breast cancer 12/13/2022   Hyperlipidemia    Hypothyroidism    OSA on CPAP    Pancreatic insufficiency    Personal history of radiation therapy    Pneumonia    Polypharmacy    Pre-diabetes 03/2017   follows healthy diabetic diet   S/P repair of  paraesophageal hernia    Sleep apnea    has not used cpap in a long time   Vitamin B 12 deficiency    Wears dentures    partial lower     Past Surgical History:  Procedure Laterality Date   ABDOMINAL HYSTERECTOMY  1983   APPENDECTOMY  1977   when she had c-section   BREAST BIOPSY Left 03/27/2017   INVASIVE MAMMARY CARCINOMA Grade 1 UIG   BREAST LUMPECTOMY Left 04/13/2017   INVASIVE MAMMARY CARCINOMA/ DCIS, CLEAR MARGINS, NEG. LN   CATARACT EXTRACTION W/PHACO Right 09/18/2023   Procedure: PHACOEMULSIFICATION, CATARACT, WITH IOL INSERTION 5.78 00:58.9;  Surgeon: Jaye Fallow, MD;  Location: [facility];  Service: Ophthalmology;  Laterality: Right;   CATARACT EXTRACTION W/PHACO Left 10/02/2023   Procedure: PHACOEMULSIFICATION, CATARACT, WITH IOL INSERTION;  Surgeon: Jaye Fallow, MD;  Location: [facility];  Service: Ophthalmology;  Laterality: Left;  10.30 1:06.3   CESAREAN SECTION     COLONOSCOPY     COLONOSCOPY WITH PROPOFOL  N/A 02/25/2020   Procedure: COLONOSCOPY WITH PROPOFOL ;  Surgeon: Unk Corinn Skiff, MD;  Location: [facility] END[facility]COPY;  Service: Gastroenterology;  Laterality: N/A;   DILATION AND CURETTAGE OF UTERUS     ESOPHAGOGASTRODUODENOSCOPY (EGD) WITH PROPOFOL  N/A 02/05/2015   Procedure: ESOPHAGOGASTRODUODENOSCOPY (EGD) WITH PROPOFOL ;  Surgeon: Donnice Vaughn Manes, MD;  Location: [facility] ENDOSCOPY;  Service: Endoscopy;  Laterality: N/A;   ESOPHAGOGASTRODUODENOSCOPY (EGD) WITH PROPOFOL  N/A 02/25/2020   Procedure: ESOPHAGOGASTRODUODENOSCOPY (EGD) WITH PROPOFOL ;  Surgeon: Unk Corinn Skiff, MD;  Location: [facility] ENDOSCOPY;  Service: Gastroenterology;  Laterality: N/A;   PARTIAL MASTECTOMY WITH NEEDLE LOCALIZATION Left 04/13/2017   Procedure: PARTIAL MASTECTOMY WITH NEEDLE LOCALIZATION;  Surgeon: Claudene Larinda Bolder, MD;  Location: [facility] ORS;  Service: General;  Laterality: Left;   PATELLA FRACTURE SURGERY Left 1991   screws in place   SAVORY DILATION N/A  02/05/2015   Procedure: SAVORY DILATION;  Surgeon: Donnice Vaughn Manes, MD;  Location: [facility] ENDOSCOPY;  Service: Endoscopy;  Laterality: N/A;   SENTINEL NODE BIOPSY Left 04/13/2017   Procedure: SENTINEL NODE BIOPSY;  Surgeon: Claudene Larinda Bolder, MD;  Location: [facility] ORS;  Service: General;  Laterality: Left;   SINUS SURGERY WITH INSTATRAK     x 2   XI ROBOTIC ASSISTED PARAESOPHAGEAL HERNIA REPAIR N/A 08/18/2021   Procedure: XI ROBOTIC ASSISTED PARAESOPHAGEAL HERNIA REPAIR with RNFA to assist;  Surgeon: Jordis Laneta FALCON, MD;  Location: [facility] ORS;  Service: General;  Laterality: N/A;    Social History   Socioeconomic History   Marital status: Married    Spouse name: John   Number of children: Not on file   Years of education: Not on file   Highest education level: Not on file  Occupational History   Not on file  Tobacco Use   Smoking status: Never    Passive exposure: Past   Smokeless tobacco: Never  Vaping Use   Vaping status: Never Used  Substance and Sexual Activity   Alcohol use: No   Drug use: No   Sexual activity: Not Currently  Other Topics  Concern   Not on file  Social History Narrative   2 grandchildren lives with them, they have full custody   Social Drivers of Corporate investment banker Strain: Low Risk  (06/04/2023)   Received from [facility]   Overall Financial Resource Strain (CARDIA)    Difficulty of Paying Living Expenses: Not very hard  Food Insecurity: No Food Insecurity (06/04/2023)   Received from [facility]   Hunger Vital Sign    Within the past 12 months, you worried that your food would run out before you got the money to buy more.: Never true    Within the past 12 months, the food you bought just didn't last and you didn't have money to get more.: Never true  Transportation Needs: No Transportation Needs (06/04/2023)   Received from [facility] - [facility]    In the past 12 months,  has lack of transportation kept you from medical appointments or from getting medications?: No    Lack of Transportation (Non-Medical): No  Physical Activity: Not on file  Stress: Not on file  Social Connections: Not on file  Intimate Partner Violence: Not on file    Family History  Problem Relation Age of Onset   Breast cancer Maternal Aunt 35       deceased 40s   Breast cancer Cousin 34       daughter of mat aunt with breast cancer   Breast cancer Cousin 49       daughter of mat aunt with breast cancer   Deep vein thrombosis Mother    Heart disease Mother    Stroke Mother    Heart disease Father    Parkinson's disease Father    Heart disease Sister    Stroke Sister    Skin cancer Brother    Heart disease Brother    Alzheimer's disease Brother    Lung cancer Paternal Uncle        deceased 60s   Heart disease Brother    Alzheimer's disease Brother    Heart disease Brother    Alzheimer's disease Brother    Cancer Brother        unclear primary; currently 53s   Cirrhosis Sister    Alzheimer's disease Sister    Kidney cancer Son 37       nephrectomy @ [facility]     Current Outpatient Medications:    acetaminophen  (TYLENOL ) 500 MG tablet, Take 1,000 mg by mouth every 6 (six) hours as needed for moderate pain or headache., Disp: , Rfl:    amphetamine-dextroamphetamine (ADDERALL) 20 MG tablet, Take 20 mg by mouth every morning., Disp: , Rfl:    ARIPiprazole  (ABILIFY ) 15 MG tablet, Take 15 mg by mouth daily., Disp: , Rfl:    atorvastatin (LIPITOR) 40 MG tablet, Take 40 mg by mouth daily. , Disp: , Rfl:    calcium carbonate (OSCAL) 1500 (600 Ca) MG TABS tablet, Take by mouth 2 (two) times daily with a meal., Disp: , Rfl:    Collagen-Vitamin C-Biotin (COLLAGEN PO), Take by mouth., Disp: , Rfl:    fluticasone (FLONASE) 50 MCG/ACT nasal spray, Place 2 sprays into both nostrils daily as needed for allergies., Disp: , Rfl:    ibuprofen (ADVIL) 200 MG tablet, Take 200 mg by mouth every  6 (six) hours as needed., Disp: , Rfl:    levocetirizine (XYZAL) 5 MG tablet, Take 5 mg by mouth every evening. , Disp: ,  Rfl:    levothyroxine  (SYNTHROID ) 150 MCG tablet, Take by mouth., Disp: , Rfl:    lipase/protease/amylase (CREON ) 36000 UNITS CPEP capsule, Take 2 capsules with the first bite of each meal and 1 capsule with the first bite of each snack, Disp: 720 capsule, Rfl: 3   LORazepam  (ATIVAN ) 0.5 MG tablet, Take 0.5 mg by mouth 3 (three) times daily., Disp: , Rfl:    metFORMIN (GLUCOPHAGE-XR) 500 MG 24 hr tablet, Take 1,000 mg by mouth daily with breakfast., Disp: , Rfl:    mirabegron ER (MYRBETRIQ) 25 MG TB24 tablet, Take 25 mg by mouth daily., Disp: , Rfl:    mupirocin ointment (BACTROBAN) 2 %, Apply 1 application topically 3 (three) times daily as needed., Disp: , Rfl:    ondansetron  (ZOFRAN -ODT) 4 MG disintegrating tablet, Take 1 tablet (4 mg total) by mouth every 6 (six) hours as needed for nausea., Disp: 20 tablet, Rfl: 0   Probiotic Product (ALIGN PO), Take by mouth daily., Disp: , Rfl:    traZODone  (DESYREL ) 150 MG tablet, Take 225 mg by mouth at bedtime., Disp: , Rfl:    vitamin B-12 (CYANOCOBALAMIN) 1000 MCG tablet, Take 1,000 mcg by mouth daily., Disp: , Rfl:    vitamin D3 (CHOLECALCIFEROL) 25 MCG tablet, Take 1,000 Units by mouth daily., Disp: , Rfl:    vortioxetine HBr (TRINTELLIX) 20 MG TABS tablet, Take 20 mg by mouth daily., Disp: , Rfl:   Physical exam: There were no vitals filed for this visit. Physical Exam Cardiovascular:     Rate and Rhythm: Normal rate and regular rhythm.     Heart sounds: Normal heart sounds.  Pulmonary:     Effort: Pulmonary effort is normal.     Breath sounds: Normal breath sounds.  Abdominal:     General: Bowel sounds are normal.     Palpations: Abdomen is soft.  Skin:    General: Skin is warm and dry.  Neurological:     Mental Status: She is alert and oriented to person, place, and time.      I have personally reviewed labs  listed below:    Latest Ref Rng & Units 11/07/2022    9:47 AM  CMP  Glucose 70 - 99 mg/dL 891   BUN 8 - 23 mg/dL 22   Creatinine 9.55 - 1.00 mg/dL 9.37   Sodium 864 - 854 mmol/L 139   Potassium 3.5 - 5.1 mmol/L 3.8   Chloride 98 - 111 mmol/L 102   CO2 22 - 32 mmol/L 28   Calcium 8.9 - 10.3 mg/dL 9.9       Latest Ref Rng & Units 08/19/2021    6:09 AM  CBC  WBC 4.0 - 10.5 K/uL 13.0   Hemoglobin 12.0 - 15.0 g/dL 88.1   Hematocrit 63.9 - 46.0 % 37.5   Platelets 150 - 400 K/uL 228       Assessment and plan- Patient is a 73 y.o. 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  This is a routine follow-up visit  Clinically patient is doing well with no concerning signs and symptoms of recurrence based on today's exam.  Her mammogram from April 2025 was unremarkable.  I will see her back in 1 year no labs.  Patient last received Reclast  in July 2024.  She has at least received 3 years  of bisphosphonates and I will plan to hold off on any further Reclast  at this time and give her a drug holiday.  I will plan to repeat her bone density scan in January 2026.   Visit Diagnosis 1. Encounter for follow-up surveillance of breast cancer      Dr. Annah Skene, MD, MPH [facility] at [facility] [phone] 11/07/2023 8:04 AM

## 2024-01-18 ENCOUNTER — Encounter: Payer: Self-pay | Admitting: Oncology
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
## 2024-01-21 ENCOUNTER — Ambulatory Visit (INDEPENDENT_AMBULATORY_CARE_PROVIDER_SITE_OTHER): Admitting: Internal Medicine

## 2024-01-21 VITALS — BP 138/85 | HR 78 | Resp 16 | Ht 67.0 in | Wt 145.0 lb

## 2024-01-21 DIAGNOSIS — R4189 Other symptoms and signs involving cognitive functions and awareness: Secondary | ICD-10-CM | POA: Diagnosis not present

## 2024-01-21 DIAGNOSIS — G4733 Obstructive sleep apnea (adult) (pediatric): Secondary | ICD-10-CM | POA: Diagnosis not present
